# Patient Record
Sex: Female | Born: 1975 | Race: White | Hispanic: No | Smoking: Former smoker
Health system: Southern US, Community
[De-identification: ages and names within clinical notes are randomized; demographics above are authoritative.]

## PROBLEM LIST (undated history)

## (undated) DIAGNOSIS — F32A Depression, unspecified: Secondary | ICD-10-CM

## (undated) DIAGNOSIS — G4730 Sleep apnea, unspecified: Secondary | ICD-10-CM

## (undated) DIAGNOSIS — C439 Malignant melanoma of skin, unspecified: Secondary | ICD-10-CM

## (undated) DIAGNOSIS — E119 Type 2 diabetes mellitus without complications: Secondary | ICD-10-CM

## (undated) DIAGNOSIS — I10 Essential (primary) hypertension: Secondary | ICD-10-CM

## (undated) DIAGNOSIS — F329 Major depressive disorder, single episode, unspecified: Secondary | ICD-10-CM

## (undated) DIAGNOSIS — C801 Malignant (primary) neoplasm, unspecified: Secondary | ICD-10-CM

## (undated) HISTORY — DX: Malignant melanoma of skin, unspecified: C43.9

## (undated) HISTORY — PX: MELANOMA EXCISION WITH SENTINEL LYMPH NODE BIOPSY: SHX5267

## (undated) HISTORY — DX: Type 2 diabetes mellitus without complications: E11.9

## (undated) HISTORY — DX: Sleep apnea, unspecified: G47.30

## (undated) HISTORY — DX: Malignant (primary) neoplasm, unspecified: C80.1

## (undated) HISTORY — PX: CHOLECYSTECTOMY: SHX55

## (undated) MED FILL — Pembrolizumab IV Soln 100 MG/4ML (25 MG/ML): INTRAVENOUS | Qty: 8 | Status: AC

---

## 1999-08-16 ENCOUNTER — Emergency Department (HOSPITAL_COMMUNITY): Admission: EM | Admit: 1999-08-16 | Discharge: 1999-08-16 | Payer: Self-pay | Admitting: *Deleted

## 1999-08-29 ENCOUNTER — Emergency Department (HOSPITAL_COMMUNITY): Admission: EM | Admit: 1999-08-29 | Discharge: 1999-08-29 | Payer: Self-pay | Admitting: *Deleted

## 1999-09-05 ENCOUNTER — Inpatient Hospital Stay (HOSPITAL_COMMUNITY): Admission: EM | Admit: 1999-09-05 | Discharge: 1999-09-05 | Payer: Self-pay | Admitting: Emergency Medicine

## 1999-09-05 ENCOUNTER — Encounter (INDEPENDENT_AMBULATORY_CARE_PROVIDER_SITE_OTHER): Payer: Self-pay

## 2001-08-21 ENCOUNTER — Other Ambulatory Visit: Admission: RE | Admit: 2001-08-21 | Discharge: 2001-08-21 | Payer: Self-pay | Admitting: Obstetrics and Gynecology

## 2002-05-05 ENCOUNTER — Inpatient Hospital Stay (HOSPITAL_COMMUNITY): Admission: AD | Admit: 2002-05-05 | Discharge: 2002-05-05 | Payer: Self-pay | Admitting: Obstetrics and Gynecology

## 2002-07-23 ENCOUNTER — Inpatient Hospital Stay (HOSPITAL_COMMUNITY): Admission: AD | Admit: 2002-07-23 | Discharge: 2002-07-23 | Payer: Self-pay | Admitting: Obstetrics and Gynecology

## 2002-08-01 ENCOUNTER — Inpatient Hospital Stay (HOSPITAL_COMMUNITY): Admission: AD | Admit: 2002-08-01 | Discharge: 2002-08-01 | Payer: Self-pay | Admitting: Obstetrics and Gynecology

## 2002-08-05 ENCOUNTER — Inpatient Hospital Stay (HOSPITAL_COMMUNITY): Admission: AD | Admit: 2002-08-05 | Discharge: 2002-08-08 | Payer: Self-pay | Admitting: Obstetrics & Gynecology

## 2002-09-04 ENCOUNTER — Other Ambulatory Visit: Admission: RE | Admit: 2002-09-04 | Discharge: 2002-09-04 | Payer: Self-pay | Admitting: Obstetrics & Gynecology

## 2002-11-08 ENCOUNTER — Emergency Department (HOSPITAL_COMMUNITY): Admission: EM | Admit: 2002-11-08 | Discharge: 2002-11-08 | Payer: Self-pay | Admitting: Emergency Medicine

## 2002-11-08 ENCOUNTER — Encounter: Payer: Self-pay | Admitting: Emergency Medicine

## 2003-05-08 ENCOUNTER — Encounter: Payer: Self-pay | Admitting: Obstetrics and Gynecology

## 2003-05-08 ENCOUNTER — Ambulatory Visit (HOSPITAL_COMMUNITY): Admission: RE | Admit: 2003-05-08 | Discharge: 2003-05-08 | Payer: Self-pay | Admitting: Obstetrics and Gynecology

## 2003-08-26 ENCOUNTER — Inpatient Hospital Stay (HOSPITAL_COMMUNITY): Admission: AD | Admit: 2003-08-26 | Discharge: 2003-08-27 | Payer: Self-pay | Admitting: Obstetrics and Gynecology

## 2003-09-01 ENCOUNTER — Inpatient Hospital Stay (HOSPITAL_COMMUNITY): Admission: AD | Admit: 2003-09-01 | Discharge: 2003-09-01 | Payer: Self-pay | Admitting: Obstetrics and Gynecology

## 2003-09-02 ENCOUNTER — Ambulatory Visit (HOSPITAL_COMMUNITY): Admission: RE | Admit: 2003-09-02 | Discharge: 2003-09-02 | Payer: Self-pay | Admitting: Obstetrics and Gynecology

## 2003-09-11 ENCOUNTER — Inpatient Hospital Stay (HOSPITAL_COMMUNITY): Admission: AD | Admit: 2003-09-11 | Discharge: 2003-09-13 | Payer: Self-pay | Admitting: Obstetrics & Gynecology

## 2003-10-21 ENCOUNTER — Other Ambulatory Visit: Admission: RE | Admit: 2003-10-21 | Discharge: 2003-10-21 | Payer: Self-pay | Admitting: Obstetrics & Gynecology

## 2007-07-27 ENCOUNTER — Emergency Department (HOSPITAL_COMMUNITY): Admission: EM | Admit: 2007-07-27 | Discharge: 2007-07-27 | Payer: Self-pay | Admitting: Emergency Medicine

## 2008-09-27 ENCOUNTER — Ambulatory Visit: Payer: Self-pay | Admitting: *Deleted

## 2008-09-27 ENCOUNTER — Inpatient Hospital Stay (HOSPITAL_COMMUNITY): Admission: EM | Admit: 2008-09-27 | Discharge: 2008-09-28 | Payer: Self-pay | Admitting: Emergency Medicine

## 2008-10-01 ENCOUNTER — Ambulatory Visit: Payer: Self-pay

## 2008-10-29 ENCOUNTER — Encounter: Payer: Self-pay | Admitting: Cardiovascular Disease

## 2009-05-29 IMAGING — CR DG CHEST 2V
2 series · 2 of 2 positions shown · non-contrast
Comparison: None

CLINICAL DATA: Tightness and entire chest.  Pain in left arm,
fingers of both hands.  The patient is numb and tingling.
Shortness of breath when moving.  History of smoking, hypertension.

CHEST - 2 VIEW

[view not recorded (1 of 2)]
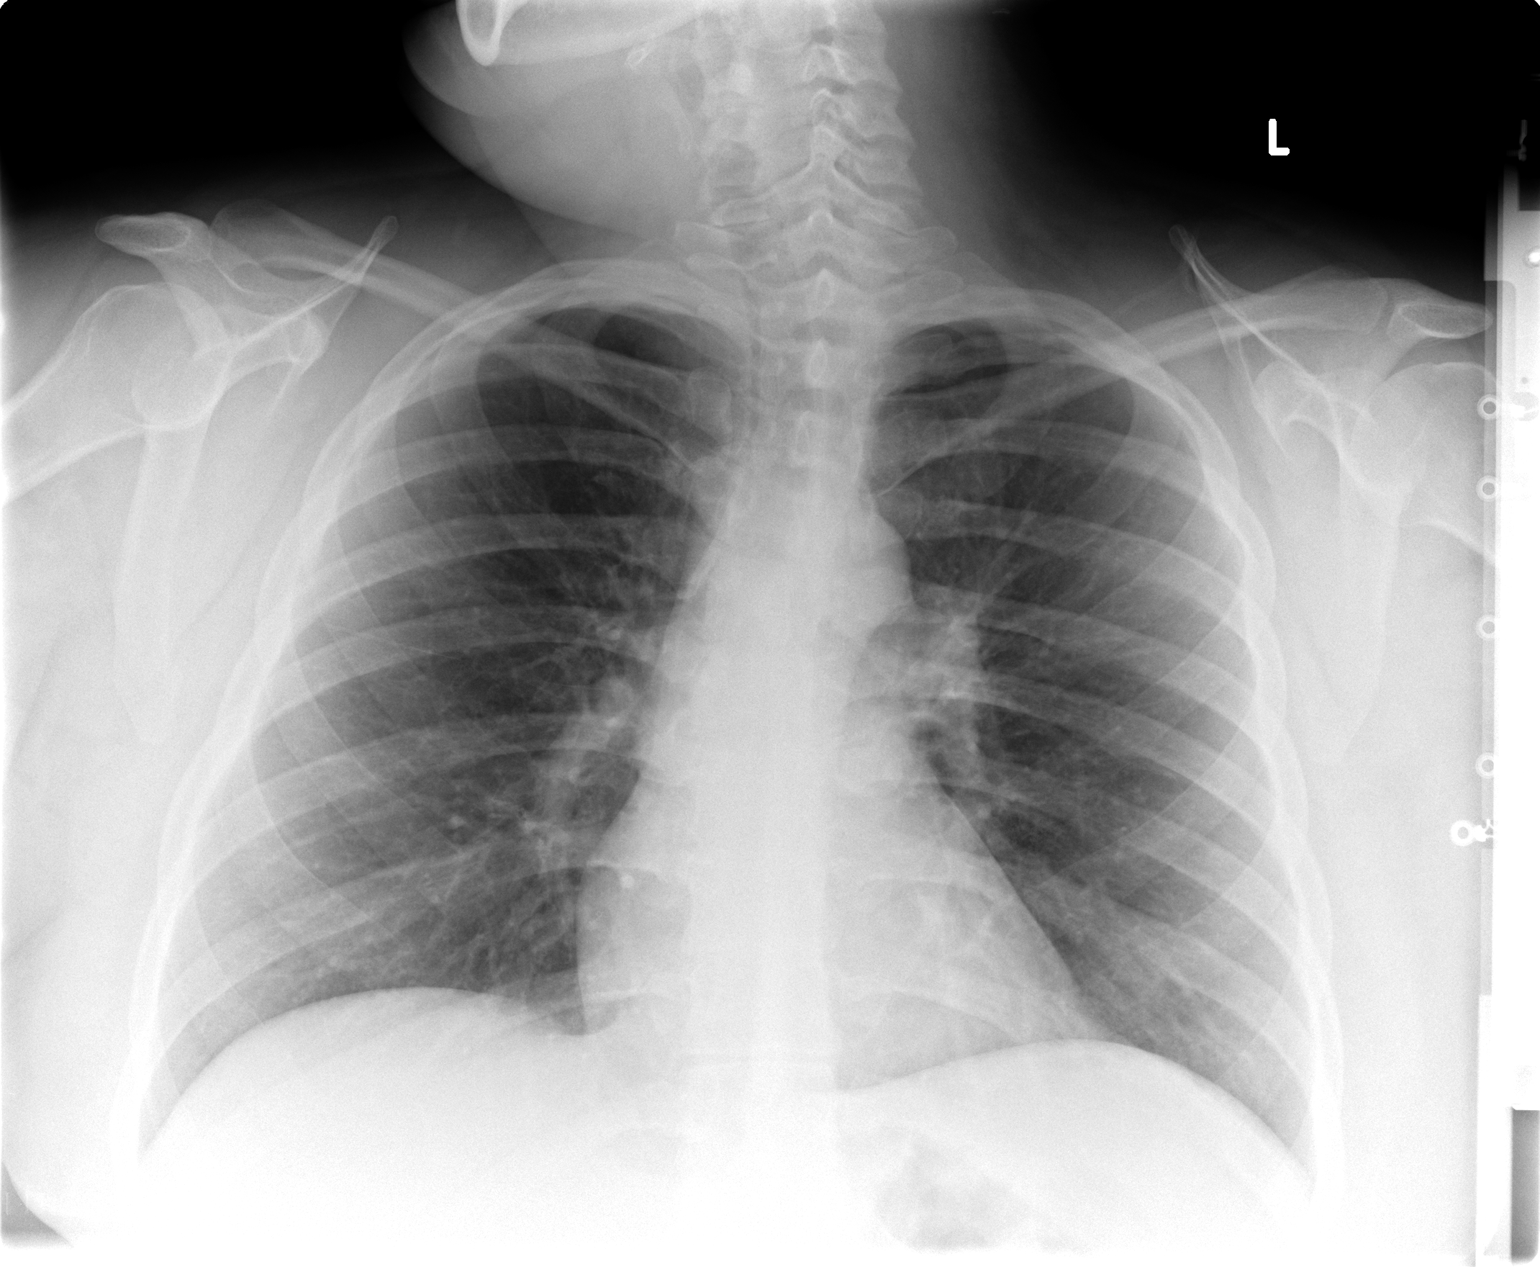

[view not recorded (2 of 2)]
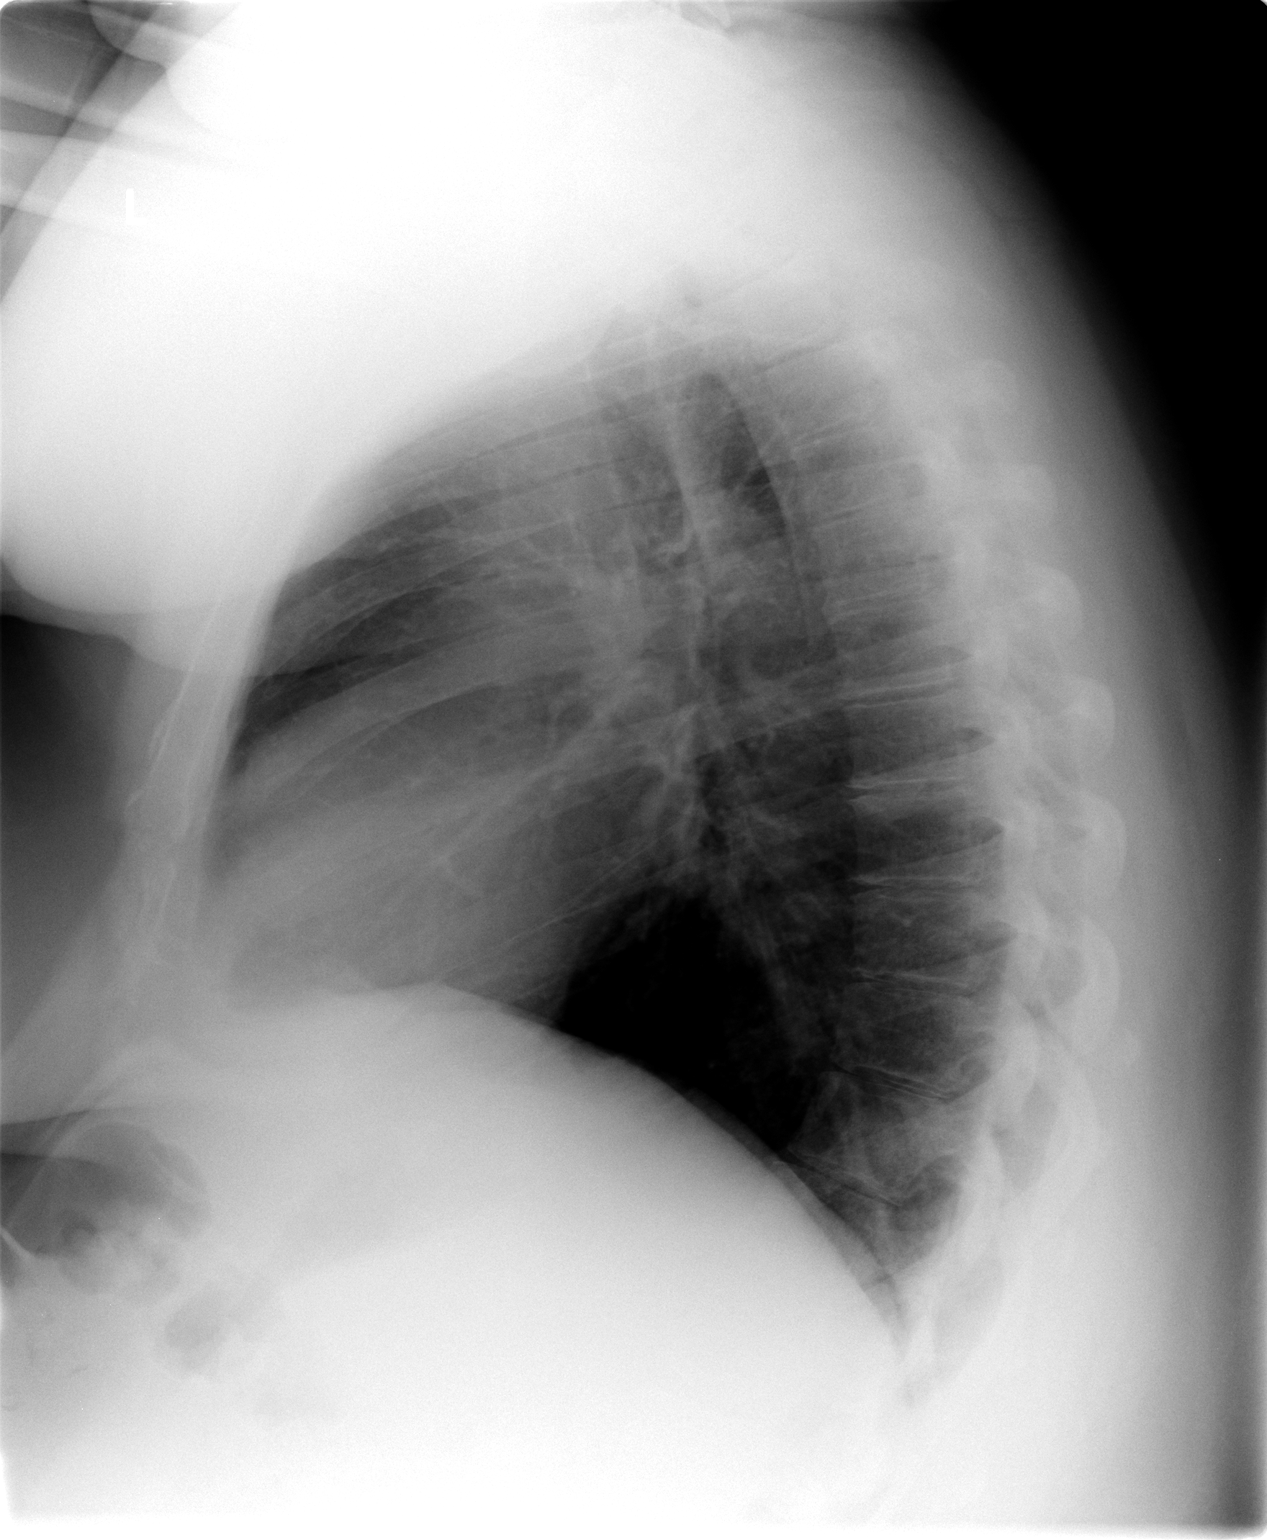

[2 of 2 positions shown; findings below may reference images not displayed]

FINDINGS: Cardiomediastinal silhouette is within normal limits.
The lungs are free of focal consolidations and pleural effusions.
Bony structures have a normal appearance.
IMPRESSION: No evidence for acute abnormality.

## 2010-12-04 ENCOUNTER — Emergency Department (HOSPITAL_COMMUNITY): Payer: Self-pay

## 2010-12-04 ENCOUNTER — Emergency Department (HOSPITAL_COMMUNITY)
Admission: EM | Admit: 2010-12-04 | Discharge: 2010-12-04 | Disposition: A | Discharge status: Home / Self Care | Payer: Self-pay | Attending: Emergency Medicine | Admitting: Emergency Medicine

## 2010-12-04 DIAGNOSIS — R05 Cough: Secondary | ICD-10-CM | POA: Insufficient documentation

## 2010-12-04 DIAGNOSIS — Z79899 Other long term (current) drug therapy: Secondary | ICD-10-CM | POA: Insufficient documentation

## 2010-12-04 DIAGNOSIS — F17200 Nicotine dependence, unspecified, uncomplicated: Secondary | ICD-10-CM | POA: Insufficient documentation

## 2010-12-04 DIAGNOSIS — R209 Unspecified disturbances of skin sensation: Secondary | ICD-10-CM | POA: Insufficient documentation

## 2010-12-04 DIAGNOSIS — I10 Essential (primary) hypertension: Secondary | ICD-10-CM | POA: Insufficient documentation

## 2010-12-04 DIAGNOSIS — R059 Cough, unspecified: Secondary | ICD-10-CM | POA: Insufficient documentation

## 2010-12-04 DIAGNOSIS — R079 Chest pain, unspecified: Secondary | ICD-10-CM | POA: Insufficient documentation

## 2010-12-04 LAB — POCT CARDIAC MARKERS
CKMB, poc: 1.1 ng/mL (ref 1.0–8.0)
Myoglobin, poc: 48.6 ng/mL (ref 12–200)
Troponin i, poc: <0.05 ng/mL (ref 0.00–0.09)

## 2011-01-03 LAB — BASIC METABOLIC PANEL
BUN: 14 mg/dL (ref 6–23)
CO2: 26 mEq/L (ref 19–32)
Chloride: 106 mEq/L (ref 96–112)
Creatinine, Ser: 0.79 mg/dL (ref 0.4–1.2)
Potassium: 4 mEq/L (ref 3.5–5.1)

## 2011-01-03 LAB — CBC
HCT: 36.5 % (ref 36.0–46.0)
HCT: 39.2 % (ref 36.0–46.0)
Hemoglobin: 13.4 g/dL (ref 12.0–15.0)
Platelets: 218 10*3/uL (ref 150–400)
RBC: 3.98 MIL/uL (ref 3.87–5.11)
WBC: 10.2 10*3/uL (ref 4.0–10.5)
WBC: 11.6 10*3/uL — ABNORMAL HIGH (ref 4.0–10.5)

## 2011-01-03 LAB — URINALYSIS, ROUTINE W REFLEX MICROSCOPIC
Glucose, UA: NEGATIVE mg/dL
Hgb urine dipstick: NEGATIVE
Protein, ur: NEGATIVE mg/dL
pH: 5.5 (ref 5.0–8.0)

## 2011-01-03 LAB — COMPREHENSIVE METABOLIC PANEL
AST: 18 U/L (ref 0–37)
Albumin: 3.4 g/dL — ABNORMAL LOW (ref 3.5–5.2)
Alkaline Phosphatase: 90 U/L (ref 39–117)
BUN: 10 mg/dL (ref 6–23)
Chloride: 109 mEq/L (ref 96–112)
Creatinine, Ser: 0.8 mg/dL (ref 0.4–1.2)
GFR calc Af Amer: >60 mL/min (ref >60)
Potassium: 3.7 mEq/L (ref 3.5–5.1)
Total Bilirubin: 0.9 mg/dL (ref 0.3–1.2)
Total Protein: 5.9 g/dL — ABNORMAL LOW (ref 6.0–8.3)

## 2011-01-03 LAB — URINE CULTURE: Colony Count: >=100000

## 2011-01-03 LAB — POCT CARDIAC MARKERS
CKMB, poc: 1 ng/mL (ref 1.0–8.0)
Troponin i, poc: <0.05 ng/mL (ref 0.00–0.09)

## 2011-01-03 LAB — CARDIAC PANEL(CRET KIN+CKTOT+MB+TROPI)
CK, MB: 1.5 ng/mL (ref 0.3–4.0)
CK, MB: 1.8 ng/mL (ref 0.3–4.0)
Relative Index: INVALID (ref 0.0–2.5)
Total CK: 67 U/L (ref 7–177)
Troponin I: 0.01 ng/mL (ref 0.00–0.06)
Troponin I: <0.01 ng/mL (ref 0.00–0.06)

## 2011-01-03 LAB — POCT I-STAT, CHEM 8
Calcium, Ion: 1.07 mmol/L — ABNORMAL LOW (ref 1.12–1.32)
Glucose, Bld: 85 mg/dL (ref 70–99)
HCT: 42 % (ref 36.0–46.0)
Hemoglobin: 14.3 g/dL (ref 12.0–15.0)
TCO2: 26 mmol/L (ref 0–100)

## 2011-01-03 LAB — LIPID PANEL
Triglycerides: 159 mg/dL — ABNORMAL HIGH (ref <150)
VLDL: 32 mg/dL (ref 0–40)

## 2011-01-03 LAB — HEMOGLOBIN A1C: Hgb A1c MFr Bld: 5.2 % (ref 4.6–6.1)

## 2011-01-03 LAB — DIFFERENTIAL
Basophils Absolute: 0.1 10*3/uL (ref 0.0–0.1)
Eosinophils Relative: 2 % (ref 0–5)
Lymphocytes Relative: 21 % (ref 12–46)
Monocytes Absolute: 0.7 10*3/uL (ref 0.1–1.0)
Monocytes Relative: 6 % (ref 3–12)
Neutro Abs: 8.2 10*3/uL — ABNORMAL HIGH (ref 1.7–7.7)

## 2011-01-03 LAB — TSH: TSH: 2.719 u[IU]/mL (ref 0.350–4.500)

## 2011-01-03 LAB — CK TOTAL AND CKMB (NOT AT ARMC)
CK, MB: 1.9 ng/mL (ref 0.3–4.0)
Total CK: 72 U/L (ref 7–177)

## 2011-01-03 LAB — D-DIMER, QUANTITATIVE: D-Dimer, Quant: 0.22 ug/mL-FEU (ref 0.00–0.48)

## 2011-02-01 NOTE — Discharge Summary (Signed)
Diana Odom            ACCOUNT NO.:  [id number]   MEDICAL RECORD NO.:  [id number]          PATIENT TYPE:  INP   LOCATION:  1434                         FACILITY:  [facility]   PHYSICIAN:  Charlestine Massed, MDDATE OF BIRTH:  1976-06-08   DATE OF ADMISSION:  09/27/2008  DATE OF DISCHARGE:  09/28/2008                               DISCHARGE SUMMARY   PRIMARY CARE PHYSICIAN:  Cheri Rous.   CARDIOLOGY:  [facility].   REASON FOR ADMISSION:  Chest pain.   HOSPITAL COURSE:  [name] is a sweet 35 year old Caucasian  female who is morbidly obese and has a history of obstructive sleep  apnea for which she uses her CPAP machine.  She was admitted through the  emergency room for complaints of chest pain which started after she  smoked some marijuana.  The pain was present in the chest, radiating  down the left arm and with difficulty in breathing.  As per the patient,  it was a sharp shooting pain.  It came in episodes and the last episode  lasted for 1-1/2 hours.  The patient has a prior history of panic  episodes and she usually gets some pains in the chest whenever she gets  these panic attacks, but this one, she states, is much worse than it was  before.  She used to smoke cigarettes but she stopped 6 months ago.  Basically, she does not have any significant medical history other than  her morbid obesity but she has a strong family history of coronary  artery disease as her father had an MI in the early 85s and her brother  had a myocardial infarction at the age of 61 for which he had bypass  surgery.  She works as a Leisure centre manager.  She was admitted for chest pain  evaluation.   During her hospital stay her cardiac serial markers were monitored and  so far her cardiac markers have been shown to be negative.  Electrocardiogram did not show any changes suggestive of any acute  myocardial infarction.  Her blood pressure was stable.  A Cardiology  consult was  called who opined that this is an atypical chest pain and  she has a low risk of coronary heart disease at present and if cardiac  markers are negative she can be discharged.   Today, morning, patient was seen and she did not have any further  episodes of chest pain, she had a comfortable sleep, her cardiac markers  are negative and the case was discussed again today with Cardiology they  cleared for the patient to be discharged.  She can follow up with  Cardiology as an outpatient in 1-2 weeks for outpatient stress test.  So, patient is being discharged.   DISCHARGE DIAGNOSES:  1. Atypical chest pain, acute myocardial infarction ruled out.  2. Morbid obesity.  3. Obstructive sleep apnea.  4. Positive family history of coronary artery disease.  5. Dyslipidemia with high triglycerides and low HDL.  6. Metabolic syndrome with a picture of high triglycerides, low HDL,      increased base circumference with morbid obesity.  DISCHARGE MEDICATIONS:  1. Aspirin 81 mg p.o. daily.  2. Zocor 20 mg p.o. daily.   TESTS DONE DURING THIS ADMISSION:  Chest x-ray - Cardiac size is normal,  no pneumonia, no other acute abnormality seen.   LAB TESTS DONE DURING THIS ADMISSION:  The last set of lab tests, the  CBC:  WBC 10.2, hemoglobin 12.4, hematocrit 36.5 and platelet count 280.  BMP:  Showed a sodium of 137, potassium 4.0, chloride 106, bicarbonate  26, glucose 120, BUN 14, creatinine 0.79 and calcium 8.0.  Albumin is  3.4, total protein 5.9.   Lipid profile:  Total cholesterol 122, triglycerides 159, HDL  cholesterol 28, LDL cholesterol 62, VLDL 32.   TSH:  2.719.  Urine pregnancy test:  Negative.   Urinalysis:  Negative for any urinary tract infection.   DISPOSITION:  Patient is discharged back to home.   FOLLOWUP:  1. Follow-up with primary care physician, Dr. Egbert Garibaldi, in 1 week.  2. Follow-up with [facility] as outpatient in 1-2 weeks for      possible outpatient stress  testing.   DISCHARGE SPECIAL INSTRUCTIONS:  Patient has been advised to notice any  muscle aches or body aches as they are happening, as she has been  started on Zocor.  She has been explained about the side effects and if  those symptoms happen she has been advised to see her primary care  doctor immediately and if primary care doctor is not available to go to  the emergency room immediately.   TOTAL TIME SPENT FOR DISCHARGE:  Forty minutes.      Charlestine Massed, MD  Electronically Signed     UT/MEDQ  D:  09/28/2008  T:  09/28/2008  Job:  [id number]   cc:   Cheri Rous  Fax: [phone]   [facility]

## 2011-02-01 NOTE — H&P (Signed)
NAMEGLADA, WICKSTROM NO.:  [id number]   MEDICAL RECORD NO.:  [id number]          PATIENT TYPE:  EMS   LOCATION:  ED                           FACILITY:  [facility]   PHYSICIAN:  Vania Rea, M.D. DATE OF BIRTH:  [birth year]   DATE OF ADMISSION:  09/27/2008  DATE OF DISCHARGE:                              HISTORY & PHYSICAL   PRIMARY CARE PHYSICIAN:  Cheri Rous, M.D.   CHIEF COMPLAINT:  Chest pain.   HISTORY OF PRESENT ILLNESS:  This is a morbidly obese Caucasian lady  with a history of obstructive sleep apnea who does use her CPAP machine  regularly and was in her baseline state of health until last night.  After smoking some marijuana she started experiencing chest pain and  chest pressure radiating down into her left arm associated with nausea  and difficulty breathing.  The patient felt it may have been a panic  attack and ignored it but woke up with her arms feeling numb, went to  work and noticed that the chest pain returned, it was aggravated by  activity and relieved by rest.  Eventually the patient decided to come  to the emergency room 11 hours after intermittent chest pain and  received sublingual nitroglycerin which relieved the pain completely.   The patient does have a history of smoking half a pack of cigarettes per  day but discontinued about 6 months ago.  She does not have a personal  history of hyperlipidemia but she does have a strong family history of  coronary artery disease, her father developed coronary artery disease in  his early 38s.  Family history also significant for hypertension and  diabetes.  The patient had cardiac enzymes which were negative and an  EKG which showed no abnormalities, but because of other abnormal labs,  hospitalist service was called to assist management.   The patient now reports she is essentially pain free but does not feel  quite well.   PAST MEDICAL HISTORY:  1. Obstructive sleep apnea.  2. Morbid  obesity as evidenced by a BMI of almost 50.  3. She is status post cholecystectomy.   MEDICATIONS:  None.   ALLERGIES:  No known drug allergies.   SOCIAL HISTORY:  Discontinued tobacco use 6 months ago, very occasional  alcohol use, marijuana use more frequently, works as a Leisure centre manager.   FAMILY HISTORY:  Significant for diabetes, heart disease and  hypertension.   REVIEW OF SYSTEMS:  Other than noted above, a 10-point review of systems  is unremarkable.   PHYSICAL EXAM:  A young Caucasian lady sitting up in the stretcher.  VITALS:  Her temperature is 98.1, pulse 74, respirations 18, blood  pressure 135/82.  She is saturating at 98% on room air.  Her height she  describes as 5 feet 5 inches and her weight is 300 pounds, giving her a  BMI of 49.9.  Pupils are round and equal.  Mucous membranes pink.  Anicteric.  She has no cervical lymphadenopathy or thyromegaly.  She has  a thick neck.  No obvious jugular venous distention.  No carotid bruit.  CHEST:  Clear to auscultation bilaterally.  Distant heart sounds with  regular rhythm.  ABDOMEN:  Obese, soft and nontender.  EXTREMITIES:  Without edema.  She has 2+ dorsalis pedis pulses  bilaterally.  CENTRAL NERVOUS SYSTEM:  Cranial nerves II-XII are grossly intact and  she has no focal neurologic deficit.   LABS:  Her white count is slightly elevated at 11.6.  Her hemoglobin is  13.4, platelets 233.  She has 71% neutrophils but her absolute  granulocyte count is 8.2.  Serum chemistry is unremarkable.  Cardiac  enzymes are completely normal on 2 successive series.  Troponins are  undetectable and CK-MB did not arise above 1.4, her myoglobin did not  arise above the 50s.  A 2-view chest x-ray shows no acute abnormality.  EKG shows normal sinus rhythm without ST-wave changes.   ASSESSMENT:  Morbidly obese lady with obstructive sleep apnea presenting  with chest pain radiating to the left arm relieved by nitroglycerin,  aggravated by  exertion and relieved by rest.  Technically this is  unstable angina, although the overall picture is more one of anxiety  reaction related to marijuana use.  However, will go ahead and admit her  and give her the benefit of a cardiology evaluation.  Will check other  cardiac risk factors and will consult cardiology for possible stress  testing.  Other plans as per orders.      Vania Rea, M.D.  Electronically Signed     LC/MEDQ  D:  09/27/2008  T:  09/27/2008  Job:  [id number]   cc:   Cheri Rous  Fax: [phone]

## 2011-02-01 NOTE — Consult Note (Signed)
Diana Odom, Diana Odom NO.:  [id number]   MEDICAL RECORD NO.:  [id number]          PATIENT TYPE:  INP   LOCATION:  1434                         FACILITY:  [facility]   PHYSICIAN:  Vernice Jefferson, MD          DATE OF BIRTH:  11/12/1975   DATE OF CONSULTATION:  09/27/2008  DATE OF DISCHARGE:                                 CONSULTATION   REQUESTING PHYSICIAN:  [facility] hospitalist, Vania Rea, MD   CARDIOLOGIST:  The patient does not have one.   REASON FOR CONSULTATION:  This patient is being seen in consultation at  the request of Vania Rea for evaluation of chest pain.   HISTORY OF PRESENTING ILLNESS:  The patient is a 35 year old morbidly  obese white female with a history of obstructive sleep apnea only, is  status post cholecystectomy who comes in with complaints of chest  discomfort.  She reports it had started last night approximately 2 in  the morning, just after she got home from work with radiation down her  left arm.  She reports the pain went away.  She went to sleep  approximately 4 a.m., but felt the mild discomfort throughout the day.  Today, she also reports she had a bilateral arm tingling this morning,  when she woke up from sleep.  Reports that she has had similar  presentation of this chest pain, but not as intense with prior panic  attacks.  She reports that now she has no chest pain and that her chest  pain did not recede with nitroglycerin, but did go away with Morphine.   PAST MEDICAL HISTORY:  1. Obstructive sleep apnea.  2. Morbid obesity with BMI of 50.  3. Status post cholecystectomy.   MEDICATIONS:  None.   ALLERGIES:  No known drug allergies.   SOCIAL HISTORY:  She is a former smoker, but stopped 6 months ago.  Occasional alcohol use.  Positive for marijuana.  Works as a Leisure centre manager.   FAMILY HISTORY:  She has an early heart disease in her brother who had a  open heart surgery at age 15.   REVIEW OF SYSTEMS:  Negative  11-point review of systems except for those  dictated in the above HPI.   PHYSICAL EXAMINATION:  VITAL SIGNS:  Blood pressure is 135/82, heart  rate of 74, and temperature of 98.1.  GENERAL:  A well-developed, well-nourished white female in no acute  distress.  HEENT:  Moist mucous membranes.  Scleral icterus.  Conjunctival pallor.  NECK:  Supple.  Full range of motion.  No jugular venous distention.  CARDIOVASCULAR:  Regular rate and rhythm.  No murmurs, rubs, or gallops.  CHEST:  Clear to auscultation bilaterally.  No wheezes, rales, or  rhonchi.  ABDOMEN:  Soft, nontender, and nondistended with normoactive bowel  sounds.  EXTREMITIES:  No peripheral edema.  Pulses are 2+ bilaterally.  SKIN:  No rashes.  No lesions.  MUSCULOSKELETAL:  No joint tenderness.  No effusions.  NEUROLOGIC:  Cranial nerves II through XII grossly intact.  No focal  neurologic deficits.   EKG demonstrates normal sinus rhythm  with, normal ECG.  Her laboratory  data is significant for hemoglobin of 13.4.  BUN and creatinine within  normal limits and she has 2 normal cardiac biomarkers.  Her chest x-ray  demonstrates no acute infiltrate or process.   IMPRESSION:  1. Atypical chest pain in a lady with very few cardiovascular risk      factors.  2. Morbid obesity.  3. Obstructive sleep apnea.  4. Positive family history for coronary disease.   PLAN:  The patient is currently chest pain free, no high-risk features.  We will reevaluate in the morning, but I think that if she rules out by  biomarkers and is chest pain free in the morning likely she can have an  outpatient stress test, given I think she is at low risk.  We will  follow with you in the morning.      Vernice Jefferson, MD  Electronically Signed     JT/MEDQ  D:  09/27/2008  T:  09/28/2008  Job:  [id number]

## 2011-08-05 IMAGING — CR DG CHEST 2V
2 series · 2 of 2 positions shown · non-contrast
Comparison: Chest x-ray of 09/27/2008

CLINICAL DATA: Elevated blood pressure, short of breath, chest pain
and dizziness

CHEST - 2 VIEW

[w chest pa]
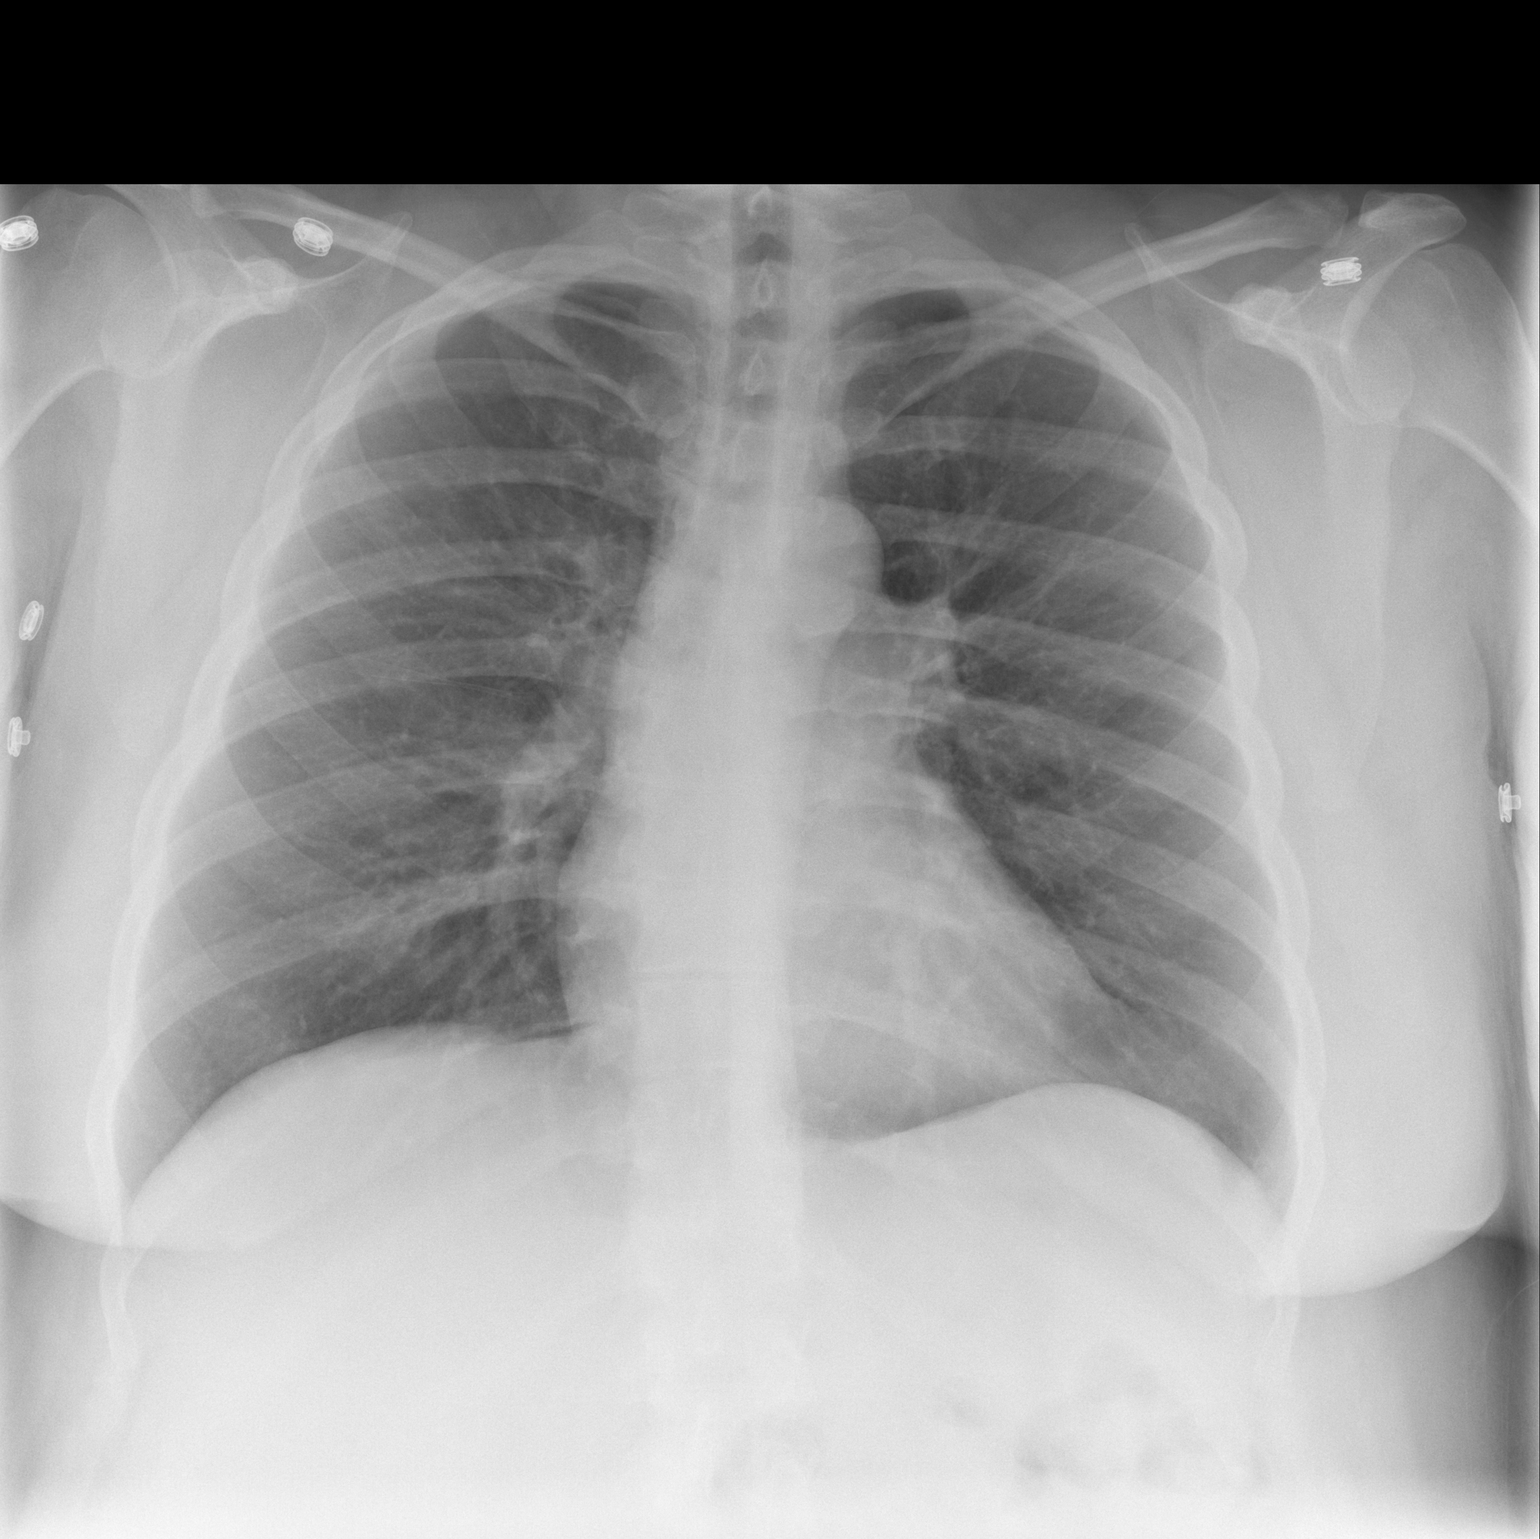

[w chest lat *]
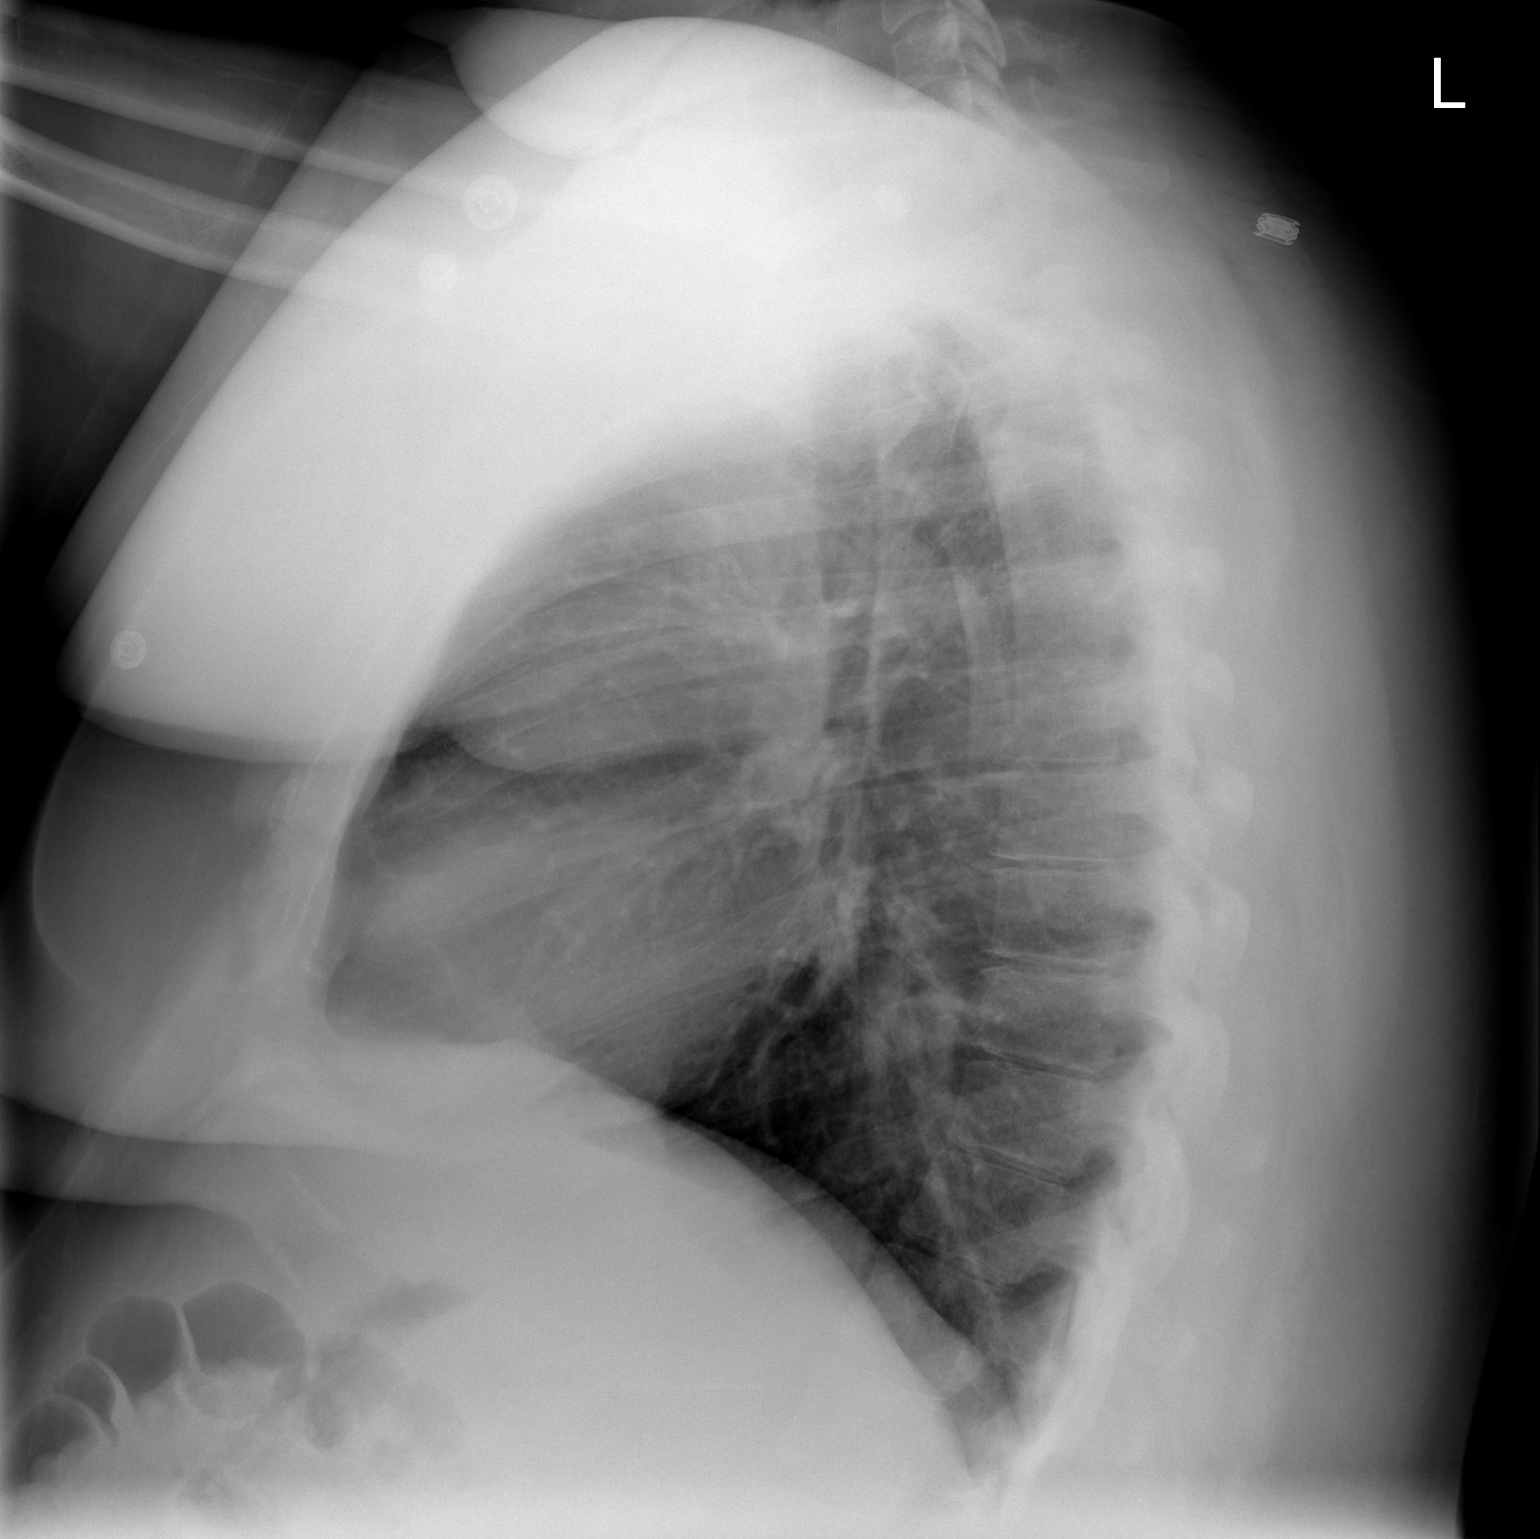

[2 of 2 positions shown; findings below may reference images not displayed]

FINDINGS: No active infiltrate or effusion is seen.  Mediastinal
contours appear stable.  The heart is within normal limits in size.
No bony abnormality is seen.
IMPRESSION: No active lung disease.

## 2014-09-08 ENCOUNTER — Emergency Department (HOSPITAL_BASED_OUTPATIENT_CLINIC_OR_DEPARTMENT_OTHER)
Admission: EM | Admit: 2014-09-08 | Discharge: 2014-09-08 | Disposition: A | Discharge status: Home / Self Care | Payer: BC Managed Care – PPO | Attending: Emergency Medicine | Admitting: Emergency Medicine

## 2014-09-08 ENCOUNTER — Encounter (HOSPITAL_BASED_OUTPATIENT_CLINIC_OR_DEPARTMENT_OTHER): Payer: Self-pay | Admitting: *Deleted

## 2014-09-08 DIAGNOSIS — F329 Major depressive disorder, single episode, unspecified: Secondary | ICD-10-CM | POA: Insufficient documentation

## 2014-09-08 DIAGNOSIS — Z720 Tobacco use: Secondary | ICD-10-CM | POA: Insufficient documentation

## 2014-09-08 DIAGNOSIS — K0889 Other specified disorders of teeth and supporting structures: Secondary | ICD-10-CM

## 2014-09-08 DIAGNOSIS — K047 Periapical abscess without sinus: Secondary | ICD-10-CM | POA: Diagnosis not present

## 2014-09-08 DIAGNOSIS — I10 Essential (primary) hypertension: Secondary | ICD-10-CM | POA: Diagnosis not present

## 2014-09-08 DIAGNOSIS — K088 Other specified disorders of teeth and supporting structures: Secondary | ICD-10-CM | POA: Insufficient documentation

## 2014-09-08 DIAGNOSIS — Z79899 Other long term (current) drug therapy: Secondary | ICD-10-CM | POA: Insufficient documentation

## 2014-09-08 HISTORY — DX: Depression, unspecified: F32.A

## 2014-09-08 HISTORY — DX: Essential (primary) hypertension: I10

## 2014-09-08 HISTORY — DX: Major depressive disorder, single episode, unspecified: F32.9

## 2014-09-08 MED ORDER — AMOXICILLIN 500 MG PO CAPS: 500.0000 mg | ORAL_CAPSULE | Freq: Three times a day (TID) | ORAL | Status: DC

## 2014-09-08 MED ORDER — HYDROCODONE-ACETAMINOPHEN 5-325 MG PO TABS: 1.0000 | ORAL_TABLET | ORAL | Status: DC | PRN

## 2014-09-08 NOTE — ED Notes (Signed)
Toothache since last night

## 2014-09-08 NOTE — Discharge Instructions (Signed)
Take antibiotic amoxicillin as directed for 1 week. Take Vicodin for severe pain only. No driving or operating heavy machinery while taking vicodin. This medication may cause drowsiness. Follow up with your dentist.  Dental Pain A tooth ache may be caused by cavities (tooth decay). Cavities expose the nerve of the tooth to air and hot or cold temperatures. It may come from an infection or abscess (also called a boil or furuncle) around your tooth. It is also often caused by dental caries (tooth decay). This causes the pain you are having. DIAGNOSIS  Your caregiver can diagnose this problem by exam. TREATMENT   If caused by an infection, it may be treated with medications which kill germs (antibiotics) and pain medications as prescribed by your caregiver. Take medications as directed.  Only take over-the-counter or prescription medicines for pain, discomfort, or fever as directed by your caregiver.  Whether the tooth ache today is caused by infection or dental disease, you should see your dentist as soon as possible for further care. SEEK MEDICAL CARE IF: The exam and treatment you received today has been provided on an emergency basis only. This is not a substitute for complete medical or dental care. If your problem worsens or new problems (symptoms) appear, and you are unable to meet with your dentist, call or return to this location. SEEK IMMEDIATE MEDICAL CARE IF:   You have a fever.  You develop redness and swelling of your face, jaw, or neck.  You are unable to open your mouth.  You have severe pain uncontrolled by pain medicine. MAKE SURE YOU:   Understand these instructions.  Will watch your condition.  Will get help right away if you are not doing well or get worse. Document Released: 09/05/2005 Document Revised: 11/28/2011 Document Reviewed: 04/23/2008 [facility] Patient Information 2015 [place], [place]. This information is not intended to replace advice given to you by your  health care provider. Make sure you discuss any questions you have with your health care provider.  Facial Infection You have an infection of your face. This requires special attention to help prevent serious problems. Infections in facial wounds can cause poor healing and scars. They can also spread to deeper tissues, especially around the eye. Wound and dental infections can lead to sinusitis, infection of the eye socket, and even meningitis. Permanent damage to the skin, eye, and nervous system may result if facial infections are not treated properly. With severe infections, hospital care for IV antibiotic injections may be needed if they don't respond to oral antibiotics. Antibiotics must be taken for the full course to insure the infection is eliminated. If the infection came from a bad tooth, it may have to be extracted when the infection is under control. Warm compresses may be applied to reduce skin irritation and remove drainage. You might need a tetanus shot now if:  You cannot remember when your last tetanus shot was.  You have never had a tetanus shot.  The object that caused your wound was dirty. If you need a tetanus shot, and you decide not to get one, there is a rare chance of getting tetanus. Sickness from tetanus can be serious. If you got a tetanus shot, your arm may swell, get red and warm to the touch at the shot site. This is common and not a problem. SEEK IMMEDIATE MEDICAL CARE IF:   You have increased swelling, redness, or trouble breathing.  You have a severe headache, dizziness, nausea, or vomiting.  You develop  problems with your eyesight.  You have a fever. Document Released: 10/13/2004 Document Revised: 11/28/2011 Document Reviewed: 09/05/2005 [facility] Patient Information 2015 [place], [place]. This information is not intended to replace advice given to you by your health care provider. Make sure you discuss any questions you have with your health care provider.

## 2014-09-08 NOTE — ED Provider Notes (Signed)
CSN: [id number]     Arrival date & time 09/08/14  1454 History   First MD Initiated Contact with Patient 09/08/14 1502     Chief Complaint  Patient presents with  . Dental Pain     (Consider location/radiation/quality/duration/timing/severity/associated sxs/prior Treatment) HPI Comments: 38 year old female presenting with left lower toothache beginning yesterday evening. Patient states she is experiencing constant, throbbing pain and feels like her face is swelling. No fevers. Pain worse with chewing. She tried calling the dentist, however they did not have any appointments available for her to be seen. No alleviating factors.  Patient is a 38 y.o. female presenting with tooth pain. The history is provided by the patient.  Dental Pain Associated symptoms: facial swelling   Associated symptoms: no fever     Past Medical History  Diagnosis Date  . Hypertension   . Depression    Past Surgical History  Procedure Laterality Date  . Cholecystectomy     No family history on file. History  Substance Use Topics  . Smoking status: Current Every Day Smoker -- 0.50 packs/day    Types: Cigarettes  . Smokeless tobacco: Not on file  . Alcohol Use: No   OB History    No data available     Review of Systems  Constitutional: Negative for fever.  HENT: Positive for dental problem and facial swelling.   Respiratory: Negative.   Cardiovascular: Negative.   Gastrointestinal: Negative for vomiting.  Musculoskeletal: Negative.   Skin: Negative.   Neurological: Negative.       Allergies  Review of patient's allergies indicates no known allergies.  Home Medications   Prior to Admission medications   Medication Sig Start Date End Date Taking? Authorizing Provider  ALPRAZolam (XANAX PO) Take by mouth.   Yes Historical Provider, MD  AmLODIPine Besylate (NORVASC PO) Take by mouth.   Yes Historical Provider, MD  hydrochlorothiazide (HYDRODIURIL) 25 MG tablet Take 25 mg by mouth daily.    Yes Historical Provider, MD  LISINOPRIL PO Take by mouth.   Yes Historical Provider, MD  Meloxicam (MOBIC PO) Take by mouth.   Yes Historical Provider, MD  Potassium (POTASSIMIN PO) Take by mouth.   Yes Historical Provider, MD  Sertraline HCl (ZOLOFT PO) Take by mouth.   Yes Historical Provider, MD  amoxicillin (AMOXIL) 500 MG capsule Take 1 capsule (500 mg total) by mouth 3 (three) times daily. 09/08/14   Kathrynn Speedobyn M Montana Fassnacht, PA-C  HYDROcodone-acetaminophen (NORCO/VICODIN) 5-325 MG per tablet Take 1 tablet by mouth every 4 (four) hours as needed. 09/08/14   Brayln Duque M Gustin Zobrist, PA-C   BP 155/86 mmHg  Pulse 68  Temp(Src) 97.9 F (36.6 C) (Oral)  Resp 20  Ht 5\' 5"  (1.651 m)  Wt 300 lb (136.079 kg)  BMI 49.92 kg/m2  SpO2 98%  LMP 09/04/2014 Physical Exam  Constitutional: She is oriented to person, place, and time. She appears well-developed and well-nourished. No distress.  HENT:  Head: Normocephalic and atraumatic.  Mouth/Throat: Oropharynx is clear and moist.    Eyes: Conjunctivae and EOM are normal.  Neck: Normal range of motion. Neck supple.  Cardiovascular: Normal rate, regular rhythm and normal heart sounds.   Pulmonary/Chest: Effort normal and breath sounds normal. No respiratory distress.  Musculoskeletal: Normal range of motion. She exhibits no edema.  Lymphadenopathy:       Head (right side): No submental and no submandibular adenopathy present.       Head (left side): No submental and no submandibular adenopathy present.  She has no cervical adenopathy.  Neurological: She is alert and oriented to person, place, and time. No sensory deficit.  Skin: Skin is warm and dry.  Psychiatric: She has a normal mood and affect. Her behavior is normal.  Nursing note and vitals reviewed.   ED Course  Procedures (including critical care time) Labs Review Labs Reviewed - No data to display  Imaging Review No results found.   EKG Interpretation None      MDM   Final diagnoses:    Pain, dental  Dental infection    Dental pain associated with dental infection. No evidence of dental abscess. Patient is afebrile, non toxic appearing and swallowing secretions well. Advised pt to f/u with her dentist for ultimate management of her dental pain. I will also give amoxicillin and pain control. Patient voices understanding and is agreeable to plan.  Kathrynn SpeedRobyn M Larsen Zettel, PA-C 09/08/14 1525  Tilden FossaElizabeth Rees, MD 09/09/14 [phone]

## 2016-04-21 DIAGNOSIS — F17210 Nicotine dependence, cigarettes, uncomplicated: Secondary | ICD-10-CM

## 2016-04-21 DIAGNOSIS — F329 Major depressive disorder, single episode, unspecified: Secondary | ICD-10-CM

## 2016-04-21 DIAGNOSIS — R079 Chest pain, unspecified: Secondary | ICD-10-CM

## 2016-04-21 DIAGNOSIS — I10 Essential (primary) hypertension: Secondary | ICD-10-CM

## 2016-04-22 DIAGNOSIS — R079 Chest pain, unspecified: Secondary | ICD-10-CM

## 2016-04-22 DIAGNOSIS — I10 Essential (primary) hypertension: Secondary | ICD-10-CM

## 2016-04-22 DIAGNOSIS — F329 Major depressive disorder, single episode, unspecified: Secondary | ICD-10-CM

## 2016-04-22 DIAGNOSIS — F17210 Nicotine dependence, cigarettes, uncomplicated: Secondary | ICD-10-CM

## 2016-04-22 DIAGNOSIS — G4730 Sleep apnea, unspecified: Secondary | ICD-10-CM

## 2021-03-24 DIAGNOSIS — I517 Cardiomegaly: Secondary | ICD-10-CM

## 2021-03-25 DIAGNOSIS — R079 Chest pain, unspecified: Secondary | ICD-10-CM

## 2021-10-10 ENCOUNTER — Emergency Department (HOSPITAL_COMMUNITY)
Admission: EM | Admit: 2021-10-10 | Discharge: 2021-10-10 | Disposition: A | Discharge status: Home / Self Care | Payer: 59 | Attending: Emergency Medicine | Admitting: Emergency Medicine

## 2021-10-10 DIAGNOSIS — Z7984 Long term (current) use of oral hypoglycemic drugs: Secondary | ICD-10-CM | POA: Insufficient documentation

## 2021-10-10 DIAGNOSIS — N9489 Other specified conditions associated with female genital organs and menstrual cycle: Secondary | ICD-10-CM | POA: Insufficient documentation

## 2021-10-10 DIAGNOSIS — R197 Diarrhea, unspecified: Secondary | ICD-10-CM | POA: Insufficient documentation

## 2021-10-10 DIAGNOSIS — R1084 Generalized abdominal pain: Secondary | ICD-10-CM | POA: Insufficient documentation

## 2021-10-10 DIAGNOSIS — R739 Hyperglycemia, unspecified: Secondary | ICD-10-CM | POA: Insufficient documentation

## 2021-10-10 DIAGNOSIS — R110 Nausea: Secondary | ICD-10-CM | POA: Diagnosis not present

## 2021-10-10 DIAGNOSIS — E1165 Type 2 diabetes mellitus with hyperglycemia: Secondary | ICD-10-CM | POA: Diagnosis not present

## 2021-10-10 DIAGNOSIS — E876 Hypokalemia: Secondary | ICD-10-CM | POA: Insufficient documentation

## 2021-10-10 LAB — CBC
HCT: 36.7 % (ref 36.0–46.0)
Hemoglobin: 11.5 g/dL — ABNORMAL LOW (ref 12.0–15.0)
MCH: 21.6 pg — ABNORMAL LOW (ref 26.0–34.0)
MCHC: 31.3 g/dL (ref 30.0–36.0)
MCV: 69 fL — ABNORMAL LOW (ref 80.0–100.0)
Platelets: 283 10*3/uL (ref 150–400)
RBC: 5.32 MIL/uL — ABNORMAL HIGH (ref 3.87–5.11)
RDW: 15.9 % — ABNORMAL HIGH (ref 11.5–15.5)
WBC: 7.2 10*3/uL (ref 4.0–10.5)
nRBC: 0 % (ref 0.0–0.2)

## 2021-10-10 LAB — URINALYSIS, ROUTINE W REFLEX MICROSCOPIC
Bilirubin Urine: NEGATIVE
Glucose, UA: >=500 mg/dL — AB
Ketones, ur: NEGATIVE mg/dL
Leukocytes,Ua: NEGATIVE
Nitrite: NEGATIVE
Protein, ur: NEGATIVE mg/dL
RBC / HPF: >50 RBC/hpf — ABNORMAL HIGH (ref 0–5)
Specific Gravity, Urine: 1.006 (ref 1.005–1.030)
pH: 7 (ref 5.0–8.0)

## 2021-10-10 LAB — BASIC METABOLIC PANEL
Anion gap: 8 (ref 5–15)
BUN: 14 mg/dL (ref 6–20)
CO2: 22 mmol/L (ref 22–32)
Calcium: 8.7 mg/dL — ABNORMAL LOW (ref 8.9–10.3)
Chloride: 101 mmol/L (ref 98–111)
Creatinine, Ser: 0.75 mg/dL (ref 0.44–1.00)
GFR, Estimated: >60 mL/min (ref >60)
Glucose, Bld: 268 mg/dL — ABNORMAL HIGH (ref 70–99)
Potassium: 3.2 mmol/L — ABNORMAL LOW (ref 3.5–5.1)
Sodium: 131 mmol/L — ABNORMAL LOW (ref 135–145)

## 2021-10-10 LAB — BETA-HYDROXYBUTYRIC ACID: Beta-Hydroxybutyric Acid: 0.46 mmol/L — ABNORMAL HIGH (ref 0.05–0.27)

## 2021-10-10 LAB — I-STAT BETA HCG BLOOD, ED (MC, WL, AP ONLY): I-stat hCG, quantitative: <5 m[IU]/mL (ref <5)

## 2021-10-10 LAB — I-STAT VENOUS BLOOD GAS, ED
Acid-base deficit: 1 mmol/L (ref 0.0–2.0)
Bicarbonate: 21.8 mmol/L (ref 20.0–28.0)
Calcium, Ion: 1.07 mmol/L — ABNORMAL LOW (ref 1.15–1.40)
HCT: 36 % (ref 36.0–46.0)
Hemoglobin: 12.2 g/dL (ref 12.0–15.0)
O2 Saturation: 78 %
Potassium: 3.4 mmol/L — ABNORMAL LOW (ref 3.5–5.1)
Sodium: 132 mmol/L — ABNORMAL LOW (ref 135–145)
TCO2: 23 mmol/L (ref 22–32)
pCO2, Ven: 28.8 mmHg — ABNORMAL LOW (ref 44.0–60.0)
pH, Ven: 7.487 — ABNORMAL HIGH (ref 7.250–7.430)
pO2, Ven: 39 mmHg (ref 32.0–45.0)

## 2021-10-10 LAB — MAGNESIUM: Magnesium: 1.7 mg/dL (ref 1.7–2.4)

## 2021-10-10 LAB — CBG MONITORING, ED: Glucose-Capillary: 253 mg/dL — ABNORMAL HIGH (ref 70–99)

## 2021-10-10 MED ORDER — ONDANSETRON 4 MG PO TBDP
4.0000 mg | ORAL_TABLET | Freq: Once | ORAL | Status: AC
  Administered 2021-10-10: 4 mg via ORAL

## 2021-10-10 MED ORDER — METFORMIN HCL 1000 MG PO TABS: 1000.0000 mg | ORAL_TABLET | Freq: Two times a day (BID) | ORAL | 1 refills | Status: AC

## 2021-10-10 MED ORDER — POTASSIUM CHLORIDE CRYS ER 20 MEQ PO TBCR: 20.0000 meq | EXTENDED_RELEASE_TABLET | Freq: Every day | ORAL | 1 refills | Status: DC

## 2021-10-10 NOTE — ED Triage Notes (Signed)
Pt arrives POV for eval of abd pain, N/V/D and malaise x 4 days. Pt reports she has been unable to keep her CBG Below 300. Compliant w/ metformin, no insulin at home

## 2021-10-10 NOTE — ED Provider Notes (Signed)
[facility] Provider Note   CSN: [id number] Arrival date & time: 10/10/21  1546     History  Chief Complaint  Patient presents with   Hyperglycemia    Diana Odom is a 46 y.o. female.  HPI She presents for evaluation of  hyperglycemia.  She also reports abdominal pain for 4 days which is generalized.  She has had nausea, and diarrhea.  She started metformin within the last 6 months to treat hyperglycemia.  She takes 500 mg, twice a day.  She is on hydrochlorothiazide as previously had to take potassium but is not currently taking it.  She wants to find a new primary care doctor plans on doing that soon.  She denies fever, chills, blood in stool, focal weakness or paresthesia.    Home Medications Prior to Admission medications   Medication Sig Start Date End Date Taking? Authorizing Provider  hydrochlorothiazide (HYDRODIURIL) 25 MG tablet Take 50 mg by mouth daily.   Yes [provider]  LISINOPRIL PO Take 20 mg by mouth daily.   Yes [provider]  potassium chloride SA (KLOR-CON M) 20 MEQ tablet Take 1 tablet (20 mEq total) by mouth daily. 10/10/21  Yes Mancel BaleWentz, Gamal Todisco, MD  ALPRAZolam (XANAX PO) Take by mouth. Patient not taking: Reported on 10/10/2021    [provider]  AmLODIPine Besylate (NORVASC PO) Take by mouth. Patient not taking: Reported on 10/10/2021    [provider]  amoxicillin (AMOXIL) 500 MG capsule Take 1 capsule (500 mg total) by mouth 3 (three) times daily. Patient not taking: Reported on 10/10/2021 09/08/14   Kathrynn SpeedHess, Robyn M, PA-C  HYDROcodone-acetaminophen (NORCO/VICODIN) 5-325 MG per tablet Take 1 tablet by mouth every 4 (four) hours as needed. Patient not taking: Reported on 10/10/2021 09/08/14   Kathrynn SpeedHess, Robyn M, PA-C  Meloxicam (MOBIC PO) Take by mouth. Patient not taking: Reported on 10/10/2021    [provider]  metFORMIN (GLUCOPHAGE) 1000 MG tablet Take 1 tablet (1,000  mg total) by mouth 2 (two) times daily with a meal. 10/10/21   Mancel BaleWentz, Finlee Concepcion, MD  Potassium (POTASSIMIN PO) Take by mouth. Patient not taking: Reported on 10/10/2021    [provider]  Sertraline HCl (ZOLOFT PO) Take by mouth. Patient not taking: Reported on 10/10/2021    [provider]      Allergies    Patient has no known allergies.    Review of Systems   Review of Systems  All other systems reviewed and are negative.  Physical Exam Updated Vital Signs BP 135/82 (BP Location: Right Arm)    Pulse 99    Temp 98.2 F (36.8 C) (Oral)    Resp 13    Ht 5\' 5"  (1.651 m)    Wt 136 kg    SpO2 95%    BMI 49.89 kg/m  Physical Exam Vitals and nursing note reviewed.  Constitutional:      General: She is not in acute distress.    Appearance: She is well-developed. She is obese. She is not ill-appearing.  HENT:     Head: Normocephalic and atraumatic.     Right Ear: External ear normal.     Left Ear: External ear normal.  Eyes:     Conjunctiva/sclera: Conjunctivae normal.     Pupils: Pupils are equal, round, and reactive to light.  Neck:     Trachea: Phonation normal.  Cardiovascular:     Rate and Rhythm: Normal rate and regular rhythm.  Heart sounds: Normal heart sounds.  Pulmonary:     Effort: Pulmonary effort is normal.     Breath sounds: Normal breath sounds.  Abdominal:     General: There is no distension.     Palpations: Abdomen is soft.     Tenderness: There is no abdominal tenderness.  Musculoskeletal:        General: Normal range of motion.     Cervical back: Normal range of motion and neck supple.  Skin:    General: Skin is warm and dry.  Neurological:     Mental Status: She is alert and oriented to person, place, and time.     Cranial Nerves: No cranial nerve deficit.     Sensory: No sensory deficit.     Motor: No abnormal muscle tone.     Coordination: Coordination normal.  Psychiatric:        Mood and Affect: Mood normal.        Behavior:  Behavior normal.        Thought Content: Thought content normal.        Judgment: Judgment normal.    ED Results / Procedures / Treatments   Labs (all labs ordered are listed, but only abnormal results are displayed) Labs Reviewed  BASIC METABOLIC PANEL - Abnormal; Notable for the following components:      Result Value   Sodium 131 (*)    Potassium 3.2 (*)    Glucose, Bld 268 (*)    Calcium 8.7 (*)    All other components within normal limits  CBC - Abnormal; Notable for the following components:   RBC 5.32 (*)    Hemoglobin 11.5 (*)    MCV 69.0 (*)    MCH 21.6 (*)    RDW 15.9 (*)    All other components within normal limits  URINALYSIS, ROUTINE W REFLEX MICROSCOPIC - Abnormal; Notable for the following components:   Glucose, UA >=500 (*)    Hgb urine dipstick LARGE (*)    RBC / HPF >50 (*)    Bacteria, UA RARE (*)    All other components within normal limits  BETA-HYDROXYBUTYRIC ACID - Abnormal; Notable for the following components:   Beta-Hydroxybutyric Acid 0.46 (*)    All other components within normal limits  CBG MONITORING, ED - Abnormal; Notable for the following components:   Glucose-Capillary 253 (*)    All other components within normal limits  I-STAT VENOUS BLOOD GAS, ED - Abnormal; Notable for the following components:   pH, Ven 7.487 (*)    pCO2, Ven 28.8 (*)    Sodium 132 (*)    Potassium 3.4 (*)    Calcium, Ion 1.07 (*)    All other components within normal limits  MAGNESIUM  I-STAT BETA HCG BLOOD, ED ([facility], [facility], [facility] ONLY)  CBG MONITORING, ED    EKG None  Radiology No results found.  Procedures Procedures    Medications Ordered in ED Medications  ondansetron (ZOFRAN-ODT) disintegrating tablet 4 mg (4 mg Oral Given 10/10/21 1619)    ED Course/ Medical Decision Making/ A&P                           Medical Decision Making She is  Presenting for evaluation of hyperglycemia and nonspecific upper abdominal pain  Problems  Addressed: Hyperglycemia: chronic illness or injury    Details: On low-dose metformin for 6 months since initial diagnosis.  She is following a low carbohydrate diet.  Hypokalemia: acute illness or injury    Details: Incidental finding related to use of diuretic medication  Amount and/or Complexity of Data Reviewed Labs: ordered.    Details: Metabolic panel, CBC, venous blood gas, beta hydroxybutyrate-relatively normal findings with mild elevation of beta hydroxybutyrate and glucose.  Also spilling glucose in the urine.  Venous blood gas is reassuring with normal pH.  His laboratory abnormalities do not require urgent intervention.  Risk Prescription drug management. Risk Details: Patient on low-dose metformin.  She is overweight.  She will be given increased dose of metformin, 1000 mg, twice daily.  She will be given a short-term prescription for potassium with instructions to recheck it in a week or so.  Doubt DKA, significant hypovolemia or metabolic crisis.           Final Clinical Impression(s) / ED Diagnoses Final diagnoses:  Hyperglycemia  Hypokalemia    Rx / DC Orders ED Discharge Orders          Ordered    metFORMIN (GLUCOPHAGE) 1000 MG tablet  2 times daily with meals        10/10/21 1932    potassium chloride SA (KLOR-CON M) 20 MEQ tablet  Daily        10/10/21 1932              Mancel Bale, MD 10/10/21 2039

## 2021-10-10 NOTE — Discharge Instructions (Signed)
We are increasing your metformin to 1000 mg, twice a day.  We also sent a prescription for potassium to your pharmacy.  Please have your blood sugar and potassium checked in a week or so.  Make sure you are drinking a lot of water.  Return here, if needed.

## 2021-10-10 NOTE — ED Provider Triage Note (Signed)
Emergency Medicine Provider Triage Evaluation Note  Diana Odom , a 46 y.o. female  was evaluated in triage.  Pt complains of generalized abdominal pain x4 days.  She has associated nausea, vomiting, diarrhea, generalized fatigue.  She is a recently diagnosed diabetic within the past 6 months.  She takes metformin.  Her most recent CBG was 370.  Has not tried medication for her symptoms.  Denies fever, chills.  Review of Systems  Positive: As per HPI above. Negative: Fever, chills  Physical Exam  BP (!) 138/99 (BP Location: Right Arm)    Pulse (!) 115    Temp 98.2 F (36.8 C) (Oral)    Resp (!) 26    Ht 5\' 5"  (1.651 m)    Wt 136 kg    SpO2 99%    BMI 49.89 kg/m  Gen:   Awake, no distress   Resp:  Normal effort  MSK:   Moves extremities without difficulty  Other:  Diffuse abdominal tenderness to palpation.  Medical Decision Making  Medically screening exam initiated at 4:14 PM.  Appropriate orders placed.  Diana Odom was informed that the remainder of the evaluation will be completed by another provider, this initial triage assessment does not replace that evaluation, and the importance of remaining in the ED until their evaluation is complete.   Fredia Chittenden A, PA-C 10/10/21 1616

## 2021-12-29 DIAGNOSIS — D638 Anemia in other chronic diseases classified elsewhere: Secondary | ICD-10-CM | POA: Diagnosis not present

## 2021-12-29 DIAGNOSIS — N939 Abnormal uterine and vaginal bleeding, unspecified: Secondary | ICD-10-CM | POA: Diagnosis not present

## 2021-12-29 DIAGNOSIS — D649 Anemia, unspecified: Secondary | ICD-10-CM | POA: Diagnosis not present

## 2021-12-29 DIAGNOSIS — I10 Essential (primary) hypertension: Secondary | ICD-10-CM | POA: Diagnosis not present

## 2021-12-29 DIAGNOSIS — E119 Type 2 diabetes mellitus without complications: Secondary | ICD-10-CM | POA: Diagnosis not present

## 2022-10-07 DIAGNOSIS — E1165 Type 2 diabetes mellitus with hyperglycemia: Secondary | ICD-10-CM | POA: Diagnosis not present

## 2022-10-13 DIAGNOSIS — I10 Essential (primary) hypertension: Secondary | ICD-10-CM | POA: Diagnosis not present

## 2022-10-13 DIAGNOSIS — E119 Type 2 diabetes mellitus without complications: Secondary | ICD-10-CM | POA: Diagnosis not present

## 2022-10-13 DIAGNOSIS — R509 Fever, unspecified: Secondary | ICD-10-CM | POA: Diagnosis not present

## 2022-10-13 DIAGNOSIS — Z6841 Body Mass Index (BMI) 40.0 and over, adult: Secondary | ICD-10-CM | POA: Diagnosis not present

## 2022-10-25 DIAGNOSIS — C4371 Malignant melanoma of right lower limb, including hip: Secondary | ICD-10-CM | POA: Diagnosis not present

## 2022-11-15 DIAGNOSIS — Z6841 Body Mass Index (BMI) 40.0 and over, adult: Secondary | ICD-10-CM | POA: Diagnosis not present

## 2022-11-15 DIAGNOSIS — C4371 Malignant melanoma of right lower limb, including hip: Secondary | ICD-10-CM | POA: Insufficient documentation

## 2022-11-23 DIAGNOSIS — I10 Essential (primary) hypertension: Secondary | ICD-10-CM | POA: Diagnosis not present

## 2022-11-23 DIAGNOSIS — E119 Type 2 diabetes mellitus without complications: Secondary | ICD-10-CM | POA: Diagnosis not present

## 2022-11-23 DIAGNOSIS — C4371 Malignant melanoma of right lower limb, including hip: Secondary | ICD-10-CM | POA: Diagnosis not present

## 2022-11-23 DIAGNOSIS — C774 Secondary and unspecified malignant neoplasm of inguinal and lower limb lymph nodes: Secondary | ICD-10-CM | POA: Diagnosis not present

## 2022-11-23 DIAGNOSIS — J45909 Unspecified asthma, uncomplicated: Secondary | ICD-10-CM | POA: Diagnosis not present

## 2022-11-23 DIAGNOSIS — Z7985 Long-term (current) use of injectable non-insulin antidiabetic drugs: Secondary | ICD-10-CM | POA: Diagnosis not present

## 2022-11-23 DIAGNOSIS — G4733 Obstructive sleep apnea (adult) (pediatric): Secondary | ICD-10-CM | POA: Diagnosis not present

## 2022-12-06 DIAGNOSIS — Z08 Encounter for follow-up examination after completed treatment for malignant neoplasm: Secondary | ICD-10-CM | POA: Insufficient documentation

## 2022-12-12 ENCOUNTER — Inpatient Hospital Stay: Payer: 59 | Attending: Oncology | Admitting: Oncology

## 2022-12-12 ENCOUNTER — Telehealth: Payer: Self-pay | Admitting: Oncology

## 2022-12-12 ENCOUNTER — Encounter: Payer: Self-pay | Admitting: Oncology

## 2022-12-12 ENCOUNTER — Inpatient Hospital Stay: Payer: 59

## 2022-12-12 VITALS — BP 151/93 | HR 103 | Temp 98.9°F | Resp 18 | Ht 65.5 in | Wt 298.4 lb

## 2022-12-12 DIAGNOSIS — Z87891 Personal history of nicotine dependence: Secondary | ICD-10-CM

## 2022-12-12 DIAGNOSIS — C4371 Malignant melanoma of right lower limb, including hip: Secondary | ICD-10-CM

## 2022-12-12 DIAGNOSIS — Z08 Encounter for follow-up examination after completed treatment for malignant neoplasm: Secondary | ICD-10-CM

## 2022-12-12 LAB — COMPREHENSIVE METABOLIC PANEL
ALT: 20 U/L (ref 0–44)
AST: 20 U/L (ref 15–41)
Albumin: 3.8 g/dL (ref 3.5–5.0)
Alkaline Phosphatase: 79 U/L (ref 38–126)
Anion gap: 9 (ref 5–15)
BUN: 10 mg/dL (ref 6–20)
CO2: 24 mmol/L (ref 22–32)
Calcium: 8.9 mg/dL (ref 8.9–10.3)
Chloride: 100 mmol/L (ref 98–111)
Creatinine, Ser: 0.75 mg/dL (ref 0.44–1.00)
GFR, Estimated: >60 mL/min (ref >60)
Glucose, Bld: 157 mg/dL — ABNORMAL HIGH (ref 70–99)
Potassium: 3.4 mmol/L — ABNORMAL LOW (ref 3.5–5.1)
Sodium: 133 mmol/L — ABNORMAL LOW (ref 135–145)
Total Bilirubin: 0.6 mg/dL (ref 0.3–1.2)
Total Protein: 6.9 g/dL (ref 6.5–8.1)

## 2022-12-12 LAB — CBC WITH DIFFERENTIAL/PLATELET
Abs Immature Granulocytes: 0.04 10*3/uL (ref 0.00–0.07)
Basophils Absolute: 0.1 10*3/uL (ref 0.0–0.1)
Basophils Relative: 1 %
Eosinophils Absolute: 0.3 10*3/uL (ref 0.0–0.5)
Eosinophils Relative: 2 %
HCT: 39.4 % (ref 36.0–46.0)
Hemoglobin: 13.5 g/dL (ref 12.0–15.0)
Immature Granulocytes: 0 %
Lymphocytes Relative: 25 %
Lymphs Abs: 2.8 10*3/uL (ref 0.7–4.0)
MCH: 28.4 pg (ref 26.0–34.0)
MCHC: 34.3 g/dL (ref 30.0–36.0)
MCV: 82.9 fL (ref 80.0–100.0)
Monocytes Absolute: 0.9 10*3/uL (ref 0.1–1.0)
Monocytes Relative: 8 %
Neutro Abs: 6.9 10*3/uL (ref 1.7–7.7)
Neutrophils Relative %: 64 %
Platelets: 276 10*3/uL (ref 150–400)
RBC: 4.75 MIL/uL (ref 3.87–5.11)
RDW: 14.6 % (ref 11.5–15.5)
WBC: 11.1 10*3/uL — ABNORMAL HIGH (ref 4.0–10.5)
nRBC: 0 % (ref 0.0–0.2)

## 2022-12-12 LAB — LACTATE DEHYDROGENASE: LDH: 123 U/L (ref 98–192)

## 2022-12-12 NOTE — Telephone Encounter (Signed)
12/12/22 Next appt scheduled and confirmed with patient

## 2022-12-12 NOTE — Progress Notes (Signed)
Diana Odom:  Patient Care Team: Diana Shivers, MD as PCP - General (Family Medicine)  CHIEF COMPLAINTS/PURPOSE OF CONSULTATION:  Odom History  Malignant melanoma of right lower leg  11/15/2022 Initial Diagnosis   Malignant melanoma of right lower leg ([place])   12/12/2022 Cancer Staging   Staging form: Melanoma of the Skin, AJCC 8th Edition - Clinical stage from 12/12/2022: Stage III (cT4b, cN1a, cM0) - Signed by Diana Cough, MD on 12/12/2022 Histopathologic type: Nodular melanoma Stage prefix: Initial diagnosis     HISTORY OF PRESENTING ILLNESS: Diana Odom 47 y.o. female is here because of  malignant melanoma Medical history notable for tobacco use, diabetes mellitus type 2, hypertension, OSA on CPAP  October 25, 2022: Presented to dermatology with a red, raised, nodular lesion on right anterior shin which have been present for over a year and a half.  It began as a dark mole and had grown slowly over the period of time.  Patient underwent excisional biopsy followed by electrodesiccation and curettage Pathology demonstrated malignant melanoma at least 5 mm thick.  Deep margin was involved ulceration present mitotic index 5/mm.  Tumor infiltrating lymphocyte not brisk  Patient was subsequently referred to surgery for additional management  November 23, 2022: Wide local excision of malignant melanoma with resulting 6 cm diameter circular wound; right inguinal sentinel lymph node biopsy Pathology from the excisional biopsy site demonstrated residual melanoma in situ, no residual invasive disease, LVI invasion seen, margins free of malignancy.  No change in stage from T4b.  Sentinel lymph node positive for melanoma (3 separate aggregate blocks) no extracapsular extension Pathologic stage IIIC (T4b, N1 M0)  December 12 2022:  Diana Odom Consult  States that her FSGB's run from 185 to 215  Feels down and depressed.   Eating a lot due to her emotions.    Social:  Divorced.  Glass blower/designer at [employer].   Quit smoking since her surgery in March 2024.  Formerly smoked 1 ppd.  EtOH drinks once every few weeks.    Diana Odom Mother died 61 CHF Father died late 7's CHF and COPD Sister alive 80 well Brother alive 52 CAD, DM Type II, COPD    Review of Systems  Constitutional:  Positive for fatigue. Negative for appetite change, chills and fever.       Has been gaining weight due to increased eating  HENT:   Negative for nosebleeds and voice change.        Occasional sores on tongue and sore throat.  Occasional trouble swallowing.   Eyes:  Negative for icterus.       Vision worsened since diagnosed with DM.  Has not seen opthalmologist  Respiratory:  Negative for chest tightness, Odom, hemoptysis, shortness of breath and wheezing.        PND:  none Orthopnea:  none DOE:    Cardiovascular:  Positive for palpitations. Negative for chest pain and leg swelling.       PND:  none Orthopnea:  none  Gastrointestinal:  Negative for abdominal pain, blood in stool, constipation, nausea and vomiting.       Some diarrhea due to metformen   Endocrine: Negative for hot flashes.       Heat intolerance  Genitourinary:  Negative for bladder incontinence, difficulty urinating, dysuria, frequency, hematuria and nocturia.   Musculoskeletal:  Negative for arthralgias, back pain, gait problem, myalgias, neck pain and neck stiffness.       Has  pain at the sentinel node bx site  Skin:  Negative for itching, rash and wound.  Neurological:  Negative for dizziness, extremity weakness, gait problem, light-headedness, numbness and speech difficulty.       Has some stress headaches  Hematological:  Negative for adenopathy. Bruises/bleeds easily.  Psychiatric/Behavioral:  Positive for depression. Negative for suicidal ideas. The patient is not nervous/anxious.     MEDICAL HISTORY: Past Medical History:  Diagnosis Date    Cancer ([place])    Depression    Diabetes mellitus without complication ([place])    Hypertension    Melanoma ([place])    Sleep apnea     SURGICAL HISTORY: Past Surgical History:  Procedure Laterality Date   CHOLECYSTECTOMY     MELANOMA EXCISION WITH SENTINEL LYMPH NODE BIOPSY      SOCIAL HISTORY: Social History   Socioeconomic History   Marital status: Divorced    Spouse name: Not on file   Number of children: 2   Years of education: 12   Highest education level: 12th grade  Occupational History    Comment: Glass blower/designer  Tobacco Use   Smoking status: Former    Packs/day: 0.50    Years: 30.00    Additional pack years: 0.00    Total pack years: 15.00    Types: Cigarettes    Quit date: 11/23/2022    Years since quitting: 0.0   Smokeless tobacco: Not on file  Vaping Use   Vaping Use: Some days   Substances: CBD   Devices: CBD vape occasionally  Substance and Sexual Activity   Alcohol use: Never   Drug use: No    Types: Marijuana    Comment: former marijuana smoker   Sexual activity: Not Currently  Other Topics Concern   Not on file  Social History Narrative   Not on file   Social Determinants of Health   Financial Resource Strain: Not on file  Food Insecurity: No Food Insecurity (12/12/2022)   Hunger Vital Sign    Worried About Running Out of Food in the Last Year: Never true    Ran Out of Food in the Last Year: Never true  Transportation Needs: No Transportation Needs (12/12/2022)   PRAPARE - Hydrologist (Medical): No    Lack of Transportation (Non-Medical): No  Physical Activity: Not on file  Stress: Not on file  Social Connections: Not on file  Intimate Partner Violence: Not At Risk (12/12/2022)   Humiliation, Afraid, Rape, and Kick questionnaire    Fear of Current or Ex-Partner: No    Emotionally Abused: No    Physically Abused: No    Sexually Abused: No    FAMILY HISTORY No family history on file.  ALLERGIES:  has No Known  Allergies.  MEDICATIONS:  Current Outpatient Medications  Medication Sig Dispense Refill   hydrochlorothiazide (HYDRODIURIL) 25 MG tablet Take by mouth.     lisinopril (ZESTRIL) 20 MG tablet Take 1 tablet by mouth daily.     metFORMIN (GLUCOPHAGE) 1000 MG tablet Take 1 tablet (1,000 mg total) by mouth 2 (two) times daily with a meal. 60 tablet 1   metoprolol succinate (TOPROL-XL) 25 MG 24 hr tablet Take by mouth.     TRULICITY [id number] SOPN      No current facility-administered medications for this Odom.    PHYSICAL EXAMINATION:  ECOG PERFORMANCE STATUS: 0 - Asymptomatic   Vitals:   12/12/22 1416  BP: (!) 151/93  Pulse: (!) 103  Resp:  18  Temp: 98.9 F (37.2 C)  SpO2: 96%    Filed Weights   12/12/22 1416  Weight: 298 lb 6.4 oz (135.4 kg)     Physical Exam Vitals and nursing note reviewed.  Constitutional:      Appearance: Normal appearance. She is obese. She is not toxic-appearing or diaphoretic.     Comments: Here with sister  HENT:     Head: Normocephalic and atraumatic.     Right Ear: External ear normal.     Left Ear: External ear normal.     Nose: Nose normal. No congestion or rhinorrhea.  Eyes:     General: No scleral icterus.    Extraocular Movements: Extraocular movements intact.     Conjunctiva/sclera: Conjunctivae normal.     Pupils: Pupils are equal, round, and reactive to light.  Cardiovascular:     Rate and Rhythm: Normal rate.     Heart sounds: No murmur heard.    No friction rub. No gallop.  Abdominal:     General: Bowel sounds are normal.     Palpations: Abdomen is soft.  Musculoskeletal:        General: No swelling, tenderness or deformity.     Cervical back: Normal range of motion and neck supple. No rigidity or tenderness.  Lymphadenopathy:     Head:     Right side of head: No submental, submandibular, tonsillar, preauricular, posterior auricular or occipital adenopathy.     Left side of head: No submental, submandibular,  tonsillar, preauricular, posterior auricular or occipital adenopathy.     Cervical: No cervical adenopathy.     Right cervical: No superficial, deep or posterior cervical adenopathy.    Left cervical: No superficial, deep or posterior cervical adenopathy.     Upper Body:     Right upper body: No supraclavicular, axillary, pectoral or epitrochlear adenopathy.     Left upper body: No supraclavicular, axillary, pectoral or epitrochlear adenopathy.  Skin:    General: Skin is warm and dry.     Coloration: Skin is not jaundiced.     Comments: Flushing of cheeks  Neurological:     General: No focal deficit present.     Mental Status: She is alert and oriented to person, place, and time. Mental status is at baseline.     Cranial Nerves: No cranial nerve deficit.  Psychiatric:        Mood and Affect: Mood normal.        Behavior: Behavior normal.        Thought Content: Thought content normal.        Judgment: Judgment normal.     Comments: Anxious     LABORATORY DATA: I have personally reviewed the data as listed:  Appointment on 12/12/2022  Component Date Value Ref Range Status   LDH 12/12/2022 123  98 - 192 U/L Final   Performed at [facility], [place] [address] [place],  [address]  Office Odom on 12/12/2022  Component Date Value Ref Range Status   WBC 12/12/2022 11.1 (H)  4.0 - 10.5 K/uL Final   RBC 12/12/2022 4.75  3.87 - 5.11 MIL/uL Final   Hemoglobin 12/12/2022 13.5  12.0 - 15.0 g/dL Final   HCT 12/12/2022 39.4  36.0 - 46.0 % Final   MCV 12/12/2022 82.9  80.0 - 100.0 fL Final   MCH 12/12/2022 28.4  26.0 - 34.0 pg Final   MCHC 12/12/2022 34.3  30.0 - 36.0 g/dL Final   RDW 12/12/2022 14.6  11.5 - 15.5 % Final   Platelets 12/12/2022 276  150 - 400 K/uL Final   nRBC 12/12/2022 0.0  0.0 - 0.2 % Final   Neutrophils Relative % 12/12/2022 64  % Final   Neutro Abs 12/12/2022 6.9  1.7 - 7.7 K/uL Final   Lymphocytes Relative 12/12/2022 25  % Final   Lymphs  Abs 12/12/2022 2.8  0.7 - 4.0 K/uL Final   Monocytes Relative 12/12/2022 8  % Final   Monocytes Absolute 12/12/2022 0.9  0.1 - 1.0 K/uL Final   Eosinophils Relative 12/12/2022 2  % Final   Eosinophils Absolute 12/12/2022 0.3  0.0 - 0.5 K/uL Final   Basophils Relative 12/12/2022 1  % Final   Basophils Absolute 12/12/2022 0.1  0.0 - 0.1 K/uL Final   Immature Granulocytes 12/12/2022 0  % Final   Abs Immature Granulocytes 12/12/2022 0.04  0.00 - 0.07 K/uL Final   Performed at [facility], [place] [address], [place], [place] [address]   Sodium 12/12/2022 133 (L)  135 - 145 mmol/L Final   Potassium 12/12/2022 3.4 (L)  3.5 - 5.1 mmol/L Final   Chloride 12/12/2022 100  98 - 111 mmol/L Final   CO2 12/12/2022 24  22 - 32 mmol/L Final   Glucose, Bld 12/12/2022 157 (H)  70 - 99 mg/dL Final   Glucose reference range applies only to samples taken after fasting for at least 8 hours.   BUN 12/12/2022 10  6 - 20 mg/dL Final   Creatinine, Ser 12/12/2022 0.75  0.44 - 1.00 mg/dL Final   Calcium 12/12/2022 8.9  8.9 - 10.3 mg/dL Final   Total Protein 12/12/2022 6.9  6.5 - 8.1 g/dL Final   Albumin 12/12/2022 3.8  3.5 - 5.0 g/dL Final   AST 12/12/2022 20  15 - 41 U/L Final   ALT 12/12/2022 20  0 - 44 U/L Final   Alkaline Phosphatase 12/12/2022 79  38 - 126 U/L Final   Total Bilirubin 12/12/2022 0.6  0.3 - 1.2 mg/dL Final   GFR, Estimated 12/12/2022 >60  >60 mL/min Final   Comment: (NOTE) Calculated using the CKD-EPI Creatinine Equation (2021)    Anion gap 12/12/2022 9  5 - 15 Final   Performed at [facility], [place] [address], [place], [place] [address]    RADIOGRAPHIC STUDIES: I have personally reviewed the radiological images as listed and agree with the findings in the report  No results found.  ASSESSMENT/PLAN 47 y.o. female is here because of  malignant melanoma.  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  Biopsied November 23 2022- Wide local excision and sentinel LN bx.  (Patient did not have clinically apparent LN's by history, nor in transit disease) December 12 2022- Will refer patient for whole body PET/CT to establish a baseline, obtaining CBC with diff, CMP. Obtain BRAF mutation studies on surgical material  Therapeutics   December 12 2022- Discussed use of adjuvant therapy to prevent recurrence.  Awaiting results of PET/CT and BRAF studies to guide decision making    Cancer Staging  Malignant melanoma of right lower leg Staging form: Melanoma of the Skin, AJCC 8th Edition - Clinical stage from 12/12/2022: Stage III (cT4b, cN1a, cM0) - Signed by Diana Cough, MD on 12/12/2022 Histopathologic type:  Nodular melanoma Stage prefix: Initial diagnosis    No problem-specific Assessment & Plan notes found for this encounter.    Orders Placed This Encounter  Procedures   NM PET Image Initial (PI) Whole Body (F-18 FDG)    Standing Status:   Future    Standing Expiration Date:   12/12/2023    Order Specific Question:   If indicated for the ordered procedure, I authorize the administration of a radiopharmaceutical per Radiology protocol    Answer:   Yes    Order Specific Question:   Is the patient pregnant?    Answer:   No    Order Specific Question:   Preferred imaging location?    Answer:   External   MR Brain W Wo Contrast    Standing Status:   Future    Standing Expiration Date:   12/12/2023    Order Specific Question:   If indicated for the ordered procedure, I authorize the administration of contrast media per Radiology protocol    Answer:   Yes    Order Specific Question:   What is the patient's sedation requirement?    Answer:   No Sedation    Order Specific Question:   Does the patient have a pacemaker or  implanted devices?    Answer:   No    Order Specific Question:   Use SRS Protocol?    Answer:   No    Order Specific Question:   Preferred imaging location?    Answer:   External   CBC with Differential/Platelet   Comprehensive metabolic panel   Lactate dehydrogenase    Standing Status:   Future    Number of Occurrences:   1    Standing Expiration Date:   12/12/2023   Nursing communication    Please have BRAF testing done on excisional biopsy Thank you    64  minutes was spent in patient care.  This included time spent preparing to see the patient (e.g., review of tests), obtaining and/or reviewing separately obtained history, counseling and educating the patient/family/caregiver, ordering tests, or procedures; documenting clinical information in the electronic or other health record, independently interpreting results and communicating results to the patient/family/caregiver as well as coordination of care.       All questions were answered. The patient knows to call the clinic with any problems, questions or concerns.  This note was electronically signed.    Diana Cough, MD  12/19/2022 3:13 PM

## 2022-12-15 ENCOUNTER — Telehealth: Payer: Self-pay | Admitting: Oncology

## 2022-12-15 NOTE — Telephone Encounter (Signed)
12/15/22 LVM-MRI scheduled at [facility] on 12/16/22 arrive at 830am.Appt @9am .PET SCAN to follow at 10am

## 2022-12-16 DIAGNOSIS — R918 Other nonspecific abnormal finding of lung field: Secondary | ICD-10-CM | POA: Diagnosis not present

## 2022-12-16 DIAGNOSIS — R9389 Abnormal findings on diagnostic imaging of other specified body structures: Secondary | ICD-10-CM | POA: Diagnosis not present

## 2022-12-16 DIAGNOSIS — L7634 Postprocedural seroma of skin and subcutaneous tissue following other procedure: Secondary | ICD-10-CM | POA: Diagnosis not present

## 2022-12-16 DIAGNOSIS — Z0389 Encounter for observation for other suspected diseases and conditions ruled out: Secondary | ICD-10-CM | POA: Diagnosis not present

## 2022-12-16 DIAGNOSIS — C4371 Malignant melanoma of right lower limb, including hip: Secondary | ICD-10-CM | POA: Diagnosis not present

## 2022-12-16 DIAGNOSIS — Z79899 Other long term (current) drug therapy: Secondary | ICD-10-CM | POA: Diagnosis not present

## 2022-12-20 ENCOUNTER — Inpatient Hospital Stay: Payer: 59 | Attending: Oncology | Admitting: Oncology

## 2022-12-20 ENCOUNTER — Other Ambulatory Visit: Payer: Self-pay | Admitting: Oncology

## 2022-12-20 ENCOUNTER — Encounter: Payer: Self-pay | Admitting: Oncology

## 2022-12-20 VITALS — BP 142/84 | HR 93 | Temp 98.3°F | Resp 16 | Ht 65.5 in | Wt 301.9 lb

## 2022-12-20 DIAGNOSIS — T792XXA Traumatic secondary and recurrent hemorrhage and seroma, initial encounter: Secondary | ICD-10-CM | POA: Diagnosis not present

## 2022-12-20 DIAGNOSIS — Z79899 Other long term (current) drug therapy: Secondary | ICD-10-CM | POA: Insufficient documentation

## 2022-12-20 DIAGNOSIS — Z5112 Encounter for antineoplastic immunotherapy: Secondary | ICD-10-CM | POA: Insufficient documentation

## 2022-12-20 DIAGNOSIS — I878 Other specified disorders of veins: Secondary | ICD-10-CM

## 2022-12-20 DIAGNOSIS — C4371 Malignant melanoma of right lower limb, including hip: Secondary | ICD-10-CM | POA: Insufficient documentation

## 2022-12-20 NOTE — Progress Notes (Signed)
[facility] Cancer Initial Visit:  Patient Care Team: Charlott Rakes, MD as PCP - General (Family Medicine)  CHIEF COMPLAINTS/PURPOSE OF CONSULTATION:  Oncology History  Malignant melanoma of right lower leg  11/15/2022 Initial Diagnosis   Malignant melanoma of right lower leg (HCC)   12/12/2022 Cancer Staging   Staging form: Melanoma of the Skin, AJCC 8th Edition - Clinical stage from 12/12/2022: Stage III (cT4b, cN1a, cM0) - Signed by [name], MD on 12/12/2022 Histopathologic type: Nodular melanoma Stage prefix: Initial diagnosis   01/03/2023 -  Chemotherapy   Patient is on Treatment Plan : MELANOMA Pembrolizumab (200) q21d       HISTORY OF PRESENTING ILLNESS: Diana Odom 47 y.o. female is here because of  malignant melanoma Medical history notable for tobacco use, diabetes mellitus type 2, hypertension, OSA on CPAP  October 25, 2022: Presented to dermatology with a red, raised, nodular lesion on right anterior shin which have been present for over a year and a half.  It began as a dark mole and had grown slowly over the period of time.  Patient underwent excisional biopsy followed by electrodesiccation and curettage Pathology demonstrated malignant melanoma at least 5 mm thick.  Deep margin was involved ulceration present mitotic index 5/mm.  Tumor infiltrating lymphocyte not brisk  Patient was subsequently referred to surgery for additional management  November 23, 2022: Wide local excision of malignant melanoma with resulting 6 cm diameter circular wound; right inguinal sentinel lymph node biopsy Pathology from the excisional biopsy site demonstrated residual melanoma in situ, no residual invasive disease, LVI invasion seen, margins free of malignancy.  No change in stage from T4b.  Sentinel lymph node positive for melanoma (3 separate aggregate blocks) no extracapsular extension Pathologic stage IIIC (T4b, N1 M0)  December 12 2022:  [facility]  States that her FSGB's run from 185 to 215  Feels down and depressed.  Eating a lot due to her emotions.    Social:  Divorced.  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   Quit smoking since her surgery in March 2024.  Formerly smoked 1 ppd.  EtOH drinks once every few weeks.    [facility] Mother died 47 CHF Father died late 61's CHF and COPD Sister alive 77 well Brother alive 71 CAD, DM Type II, COPD   December 16 2022:  PET/CT (Melanoma protocol) No evidence of residual melanoma or metastatic melanoma on whole-body scan.  Postsurgical seroma in the RIGHT inguinal region without  hypermetabolic lymph nodes in the RIGHT groin. Mild metabolic activity associated wide local excisions surgical site RIGHT lower extremity. Two small pulmonary nodules are favored un-related to melanoma.   MRI brain No evidence of intracranial metastatic disease.   December 20 2022:  Scheduled follow up for melanoma.  Reviewed results of imaging and labs with patient and sister.  Discussed role of adjuvant immunotherapy.  Discussed risks and benefits of this.    Review of Systems  Constitutional:  Positive for fatigue. Negative for appetite change, chills and fever.       Has been gaining weight due to increased eating  HENT:   Negative for nosebleeds and voice change.        Occasional sores on tongue and sore throat.  Occasional trouble swallowing.   Eyes:  Negative for icterus.       Vision worsened since diagnosed with DM.  Has not seen opthalmologist  Respiratory:  Negative for chest tightness, cough, hemoptysis, shortness  of breath and wheezing.        PND:  none Orthopnea:  none DOE:    Cardiovascular:  Positive for palpitations. Negative for chest pain and leg swelling.       PND:  none Orthopnea:  none  Gastrointestinal:  Negative for abdominal pain, blood in stool, constipation, nausea and vomiting.       Some diarrhea due to metformen   Endocrine: Negative for hot flashes.       Heat  intolerance  Genitourinary:  Negative for bladder incontinence, difficulty urinating, dysuria, frequency, hematuria and nocturia.   Musculoskeletal:  Negative for arthralgias, back pain, gait problem, myalgias, neck pain and neck stiffness.       Has pain at the sentinel node bx site  Skin:  Negative for itching, rash and wound.  Neurological:  Negative for dizziness, extremity weakness, gait problem, light-headedness, numbness and speech difficulty.       Has some stress headaches  Hematological:  Negative for adenopathy. Bruises/bleeds easily.  Psychiatric/Behavioral:  Positive for depression. Negative for suicidal ideas. The patient is not nervous/anxious.     MEDICAL HISTORY: Past Medical History:  Diagnosis Date   Cancer (HCC)    Depression    Diabetes mellitus without complication (HCC)    Hypertension    Melanoma (HCC)    Sleep apnea     SURGICAL HISTORY: Past Surgical History:  Procedure Laterality Date   CHOLECYSTECTOMY     MELANOMA EXCISION WITH SENTINEL LYMPH NODE BIOPSY      SOCIAL HISTORY: Social History   Socioeconomic History   Marital status: Divorced    Spouse name: Not on file   Number of children: 2   Years of education: 12   Highest education level: 12th grade  Occupational History    Comment: Print production planner  Tobacco Use   Smoking status: Former    Packs/day: 0.50    Years: 30.00    Additional pack years: 0.00    Total pack years: 15.00    Types: Cigarettes    Quit date: 11/23/2022    Years since quitting: 0.0   Smokeless tobacco: Not on file  Vaping Use   Vaping Use: Some days   Substances: CBD   Devices: CBD vape occasionally  Substance and Sexual Activity   Alcohol use: Never   Drug use: No    Types: Marijuana    Comment: former marijuana smoker   Sexual activity: Not Currently  Other Topics Concern   Not on file  Social History Narrative   Not on file   Social Determinants of Health   Financial Resource Strain: Not on file   Food Insecurity: No Food Insecurity (12/12/2022)   Hunger Vital Sign    Worried About Running Out of Food in the Last Year: Never true    Ran Out of Food in the Last Year: Never true  Transportation Needs: No Transportation Needs (12/12/2022)   PRAPARE - Administrator, Civil Service (Medical): No    Lack of Transportation (Non-Medical): No  Physical Activity: Not on file  Stress: Not on file  Social Connections: Not on file  Intimate Partner Violence: Not At Risk (12/12/2022)   Humiliation, Afraid, Rape, and Kick questionnaire    Fear of Current or Ex-Partner: No    Emotionally Abused: No    Physically Abused: No    Sexually Abused: No    FAMILY HISTORY No family history on file.  ALLERGIES:  has No Known Allergies.  MEDICATIONS:  Current Outpatient Medications  Medication Sig Dispense Refill   hydrochlorothiazide (HYDRODIURIL) 25 MG tablet Take by mouth.     lisinopril (ZESTRIL) 20 MG tablet Take 1 tablet by mouth daily.     metFORMIN (GLUCOPHAGE) 1000 MG tablet Take 1 tablet (1,000 mg total) by mouth 2 (two) times daily with a meal. 60 tablet 1   metoprolol succinate (TOPROL-XL) 25 MG 24 hr tablet Take by mouth.     TRULICITY 0.75 MG/0.5ML SOPN      No current facility-administered medications for this visit.    PHYSICAL EXAMINATION:  ECOG PERFORMANCE STATUS: 0 - Asymptomatic   Vitals:   12/20/22 1633  BP: (!) 142/84  Pulse: 93  Resp: 16  Temp: 98.3 F (36.8 C)  SpO2: 96%    Filed Weights   12/20/22 1633  Weight: (!) 301 lb 14.4 oz (136.9 kg)     Physical Exam Vitals and nursing note reviewed.  Constitutional:      Appearance: Normal appearance. She is obese. She is not toxic-appearing or diaphoretic.     Comments: Here with sister  HENT:     Head: Normocephalic and atraumatic.     Right Ear: External ear normal.     Left Ear: External ear normal.     Nose: Nose normal. No congestion or rhinorrhea.  Eyes:     General: No scleral  icterus.    Extraocular Movements: Extraocular movements intact.     Conjunctiva/sclera: Conjunctivae normal.     Pupils: Pupils are equal, round, and reactive to light.  Cardiovascular:     Rate and Rhythm: Normal rate.     Heart sounds: No murmur heard.    No friction rub. No gallop.  Abdominal:     General: Bowel sounds are normal.     Palpations: Abdomen is soft.  Musculoskeletal:        General: No swelling, tenderness or deformity.     Cervical back: Normal range of motion and neck supple. No rigidity or tenderness.  Lymphadenopathy:     Head:     Right side of head: No submental, submandibular, tonsillar, preauricular, posterior auricular or occipital adenopathy.     Left side of head: No submental, submandibular, tonsillar, preauricular, posterior auricular or occipital adenopathy.     Cervical: No cervical adenopathy.     Right cervical: No superficial, deep or posterior cervical adenopathy.    Left cervical: No superficial, deep or posterior cervical adenopathy.     Upper Body:     Right upper body: No supraclavicular, axillary, pectoral or epitrochlear adenopathy.     Left upper body: No supraclavicular, axillary, pectoral or epitrochlear adenopathy.  Skin:    General: Skin is warm and dry.     Coloration: Skin is not jaundiced.     Comments: Flushing of cheeks  Neurological:     General: No focal deficit present.     Mental Status: She is alert and oriented to person, place, and time. Mental status is at baseline.     Cranial Nerves: No cranial nerve deficit.  Psychiatric:        Mood and Affect: Mood normal.        Behavior: Behavior normal.        Thought Content: Thought content normal.        Judgment: Judgment normal.     Comments: Anxious     LABORATORY DATA: I have personally reviewed the data as listed:  Appointment on 12/12/2022  Component Date Value Ref Range  Status   LDH 12/12/2022 123  98 - 192 U/L Final   Performed at [facility], [address] [place], [place] [address]  Office Visit on 12/12/2022  Component Date Value Ref Range Status   WBC 12/12/2022 11.1 (H)  4.0 - 10.5 K/uL Final   RBC 12/12/2022 4.75  3.87 - 5.11 MIL/uL Final   Hemoglobin 12/12/2022 13.5  12.0 - 15.0 g/dL Final   HCT 47/82/9562 39.4  36.0 - 46.0 % Final   MCV 12/12/2022 82.9  80.0 - 100.0 fL Final   MCH 12/12/2022 28.4  26.0 - 34.0 pg Final   MCHC 12/12/2022 34.3  30.0 - 36.0 g/dL Final   RDW 13/04/6577 14.6  11.5 - 15.5 % Final   Platelets 12/12/2022 276  150 - 400 K/uL Final   nRBC 12/12/2022 0.0  0.0 - 0.2 % Final   Neutrophils Relative % 12/12/2022 64  % Final   Neutro Abs 12/12/2022 6.9  1.7 - 7.7 K/uL Final   Lymphocytes Relative 12/12/2022 25  % Final   Lymphs Abs 12/12/2022 2.8  0.7 - 4.0 K/uL Final   Monocytes Relative 12/12/2022 8  % Final   Monocytes Absolute 12/12/2022 0.9  0.1 - 1.0 K/uL Final   Eosinophils Relative 12/12/2022 2  % Final   Eosinophils Absolute 12/12/2022 0.3  0.0 - 0.5 K/uL Final   Basophils Relative 12/12/2022 1  % Final   Basophils Absolute 12/12/2022 0.1  0.0 - 0.1 K/uL Final   Immature Granulocytes 12/12/2022 0  % Final   Abs Immature Granulocytes 12/12/2022 0.04  0.00 - 0.07 K/uL Final   Performed at [facility], [address], [place], [place] [address]   Sodium 12/12/2022 133 (L)  135 - 145 mmol/L Final   Potassium 12/12/2022 3.4 (L)  3.5 - 5.1 mmol/L Final   Chloride 12/12/2022 100  98 - 111 mmol/L Final   CO2 12/12/2022 24  22 - 32 mmol/L Final   Glucose, Bld 12/12/2022 157 (H)  70 - 99 mg/dL Final   Glucose reference range applies only to samples taken after fasting for at least 8 hours.   BUN 12/12/2022 10  6 - 20 mg/dL Final   Creatinine, Ser 12/12/2022 0.75  0.44 - 1.00 mg/dL Final   Calcium 95/28/4132 8.9  8.9 - 10.3 mg/dL Final   Total Protein 44/09/270 6.9  6.5 - 8.1 g/dL Final   Albumin 53/66/4403 3.8  3.5 - 5.0 g/dL Final   AST 47/42/5956 20  15 - 41  U/L Final   ALT 12/12/2022 20  0 - 44 U/L Final   Alkaline Phosphatase 12/12/2022 79  38 - 126 U/L Final   Total Bilirubin 12/12/2022 0.6  0.3 - 1.2 mg/dL Final   GFR, Estimated 12/12/2022 >60  >60 mL/min Final   Comment: (NOTE) Calculated using the CKD-EPI Creatinine Equation (2021)    Anion gap 12/12/2022 9  5 - 15 Final   Performed at [facility], [address] [place], [place] [address]    RADIOGRAPHIC STUDIES: I have personally reviewed the radiological images as listed and agree with the findings in the report  No results found.  ASSESSMENT/PLAN 47 y.o. female is here because of  malignant melanoma.  Medical history notable for tobacco use, diabetes mellitus type 2, hypertension, OSA on CPAP  Malignant melanoma distal right leg, Stage IIIC (T4b, N1 M0):  October 25 2022- Presented with ulcerated lesion of 18 months duration involving right  anterior shin,.  Biopsied November 23 2022- Wide local excision and sentinel LN bx.  (Patient did not have clinically apparent LN's by history, nor in transit disease) December 12 2022- Obtain BRAF mutation studies on surgical material December 16 2022 -PET/CT whole body.  No residual melanoma or metastatic disease.  Two small pulmonary nodules thought unrelated. MRI brain negative  Therapeutics   December 12 2022- Discussed use of adjuvant therapy to prevent recurrence.   December 20 2022- Per NCCN guidelines recommended adjuvant Pembrolizumab  Immunotherapy Risks:  Discussed the potential side effects of chemotherapy with the patient.  These include but are not limited to:  Fatigue, Hair loss, low blood counts (anemia, thrombocytopenia) that may necessitate transfusion, bleeding, infection, nausea/ vomiting/Appetite changes/ Constipation/ Diarrhea, mucositis, Neuropathy/ neurologic problems, skin and nail changes such as dry skin and color change, Urine and bladder changes and kidney problems, weight changes, mood changes, decreased  libido/fertility problems, damage to heart and lungs.  Some of these side effects can be life threatening, may be permanent and can result in hospitalization and/or death.  In shared decision making patient has agreed to proceed with chemotherapy.  Will refer patient for formal chemotherapy teaching.    Poor venous access:  In preparation for chemotherapy we will arrange for port-a-cath placement.  We discussed benefits (more efficient access to administer chemotherapy and supportive care medications/IVF, imaging contrast, decreased risk of tissue damage from medications) and disadvantages (risks of infections, thrombosis, surgical procedure, periodic flushing).  Patient has elected to proceed with port placement.      Post operative seroma-  December 20 2022- Mild and should resolve with conservative management.  A common problem with inguinal lymphadenectomy.  Other risk factors are obesity, DM Type II and tobacco use    Cancer Staging  Malignant melanoma of right lower leg Staging form: Melanoma of the Skin, AJCC 8th Edition - Clinical stage from 12/12/2022: Stage III (cT4b, cN1a, cM0) - Signed by [name], MD on 12/12/2022 Histopathologic type: Nodular melanoma Stage prefix: Initial diagnosis    No problem-specific Assessment & Plan notes found for this encounter.    Orders Placed This Encounter  Procedures   CBC with Differential (Cancer Center Only)    Standing Status:   Future    Standing Expiration Date:   01/03/2024   CMP (Cancer Center only)    Standing Status:   Future    Standing Expiration Date:   01/03/2024   T4    Standing Status:   Future    Standing Expiration Date:   01/03/2024   TSH    Standing Status:   Future    Standing Expiration Date:   01/03/2024   CBC with Differential (Cancer Center Only)    Standing Status:   Future    Standing Expiration Date:   01/24/2024   CMP (Cancer Center only)    Standing Status:   Future    Standing Expiration Date:    01/24/2024   Ambulatory referral to Social Work    Referral Priority:   Routine    Referral Type:   Consultation    Referral Reason:   Specialty Services Required    Number of Visits Requested:   1   Ambulatory referral to General Surgery    Referral Priority:   Routine    Referral Type:   Surgical    Referral Reason:   Specialty Services Required    Requested Specialty:   General Surgery    Number of Visits Requested:  1    45  minutes was spent in patient care.  This included time spent preparing to see the patient (e.g., review of tests), obtaining and/or reviewing separately obtained history, counseling and educating the patient/family/caregiver, ordering tests, or procedures; documenting clinical information in the electronic or other health record, independently interpreting results and communicating results to the patient/family/caregiver as well as coordination of care.       All questions were answered. The patient knows to call the clinic with any problems, questions or concerns.  This note was electronically signed.    [name], MD  12/26/2022 12:15 PM

## 2022-12-20 NOTE — Progress Notes (Signed)
START ON PATHWAY REGIMEN - Melanoma and Other Skin Cancers     A cycle is every 21 days:     Pembrolizumab   **Always confirm dose/schedule in your pharmacy ordering system**  Patient Characteristics: Melanoma, Cutaneous/Unknown Primary, Postoperative without Neoadjuvant Therapy, M0 (Pathologic Staging), Any pT, pN+, BRAF V600 Wild Type / BRAF V600 Results Pending or Unknown Disease Classification: Melanoma Disease Subtype: Cutaneous BRAF V600 Mutation Status: Awaiting BRAF V600 Results Therapeutic Status: Postoperative without Neoadjuvant Therapy, M0 (Pathologic Staging) AJCC T Category: pT4b AJCC N Category: pN1a AJCC M Category: cM0 AJCC 8 Stage Grouping: IIIC Intent of Therapy: Curative Intent, Discussed with Patient

## 2022-12-21 ENCOUNTER — Telehealth: Payer: Self-pay

## 2022-12-21 NOTE — Progress Notes (Signed)
Sent a message to [employer], patient would like to enroll into co-pay assistance for [facility].

## 2022-12-21 NOTE — Telephone Encounter (Signed)
Faxed referral for Port placement to Dr. Noberto Retort

## 2022-12-22 ENCOUNTER — Other Ambulatory Visit: Payer: Self-pay

## 2022-12-26 ENCOUNTER — Ambulatory Visit: Payer: 59 | Admitting: Oncology

## 2022-12-26 ENCOUNTER — Encounter: Payer: Self-pay | Admitting: Oncology

## 2022-12-26 DIAGNOSIS — T792XXA Traumatic secondary and recurrent hemorrhage and seroma, initial encounter: Secondary | ICD-10-CM | POA: Insufficient documentation

## 2022-12-26 DIAGNOSIS — I878 Other specified disorders of veins: Secondary | ICD-10-CM | POA: Insufficient documentation

## 2022-12-27 ENCOUNTER — Ambulatory Visit: Payer: 59 | Admitting: Oncology

## 2022-12-28 ENCOUNTER — Inpatient Hospital Stay: Payer: 59

## 2022-12-28 ENCOUNTER — Other Ambulatory Visit: Payer: Self-pay | Admitting: Hematology and Oncology

## 2022-12-28 ENCOUNTER — Encounter: Payer: Self-pay | Admitting: Licensed Clinical Social Worker

## 2022-12-28 ENCOUNTER — Inpatient Hospital Stay: Payer: 59 | Admitting: Licensed Clinical Social Worker

## 2022-12-28 ENCOUNTER — Inpatient Hospital Stay (HOSPITAL_BASED_OUTPATIENT_CLINIC_OR_DEPARTMENT_OTHER): Payer: 59 | Admitting: Hematology and Oncology

## 2022-12-28 ENCOUNTER — Encounter: Payer: Self-pay | Admitting: Hematology and Oncology

## 2022-12-28 VITALS — BP 130/80 | HR 97 | Temp 98.3°F | Resp 18 | Ht 65.5 in | Wt 303.7 lb

## 2022-12-28 DIAGNOSIS — C4371 Malignant melanoma of right lower limb, including hip: Secondary | ICD-10-CM | POA: Diagnosis not present

## 2022-12-28 DIAGNOSIS — Z79899 Other long term (current) drug therapy: Secondary | ICD-10-CM | POA: Diagnosis not present

## 2022-12-28 DIAGNOSIS — Z5112 Encounter for antineoplastic immunotherapy: Secondary | ICD-10-CM | POA: Diagnosis not present

## 2022-12-28 LAB — CBC WITH DIFFERENTIAL (CANCER CENTER ONLY)
Abs Immature Granulocytes: 0.05 10*3/uL (ref 0.00–0.07)
Basophils Absolute: 0.1 10*3/uL (ref 0.0–0.1)
Basophils Relative: 1 %
Eosinophils Absolute: 0.4 10*3/uL (ref 0.0–0.5)
Eosinophils Relative: 4 %
HCT: 38.2 % (ref 36.0–46.0)
Hemoglobin: 12.8 g/dL (ref 12.0–15.0)
Immature Granulocytes: 1 %
Lymphocytes Relative: 21 %
Lymphs Abs: 2.3 10*3/uL (ref 0.7–4.0)
MCH: 28.7 pg (ref 26.0–34.0)
MCHC: 33.5 g/dL (ref 30.0–36.0)
MCV: 85.7 fL (ref 80.0–100.0)
Monocytes Absolute: 0.6 10*3/uL (ref 0.1–1.0)
Monocytes Relative: 6 %
Neutro Abs: 7.5 10*3/uL (ref 1.7–7.7)
Neutrophils Relative %: 67 %
Platelet Count: 254 10*3/uL (ref 150–400)
RBC: 4.46 MIL/uL (ref 3.87–5.11)
RDW: 14.6 % (ref 11.5–15.5)
WBC Count: 10.9 10*3/uL — ABNORMAL HIGH (ref 4.0–10.5)
nRBC: 0 % (ref 0.0–0.2)

## 2022-12-28 LAB — CMP (CANCER CENTER ONLY)
ALT: 19 U/L (ref 0–44)
AST: 23 U/L (ref 15–41)
Albumin: 3.7 g/dL (ref 3.5–5.0)
Alkaline Phosphatase: 76 U/L (ref 38–126)
Anion gap: 12 (ref 5–15)
BUN: 14 mg/dL (ref 6–20)
CO2: 22 mmol/L (ref 22–32)
Calcium: 8.6 mg/dL — ABNORMAL LOW (ref 8.9–10.3)
Chloride: 99 mmol/L (ref 98–111)
Creatinine: 0.87 mg/dL (ref 0.44–1.00)
GFR, Estimated: >60 mL/min (ref >60)
Glucose, Bld: 279 mg/dL — ABNORMAL HIGH (ref 70–99)
Potassium: 3.6 mmol/L (ref 3.5–5.1)
Sodium: 133 mmol/L — ABNORMAL LOW (ref 135–145)
Total Bilirubin: 0.6 mg/dL (ref 0.3–1.2)
Total Protein: 6.7 g/dL (ref 6.5–8.1)

## 2022-12-28 LAB — HCG, SERUM, QUALITATIVE: Preg, Serum: NEGATIVE

## 2022-12-28 LAB — TSH: TSH: 1.284 u[IU]/mL (ref 0.350–4.500)

## 2022-12-28 MED ORDER — PROCHLORPERAZINE MALEATE 10 MG PO TABS
10.0000 mg | ORAL_TABLET | Freq: Four times a day (QID) | ORAL | 1 refills | Status: DC | PRN
Start: 2022-12-28 — End: 2023-12-08

## 2022-12-28 MED ORDER — ONDANSETRON HCL 8 MG PO TABS
8.0000 mg | ORAL_TABLET | Freq: Three times a day (TID) | ORAL | 1 refills | Status: DC | PRN
Start: 2022-12-28 — End: 2023-12-08

## 2022-12-28 NOTE — Progress Notes (Signed)
[facility] CARE CLINIC CONSULT NOTE [facility] Telephone:([phone]   [phone]   Patient Care Team: Charlott Rakes, MD as PCP - General (Family Medicine)   Name of the patient: Diana Odom  [id number]  June 25, 1976   Date of visit: 12/28/22  I connected with Brianny L Belle on 12/28/22 at  2:30 PM EDT by MyChart virtual visit and verified that I am speaking with the correct person using two identifiers.    I discussed the limitations, risks, security and privacy concerns of performing an evaluation and management service by telemedicine and the availability of in-person appointments. I also discussed with the patient that there may be a patient responsible charge related to this service. The patient expressed understanding and agreed to proceed.    The patient did not have anyone accompanying her today.  Patient's location: Exam area at [address], [place], [place] Provider's location: Provider office at [address] [place], [place]  Diagnosis: Malignant melanoma of the skin  Chief complaint/Reason for visit- Initial Meeting for [facility], preparing for starting chemotherapy  Heme/Onc history:  Oncology History  Malignant melanoma of right lower leg  11/15/2022 Initial Diagnosis   Malignant melanoma of right lower leg (HCC)   12/12/2022 Cancer Staging   Staging form: Melanoma of the Skin, AJCC 8th Edition - Clinical stage from 12/12/2022: Stage III (cT4b, cN1a, cM0) - Signed by [name], MD on 12/12/2022 Histopathologic type: Nodular melanoma Stage prefix: Initial diagnosis   01/03/2023 -  Chemotherapy   Patient is on Treatment Plan : MELANOMA Pembrolizumab (200) q21d       Interval history-  The patient presents to chemo care clinic today for initial meeting in preparation for starting chemotherapy. I introduced the chemo care clinic and we discussed that the role of the clinic is to assist those  who are at an increased risk of emergency room visits and/or complications during the course of chemotherapy treatment. We discussed that the increased risk takes into account factors such as age, performance status, and co-morbidities. We also discussed that for some, this might include barriers to care such as not having a primary care provider, lack of insurance/transportation, or not being able to afford medications. We discussed that the goal of the program is to help prevent unplanned ER visits and help reduce complications during chemotherapy. We do this by discussing specific risk factors to each individual and identifying ways that we can help improve these risk factors and reduce barriers to care.   No Known Allergies  Past Medical History:  Diagnosis Date   Cancer    Depression    Diabetes mellitus without complication    Hypertension    Melanoma    Sleep apnea     Past Surgical History:  Procedure Laterality Date   CHOLECYSTECTOMY     MELANOMA EXCISION WITH SENTINEL LYMPH NODE BIOPSY      Social History   Socioeconomic History   Marital status: Divorced    Spouse name: Not on file   Number of children: 2   Years of education: 12   Highest education level: 12th grade  Occupational History    Comment: Print production planner  Tobacco Use   Smoking status: Former    Packs/day: 0.50    Years: 30.00    Additional pack years: 0.00    Total pack years: 15.00    Types: Cigarettes    Quit date: 11/23/2022    Years since quitting:  0.0   Smokeless tobacco: Not on file  Vaping Use   Vaping Use: Some days   Substances: CBD   Devices: CBD vape occasionally  Substance and Sexual Activity   Alcohol use: Never   Drug use: No    Types: Marijuana    Comment: former marijuana smoker   Sexual activity: Not Currently  Other Topics Concern   Not on file  Social History Narrative   Not on file   Social Determinants of Health   Financial Resource Strain: Not on file  Food  Insecurity: No Food Insecurity (12/12/2022)   Hunger Vital Sign    Worried About Running Out of Food in the Last Year: Never true    Ran Out of Food in the Last Year: Never true  Transportation Needs: No Transportation Needs (12/12/2022)   PRAPARE - Administrator, Civil ServiceTransportation    Lack of Transportation (Medical): No    Lack of Transportation (Non-Medical): No  Physical Activity: Not on file  Stress: Not on file  Social Connections: Not on file  Intimate Partner Violence: Not At Risk (12/12/2022)   Humiliation, Afraid, Rape, and Kick questionnaire    Fear of Current or Ex-Partner: No    Emotionally Abused: No    Physically Abused: No    Sexually Abused: No    No family history on file.   Current Outpatient Medications:    hydrochlorothiazide (HYDRODIURIL) 25 MG tablet, Take by mouth., Disp: , Rfl:    lisinopril (ZESTRIL) 20 MG tablet, Take 1 tablet by mouth daily., Disp: , Rfl:    metFORMIN (GLUCOPHAGE) 1000 MG tablet, Take 1 tablet (1,000 mg total) by mouth 2 (two) times daily with a meal., Disp: 60 tablet, Rfl: 1   metoprolol succinate (TOPROL-XL) 25 MG 24 hr tablet, Take by mouth., Disp: , Rfl:    ondansetron (ZOFRAN) 8 MG tablet, Take 1 tablet (8 mg total) by mouth every 8 (eight) hours as needed for nausea or vomiting., Disp: 30 tablet, Rfl: 1   prochlorperazine (COMPAZINE) 10 MG tablet, Take 1 tablet (10 mg total) by mouth every 6 (six) hours as needed for nausea or vomiting., Disp: 30 tablet, Rfl: 1   TRULICITY 0.75 MG/0.5ML SOPN, , Disp: , Rfl:      Latest Ref Rng & Units 12/12/2022    3:15 PM  CMP  Glucose 70 - 99 mg/dL 102157   BUN 6 - 20 mg/dL 10   Creatinine 7.250.44 - 1.00 mg/dL 3.660.75   Sodium 440135 - 347145 mmol/L 133   Potassium 3.5 - 5.1 mmol/L 3.4   Chloride 98 - 111 mmol/L 100   CO2 22 - 32 mmol/L 24   Calcium 8.9 - 10.3 mg/dL 8.9   Total Protein 6.5 - 8.1 g/dL 6.9   Total Bilirubin 0.3 - 1.2 mg/dL 0.6   Alkaline Phos 38 - 126 U/L 79   AST 15 - 41 U/L 20   ALT 0 - 44 U/L 20        Latest Ref Rng & Units 12/12/2022    3:15 PM  CBC  WBC 4.0 - 10.5 K/uL 11.1   Hemoglobin 12.0 - 15.0 g/dL 42.513.5   Hematocrit 95.636.0 - 46.0 % 39.4   Platelets 150 - 400 K/uL 276     No images are attached to the encounter.  No results found.   Assessment and plan-  The patient is a 47 y.o. female [facility] in preparation for starting chemotherapy for the treatment  of  1. Malignant melanoma of right lower leg   .   Chemo Care Clinic/High Risk for ER/Hospitalization during chemotherapy- We discussed the role of the chemo care clinic and identified patient specific risk factors. I discussed that patient was identified as high risk primarily based on:  Patient has past medical history positive for: Past Medical History:  Diagnosis Date   Cancer    Depression    Diabetes mellitus without complication    Hypertension    Melanoma    Sleep apnea     Patient has past surgical history positive for: Past Surgical History:  Procedure Laterality Date   CHOLECYSTECTOMY     MELANOMA EXCISION WITH SENTINEL LYMPH NODE BIOPSY      Provided general information including the following:  1.  Date of education: 12/28/2022 2.  Physician name: Leatha Gilding, MD 3.  Diagnosis: Malignant melanoma 4.  Stage: III 5.  Cure  6.  Chemotherapy plan including drugs and how often: Pembrolizumab every 3 weeks for 18 doses 7.  Start date: 01/03/2023 8.  Other referrals: None at this time 9.  The patient is to call our office with any questions or concerns.  Our office number [phone], if after hours or on the weekend, call the same number and wait for the answering service.  There is always an oncologist on call. 10.  Medications prescribed: ondansetron, prochlorperazine 11.  The patient has verbalized understanding of the treatment plan and has no barriers to adherence or understanding.   Obtained signed consent from patient.   Discussed symptoms  including:  1.  Low blood counts including white blood cells red blood cells, and platelets.  If experience increased fatigue or abnormal bruising or bleeding, call our office. 2.  Infection including to avoid large crowds, wash hands frequently, and stay away from people who were sick.  If fever develops of 100.4 or higher, call our office. 3.  Mucositis:  Instructions on mouth rinse given (baking soda and salt mixture).  Keep mouth clean.  Use soft bristle toothbrush.  Avoid alcohol containing mouthwash.  If mouth sores develop, call our office. 4.  Nausea/vomiting:  Prescriptions given: ondansetron 8 mg every 8 hours as needed for nausea or vomiting and prochlorperazine 10 mg every 6 hours as needed for nausea or vomiting, may alternate these medications and take around the clock if persistent.  If nausea and vomiting is not controlled, call our office 5.  Diarrhea: Use over-the-counter Imodium.  Call our office if diarrhea is not controlled. 6.  Constipation: Use senna-S, 1 to 2 tablets twice a day.  Call our office if no BM in 2 to 3 days. 7.  Loss of appetite:  Try to eat small meals every 2-3 hours.  Call our office if not able to eat or drink. 8.  Taste changes:  Try zinc 50 mg daily.  If becomes severe call clinic. 9.  Drink 2 to 3 quarts of water per day. Call our office if not able to drink enough for urine to be pale yellow. 10. Avoid alcoholic beverages. 11. Peripheral neuropathy: Call office if numbness or tingling in hands or feet worsens or is suddenly severe. 12. Ringing in the ears or hearing loss.  Call our office if this develops.    The patient was given written information printed from [employer] patient education on individual chemotherapy agents which includes: Name of medications Approved uses Dose and schedule Storage and handling Handling body fluids and waste Drug and food  interactions Possible side effects and management Pregnancy, sexual activity, and  contraception Obtaining medication   Gave information on the supportive care team and how to contact them regarding services.  Discussed advanced directives.  The patient does not have their advanced directives.   We discussed that social determinants of health may have significant impacts on health and outcomes for cancer patients.  Today we discussed specific social determinants of performance status, alcohol use, depression, financial needs, food insecurity, housing, interpersonal violence, social connections, stress, tobacco use, and transportation.    After lengthy discussion the following were identified as areas of need:   Outpatient services: We discussed options including home based and outpatient services, DME, nutrition counseling, and supportive care program. We discussed that patients who participate in regular physical activity report fewer negative impacts of cancer and treatments and report less fatigue.   Financial Concerns: We discussed that living with cancer can create tremendous financial burden.  We discussed options for assistance. I asked that if assistance is needed in affording medications or paying bills to please let us know so that we can provide assistance. We discussed options for food including social services.  Referral to Social work: Introduced Child psychotherapist Mady Haagensen and the services she can provide, such as support with utility bill, cell phone and gas vouchers.  Also introduced [place], LCSW, our licensed clinical social worker who provides counseling as needed.  Support groups: We discussed options for support groups at [facility]. We discussed options for managing stress including healthy eating and exercise, as well as participating in no charge counseling services at the cancer center and support groups.  If these are of interest, patient can notify either myself or primary nursing team.We discussed options for management including  medications.  Transportation: We discussed options for transportation.  The patient will contact our office if she requires assistance with transportation.  Palliative care services: We have palliative care services available in the cancer center to discuss goals of care and advanced care planning.  Please let us know if you have any questions or would like to speak to our palliative care practitioner.  Symptom Management Clinic: We discussed our symptom management clinic which is available for acute concerns while receiving treatment such as nausea, vomiting or diarrhea.  We can be reached via telephone at [phone].  We are available for virtual or in person visits on the same day from 9 to 4 PM Monday through Friday.   She has been referred to [facility], LCSW and will see her today.  She is interested in support groups, and I believe [place] can provide her with this information.  She otherwise denies need for additional services at this time.  She will be followed by Dr. Gerald Dexter clinical team.   Disposition: RTC on 01/05/2023  Visit Diagnosis 1. Malignant melanoma of right lower leg     I discussed the assessment and treatment plan with the patient.  The patient was provided an opportunity to ask questions and all were answered. The patient expressed understanding and was in agreement with this plan. She also understands that she can call clinic at any time with any questions, concerns, or complaints.   I provided 14 minutes of non face-to-face telephone visit time during this encounter, and > 50% was spent counseling as documented under my assessment & plan.     Khristi Schiller A. Sol Passer, PA-C [facility] [phone]

## 2022-12-28 NOTE — Progress Notes (Signed)
[facility] Clinical Social Work  Initial Assessment   Diana Odom is a 47 y.o. year old female seen in [facility] . Clinical Social Work was referred by medical provider for assessment of psychosocial needs.   SDOH (Social Determinants of Health) assessments performed: Yes SDOH Interventions    Flowsheet Row Clinical Support from 12/28/2022 in [facility] Visit from 12/12/2022 in [facility] at [facility]  SDOH Interventions    Food Insecurity Interventions -- Intervention Not Indicated  Housing Interventions -- Intervention Not Indicated  Transportation Interventions -- Intervention Not Indicated  Utilities Interventions -- Intervention Not Indicated  Alcohol Usage Interventions -- Intervention Not Indicated (Score <7)  Financial Strain Interventions Artist, Other (Comment)  [referral to grant fund manager] --  Physical Activity Interventions Intervention Not Indicated --  Stress Interventions Provide Counseling --  Social Connections Interventions Intervention Not Indicated --       SDOH Screenings   Food Insecurity: No Food Insecurity (12/12/2022)  Housing: Low Risk  (12/12/2022)  Transportation Needs: No Transportation Needs (12/12/2022)  Utilities: Not At Risk (12/12/2022)  Alcohol Screen: Low Risk  (12/12/2022)  Depression (PHQ2-9): Low Risk  (12/12/2022)  Financial Resource Strain: Medium Risk (12/28/2022)  Physical Activity: Inactive (12/28/2022)  Social Connections: Moderately Isolated (12/28/2022)  Stress: Stress Concern Present (12/28/2022)  Tobacco Use: Medium Risk (12/28/2022)     Distress Screen completed: No    12/12/2022    2:22 PM  ONCBCN DISTRESS SCREENING  Screening Type Initial Screening  Distress experienced in past week (1-10) 0      Family/Social Information:  Housing Arrangement: patient lives with sister,  Dewitt Hoes [phone]  Family members/support persons in your life?  Family, Friends, Acupuncturist, and Ambulance person concerns: no  Employment: Working full time Retail banker.  Income source: Employment Financial concerns: Yes, due to illness and/or loss of work during treatment Type of concern: Medical bills Food access concerns: no Religious or spiritual practice: Not known Services Currently in place:  [employer] [facility]   Coping/ Adjustment to diagnosis: Patient understands treatment plan and what happens next? yes Concerns about diagnosis and/or treatment: Pain or discomfort during procedures, Losing my job and/or losing income, Overwhelmed by information, How I will pay for the services I need, and How will I care for myself Patient reported stressors: Finances, Children, Anxiety/ nervousness, and Adjusting to my illness Hopes and/or priorities: to continue working during treatment Patient enjoys time with family/ friends Current coping skills/ strengths: Ability for insight , Active sense of humor , Average or above average intelligence , Capable of independent living , Manufacturing systems engineer , Contractor , Motivation for treatment/growth , Physical Health , Special hobby/interest , Supportive family/friends , and Work skills     SUMMARY: Current SDOH Barriers:  Limited social support and financial concerns due to medical expenses  Clinical Social Work Clinical Goal(s):  Patient will work with SW to address concerns related to stress and adjustment to diagnosis  Interventions: Discussed common feeling and emotions when being diagnosed with cancer, and the importance of support during treatment Informed patient of the support team roles and support services at [facility] Provided CSW contact information and encouraged patient to call with any questions or concerns Referred patient to financial navigator, Audiological scientist and Provided patient with information about CSW role in patient care and other available resources.   Follow  Up Plan: CSW will see patient on 01/06/2023 @ 2:00PM MyChart virtual  Patient verbalizes understanding of plan: Yes    [employer], LCSW

## 2022-12-28 NOTE — Progress Notes (Signed)
[facility] Clinical Social Work  Clinical Social Work was referred by medical provider for assessment of psychosocial needs.  Clinical Social Worker contacted patient by phone  to offer support and assess for needs.  Patient's connection was not strong and we were unable to speak.  CSW called back and call went to voicemail.  CSW left voicemail with contact information and request for return call.    FA   Joseph Art, LCSW  Clinical Social Worker [facility]

## 2022-12-30 ENCOUNTER — Other Ambulatory Visit: Payer: Self-pay

## 2022-12-30 LAB — T4: T4, Total: 8.9 ug/dL (ref 4.5–12.0)

## 2023-01-02 MED FILL — Pembrolizumab IV Soln 100 MG/4ML (25 MG/ML): INTRAVENOUS | Qty: 8 | Status: AC

## 2023-01-02 NOTE — Progress Notes (Signed)
Spoke with Diana Odom by phone, we will meet on 04/18 at her next appointment to get her enrolled into the [employer].

## 2023-01-03 ENCOUNTER — Ambulatory Visit: Payer: 59

## 2023-01-03 ENCOUNTER — Inpatient Hospital Stay: Payer: 59

## 2023-01-03 VITALS — BP 138/76 | HR 84 | Temp 98.3°F | Resp 16 | Ht 65.5 in | Wt 306.2 lb

## 2023-01-03 DIAGNOSIS — F4322 Adjustment disorder with anxiety: Secondary | ICD-10-CM | POA: Diagnosis not present

## 2023-01-03 DIAGNOSIS — F17200 Nicotine dependence, unspecified, uncomplicated: Secondary | ICD-10-CM | POA: Diagnosis not present

## 2023-01-03 DIAGNOSIS — Z5112 Encounter for antineoplastic immunotherapy: Secondary | ICD-10-CM | POA: Diagnosis not present

## 2023-01-03 DIAGNOSIS — Z1331 Encounter for screening for depression: Secondary | ICD-10-CM | POA: Diagnosis not present

## 2023-01-03 DIAGNOSIS — Z79899 Other long term (current) drug therapy: Secondary | ICD-10-CM | POA: Diagnosis not present

## 2023-01-03 DIAGNOSIS — Z6841 Body Mass Index (BMI) 40.0 and over, adult: Secondary | ICD-10-CM | POA: Diagnosis not present

## 2023-01-03 DIAGNOSIS — C4371 Malignant melanoma of right lower limb, including hip: Secondary | ICD-10-CM

## 2023-01-03 DIAGNOSIS — T502X5A Adverse effect of carbonic-anhydrase inhibitors, benzothiadiazides and other diuretics, initial encounter: Secondary | ICD-10-CM | POA: Diagnosis not present

## 2023-01-03 DIAGNOSIS — E876 Hypokalemia: Secondary | ICD-10-CM | POA: Diagnosis not present

## 2023-01-03 MED ORDER — SODIUM CHLORIDE 0.9 % IV SOLN
200.0000 mg | Freq: Once | INTRAVENOUS | Status: AC
  Administered 2023-01-03: 200 mg via INTRAVENOUS
  Filled 2023-01-03: qty 8

## 2023-01-03 MED ORDER — SODIUM CHLORIDE 0.9 % IV SOLN: Freq: Once | INTRAVENOUS | Status: AC

## 2023-01-03 NOTE — Patient Instructions (Signed)
[facility] [place]  Discharge Instructions: Thank you for choosing [facility] to provide your oncology and hematology care.  If you have a lab appointment with the Cancer Center, please go directly to the Cancer Center and check in at the registration area.   Wear comfortable clothing and clothing appropriate for easy access to any Portacath or PICC line.   We strive to give you quality time with your provider. You may need to reschedule your appointment if you arrive late (15 or more minutes).  Arriving late affects you and other patients whose appointments are after yours.  Also, if you miss three or more appointments without notifying the office, you may be dismissed from the clinic at the provider's discretion.      For prescription refill requests, have your pharmacy contact our office and allow 72 hours for refills to be completed.    Today you received the following chemotherapy and/or immunotherapy agents keytruda   To help prevent nausea and vomiting after your treatment, we encourage you to take your nausea medication as directed.  BELOW ARE SYMPTOMS THAT SHOULD BE REPORTED IMMEDIATELY: *FEVER GREATER THAN 100.4 F (38 C) OR HIGHER *CHILLS OR SWEATING *NAUSEA AND VOMITING THAT IS NOT CONTROLLED WITH YOUR NAUSEA MEDICATION *UNUSUAL SHORTNESS OF BREATH *UNUSUAL BRUISING OR BLEEDING *URINARY PROBLEMS (pain or burning when urinating, or frequent urination) *BOWEL PROBLEMS (unusual diarrhea, constipation, pain near the anus) TENDERNESS IN MOUTH AND THROAT WITH OR WITHOUT PRESENCE OF ULCERS (sore throat, sores in mouth, or a toothache) UNUSUAL RASH, SWELLING OR PAIN  UNUSUAL VAGINAL DISCHARGE OR ITCHING   Items with * indicate a potential emergency and should be followed up as soon as possible or go to the Emergency Department if any problems should occur.  Please show the CHEMOTHERAPY ALERT CARD or IMMUNOTHERAPY ALERT CARD at check-in to the  Emergency Department and triage nurse.  Should you have questions after your visit or need to cancel or reschedule your appointment, please contact [facility] [place]  Dept: [phone]  and follow the prompts.  Office hours are 8:00 a.m. to 4:30 p.m. Monday - Friday. Please note that voicemails left after 4:00 p.m. may not be returned until the following business day.  We are closed weekends and major holidays. You have access to a nurse at all times for urgent questions. Please call the main number to the clinic Dept: [phone] and follow the prompts.  For any non-urgent questions, you may also contact your provider using MyChart. We now offer e-Visits for anyone 18 and older to request care online for non-urgent symptoms. For details visit [email or web address].   Also download the MyChart app! Go to the app store, search "MyChart", open the app, select [facility], and log in with your MyChart username and password.   

## 2023-01-04 ENCOUNTER — Telehealth: Payer: Self-pay

## 2023-01-04 NOTE — Telephone Encounter (Signed)
I spoke with pt. She states she is doing well. Pt denies N/V, diarrhea, skin rash/itching, cough, SOB, chest pain, chills and fever. I reminded her that is she were to develop temp of 100.4 or higher to call us, night or day. She verbalized understanding. Appt confirmed for tomorrow.

## 2023-01-05 ENCOUNTER — Inpatient Hospital Stay (HOSPITAL_BASED_OUTPATIENT_CLINIC_OR_DEPARTMENT_OTHER): Payer: 59 | Admitting: Oncology

## 2023-01-05 VITALS — BP 160/88 | HR 93 | Temp 98.4°F | Resp 20 | Ht 65.5 in | Wt 301.8 lb

## 2023-01-05 DIAGNOSIS — C4371 Malignant melanoma of right lower limb, including hip: Secondary | ICD-10-CM | POA: Diagnosis not present

## 2023-01-05 DIAGNOSIS — Z5112 Encounter for antineoplastic immunotherapy: Secondary | ICD-10-CM | POA: Diagnosis not present

## 2023-01-05 NOTE — Progress Notes (Signed)
[facility] Cancer Initial Visit:  Patient Care Team: Charlott Rakes, MD as PCP - General (Family Medicine)  CHIEF COMPLAINTS/PURPOSE OF CONSULTATION:  Oncology History  Malignant melanoma of right lower leg  11/15/2022 Initial Diagnosis   Malignant melanoma of right lower leg (HCC)   12/12/2022 Cancer Staging   Staging form: Melanoma of the Skin, AJCC 8th Edition - Clinical stage from 12/12/2022: Stage III (cT4b, cN1a, cM0) - Signed by [name], MD on 12/12/2022 Histopathologic type: Nodular melanoma Stage prefix: Initial diagnosis   01/03/2023 -  Chemotherapy   Patient is on Treatment Plan : MELANOMA Pembrolizumab (200) q21d       HISTORY OF PRESENTING ILLNESS: Diana Odom 47 y.o. female is here because of  malignant melanoma Medical history notable for tobacco use, diabetes mellitus type 2, hypertension, OSA on CPAP  October 25, 2022: Presented to dermatology with a red, raised, nodular lesion on right anterior shin which have been present for over a year and a half.  It began as a dark mole and had grown slowly over the period of time.  Patient underwent excisional biopsy followed by electrodesiccation and curettage Pathology demonstrated malignant melanoma at least 5 mm thick.  Deep margin was involved ulceration present mitotic index 5/mm.  Tumor infiltrating lymphocyte not brisk  Patient was subsequently referred to surgery for additional management  November 23, 2022: Wide local excision of malignant melanoma with resulting 6 cm diameter circular wound; right inguinal sentinel lymph node biopsy Pathology from the excisional biopsy site demonstrated residual melanoma in situ, no residual invasive disease, LVI invasion seen, margins free of malignancy.  No change in stage from T4b.  Sentinel lymph node positive for melanoma (3 separate aggregate blocks) no extracapsular extension Pathologic stage IIIC (T4b, N1 M0)  December 12 2022:  [place]  Medical Oncology Consult  States that her FSGB's run from 185 to 215  Feels down and depressed.  Eating a lot due to her emotions.    Social:  Divorced.  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   Quit smoking since her surgery in March 2024.  Formerly smoked 1 ppd.  EtOH drinks once every few weeks.    [facility] Mother died 84 CHF Father died late 30's CHF and COPD Sister alive 57 well Brother alive 87 CAD, DM Type II, COPD   December 16 2022:  PET/CT (Melanoma protocol) No evidence of residual melanoma or metastatic melanoma on whole-body scan.  Postsurgical seroma in the RIGHT inguinal region without  hypermetabolic lymph nodes in the RIGHT groin. Mild metabolic activity associated wide local excisions surgical site RIGHT lower extremity. Two small pulmonary nodules are favored un-related to melanoma.   MRI brain No evidence of intracranial metastatic disease.   December 20 2022:   Reviewed results of imaging and labs with patient and sister.  Discussed role of adjuvant immunotherapy.  Discussed risks and benefits of this.    January 03 2023:  Cycle 1 Keytruda  January 05 2023:  Scheduled follow up for melanoma. Has tolerated it well.  No pruritus, rash, nausea or emesis.  Provided reassuance  Review of Systems  Constitutional:  Positive for fatigue. Negative for appetite change, chills and fever.       Has been gaining weight due to increased eating  HENT:   Negative for nosebleeds and voice change.        Occasional sores on tongue and sore throat.  Occasional trouble swallowing.   Eyes:  Negative for icterus.  Vision worsened since diagnosed with DM.  Has not seen opthalmologist  Respiratory:  Negative for chest tightness, cough, hemoptysis, shortness of breath and wheezing.        PND:  none Orthopnea:  none DOE:    Cardiovascular:  Positive for palpitations. Negative for chest pain and leg swelling.       PND:  none Orthopnea:  none  Gastrointestinal:  Negative for abdominal pain, blood  in stool, constipation, nausea and vomiting.       Some diarrhea due to metformen   Endocrine: Negative for hot flashes.       Heat intolerance  Genitourinary:  Negative for bladder incontinence, difficulty urinating, dysuria, frequency, hematuria and nocturia.   Musculoskeletal:  Negative for arthralgias, back pain, gait problem, myalgias, neck pain and neck stiffness.       Has pain at the sentinel node bx site  Skin:  Negative for itching, rash and wound.  Neurological:  Negative for dizziness, extremity weakness, gait problem, light-headedness, numbness and speech difficulty.       Has some stress headaches  Hematological:  Negative for adenopathy. Bruises/bleeds easily.  Psychiatric/Behavioral:  Positive for depression. Negative for suicidal ideas. The patient is not nervous/anxious.     MEDICAL HISTORY: Past Medical History:  Diagnosis Date   Cancer    Depression    Diabetes mellitus without complication    Hypertension    Melanoma    Sleep apnea     SURGICAL HISTORY: Past Surgical History:  Procedure Laterality Date   CHOLECYSTECTOMY     MELANOMA EXCISION WITH SENTINEL LYMPH NODE BIOPSY      SOCIAL HISTORY: Social History   Socioeconomic History   Marital status: Divorced    Spouse name: Not on file   Number of children: 2   Years of education: 12   Highest education level: 12th grade  Occupational History    Comment: Print production planner  Tobacco Use   Smoking status: Former    Packs/day: 0.50    Years: 30.00    Additional pack years: 0.00    Total pack years: 15.00    Types: Cigarettes    Quit date: 11/23/2022    Years since quitting: 0.1   Smokeless tobacco: Not on file  Vaping Use   Vaping Use: Some days   Substances: CBD   Devices: CBD vape occasionally  Substance and Sexual Activity   Alcohol use: Never   Drug use: No    Types: Marijuana    Comment: former marijuana smoker   Sexual activity: Not Currently  Other Topics Concern   Not on file   Social History Narrative   Not on file   Social Determinants of Health   Financial Resource Strain: Medium Risk (12/28/2022)   Overall Financial Resource Strain (CARDIA)    Difficulty of Paying Living Expenses: Somewhat hard  Food Insecurity: No Food Insecurity (12/12/2022)   Hunger Vital Sign    Worried About Running Out of Food in the Last Year: Never true    Ran Out of Food in the Last Year: Never true  Transportation Needs: No Transportation Needs (12/12/2022)   PRAPARE - Administrator, Civil Service (Medical): No    Lack of Transportation (Non-Medical): No  Physical Activity: Inactive (12/28/2022)   Exercise Vital Sign    Days of Exercise per Week: 0 days    Minutes of Exercise per Session: 0 min  Stress: Stress Concern Present (12/28/2022)   [employer] of Occupational Health -  Occupational Stress Questionnaire    Feeling of Stress : Rather much  Social Connections: Moderately Isolated (12/28/2022)   Social Connection and Isolation Panel [NHANES]    Frequency of Communication with Friends and Family: More than three times a week    Frequency of Social Gatherings with Friends and Family: More than three times a week    Attends Religious Services: 1 to 4 times per year    Active Member of [employer] or Organizations: No    Attends Banker Meetings: Never    Marital Status: Divorced  Catering manager Violence: Not At Risk (12/12/2022)   Humiliation, Afraid, Rape, and Kick questionnaire    Fear of Current or Ex-Partner: No    Emotionally Abused: No    Physically Abused: No    Sexually Abused: No    FAMILY HISTORY No family history on file.  ALLERGIES:  has No Known Allergies.  MEDICATIONS:  Current Outpatient Medications  Medication Sig Dispense Refill   FLUoxetine (PROZAC) 20 MG tablet Take 20 mg by mouth daily.     hydrochlorothiazide (HYDRODIURIL) 25 MG tablet Take by mouth.     hydrOXYzine (ATARAX) 25 MG tablet Take 25 mg by mouth 2 (two)  times daily.     lisinopril (ZESTRIL) 20 MG tablet Take 1 tablet by mouth daily.     metFORMIN (GLUCOPHAGE) 1000 MG tablet Take 1 tablet (1,000 mg total) by mouth 2 (two) times daily with a meal. 60 tablet 1   metoprolol succinate (TOPROL-XL) 25 MG 24 hr tablet Take by mouth.     ondansetron (ZOFRAN) 8 MG tablet Take 1 tablet (8 mg total) by mouth every 8 (eight) hours as needed for nausea or vomiting. 30 tablet 1   potassium chloride (MICRO-K) 10 MEQ CR capsule Take 10 mEq by mouth 2 (two) times daily.     prochlorperazine (COMPAZINE) 10 MG tablet Take 1 tablet (10 mg total) by mouth every 6 (six) hours as needed for nausea or vomiting. 30 tablet 1   TRULICITY 0.75 MG/0.5ML SOPN      Varenicline Tartrate, Starter, 0.5 MG X 11 & 1 MG X 42 TBPK Take by mouth.     No current facility-administered medications for this visit.    PHYSICAL EXAMINATION:  ECOG PERFORMANCE STATUS: 0 - Asymptomatic   Vitals:   01/05/23 1330  BP: (!) 160/88  Pulse: 93  Resp: 20  Temp: 98.4 F (36.9 C)  SpO2: 94%    Filed Weights   01/05/23 1330  Weight: (!) 301 lb 12.8 oz (136.9 kg)     Physical Exam Vitals and nursing note reviewed.  Constitutional:      Appearance: Normal appearance. She is obese. She is not toxic-appearing or diaphoretic.     Comments: Here alone.  Comfortable  HENT:     Head: Normocephalic and atraumatic.     Right Ear: External ear normal.     Left Ear: External ear normal.     Nose: Nose normal. No congestion or rhinorrhea.  Eyes:     General: No scleral icterus.    Extraocular Movements: Extraocular movements intact.     Conjunctiva/sclera: Conjunctivae normal.     Pupils: Pupils are equal, round, and reactive to light.  Cardiovascular:     Rate and Rhythm: Normal rate.     Heart sounds: No murmur heard.    No friction rub. No gallop.  Abdominal:     General: Bowel sounds are normal.     Palpations: Abdomen is  soft.  Musculoskeletal:        General: No swelling,  tenderness or deformity.     Cervical back: Normal range of motion and neck supple. No rigidity or tenderness.  Lymphadenopathy:     Head:     Right side of head: No submental, submandibular, tonsillar, preauricular, posterior auricular or occipital adenopathy.     Left side of head: No submental, submandibular, tonsillar, preauricular, posterior auricular or occipital adenopathy.     Cervical: No cervical adenopathy.     Right cervical: No superficial, deep or posterior cervical adenopathy.    Left cervical: No superficial, deep or posterior cervical adenopathy.     Upper Body:     Right upper body: No supraclavicular, axillary, pectoral or epitrochlear adenopathy.     Left upper body: No supraclavicular, axillary, pectoral or epitrochlear adenopathy.  Skin:    General: Skin is warm and dry.     Coloration: Skin is not jaundiced.     Comments: Flushing of cheeks  Neurological:     General: No focal deficit present.     Mental Status: She is alert and oriented to person, place, and time. Mental status is at baseline.     Cranial Nerves: No cranial nerve deficit.  Psychiatric:        Mood and Affect: Mood normal.        Behavior: Behavior normal.        Thought Content: Thought content normal.        Judgment: Judgment normal.     Comments: Anxious    LABORATORY DATA: I have personally reviewed the data as listed:  Clinical Support on 12/28/2022  Component Date Value Ref Range Status   Preg, Serum 12/28/2022 NEGATIVE  NEGATIVE Final   Comment:        THE SENSITIVITY OF THIS METHODOLOGY IS >10 mIU/mL. Performed at [facility], [address] [place], [place] [address]   Appointment on 12/28/2022  Component Date Value Ref Range Status   WBC Count 12/28/2022 10.9 (H)  4.0 - 10.5 K/uL Final   RBC 12/28/2022 4.46  3.87 - 5.11 MIL/uL Final   Hemoglobin 12/28/2022 12.8  12.0 - 15.0 g/dL Final   HCT 40/98/1191 38.2  36.0 - 46.0 % Final   MCV 12/28/2022 85.7   80.0 - 100.0 fL Final   MCH 12/28/2022 28.7  26.0 - 34.0 pg Final   MCHC 12/28/2022 33.5  30.0 - 36.0 g/dL Final   RDW 47/82/9562 14.6  11.5 - 15.5 % Final   Platelet Count 12/28/2022 254  150 - 400 K/uL Final   nRBC 12/28/2022 0.0  0.0 - 0.2 % Final   Neutrophils Relative % 12/28/2022 67  % Final   Neutro Abs 12/28/2022 7.5  1.7 - 7.7 K/uL Final   Lymphocytes Relative 12/28/2022 21  % Final   Lymphs Abs 12/28/2022 2.3  0.7 - 4.0 K/uL Final   Monocytes Relative 12/28/2022 6  % Final   Monocytes Absolute 12/28/2022 0.6  0.1 - 1.0 K/uL Final   Eosinophils Relative 12/28/2022 4  % Final   Eosinophils Absolute 12/28/2022 0.4  0.0 - 0.5 K/uL Final   Basophils Relative 12/28/2022 1  % Final   Basophils Absolute 12/28/2022 0.1  0.0 - 0.1 K/uL Final   Immature Granulocytes 12/28/2022 1  % Final   Abs Immature Granulocytes 12/28/2022 0.05  0.00 - 0.07 K/uL Final   Performed at [facility], [address] [place], [place] [address]  Sodium 12/28/2022 133 (L)  135 - 145 mmol/L Final   Potassium 12/28/2022 3.6  3.5 - 5.1 mmol/L Final   Chloride 12/28/2022 99  98 - 111 mmol/L Final   CO2 12/28/2022 22  22 - 32 mmol/L Final   Glucose, Bld 12/28/2022 279 (H)  70 - 99 mg/dL Final   Glucose reference range applies only to samples taken after fasting for at least 8 hours.   BUN 12/28/2022 14  6 - 20 mg/dL Final   Creatinine 40/98/1191 0.87  0.44 - 1.00 mg/dL Final   Calcium 47/82/9562 8.6 (L)  8.9 - 10.3 mg/dL Final   Total Protein 13/04/6577 6.7  6.5 - 8.1 g/dL Final   Albumin 46/96/2952 3.7  3.5 - 5.0 g/dL Final   AST 84/13/2440 23  15 - 41 U/L Final   ALT 12/28/2022 19  0 - 44 U/L Final   Alkaline Phosphatase 12/28/2022 76  38 - 126 U/L Final   Total Bilirubin 12/28/2022 0.6  0.3 - 1.2 mg/dL Final   GFR, Estimated 12/28/2022 >60  >60 mL/min Final   Comment: (NOTE) Calculated using the CKD-EPI Creatinine Equation (2021)    Anion gap 12/28/2022 12  5 - 15 Final   Performed  at [facility], [address] [place], [place] [address]   T4, Total 12/28/2022 8.9  4.5 - 12.0 ug/dL Final   Comment: (NOTE) Performed At: [facility] [address] [place], [place] [id number] Jolene Schimke MD VQ:[id number]    TSH 12/28/2022 1.284  0.350 - 4.500 uIU/mL Final   Comment: Performed by a 3rd Generation assay with a functional sensitivity of <=0.01 uIU/mL. Performed at [facility], [address], [place], [place] [address]   Appointment on 12/12/2022  Component Date Value Ref Range Status   LDH 12/12/2022 123  98 - 192 U/L Final   Performed at [facility], [address], [place], [place] [address]  Office Visit on 12/12/2022  Component Date Value Ref Range Status   WBC 12/12/2022 11.1 (H)  4.0 - 10.5 K/uL Final   RBC 12/12/2022 4.75  3.87 - 5.11 MIL/uL Final   Hemoglobin 12/12/2022 13.5  12.0 - 15.0 g/dL Final   HCT 16/60/6301 39.4  36.0 - 46.0 % Final   MCV 12/12/2022 82.9  80.0 - 100.0 fL Final   MCH 12/12/2022 28.4  26.0 - 34.0 pg Final   MCHC 12/12/2022 34.3  30.0 - 36.0 g/dL Final   RDW 60/06/9322 14.6  11.5 - 15.5 % Final   Platelets 12/12/2022 276  150 - 400 K/uL Final   nRBC 12/12/2022 0.0  0.0 - 0.2 % Final   Neutrophils Relative % 12/12/2022 64  % Final   Neutro Abs 12/12/2022 6.9  1.7 - 7.7 K/uL Final   Lymphocytes Relative 12/12/2022 25  % Final   Lymphs Abs 12/12/2022 2.8  0.7 - 4.0 K/uL Final   Monocytes Relative 12/12/2022 8  % Final   Monocytes Absolute 12/12/2022 0.9  0.1 - 1.0 K/uL Final   Eosinophils Relative 12/12/2022 2  % Final   Eosinophils Absolute 12/12/2022 0.3  0.0 - 0.5 K/uL Final   Basophils Relative 12/12/2022 1  % Final   Basophils Absolute 12/12/2022 0.1  0.0 - 0.1 K/uL Final   Immature Granulocytes 12/12/2022 0  % Final   Abs Immature Granulocytes 12/12/2022 0.04  0.00 - 0.07 K/uL Final   Performed at [facility], [address]  [place],  [place] [address]   Sodium 12/12/2022 133 (L)  135 - 145 mmol/L Final   Potassium 12/12/2022 3.4 (L)  3.5 - 5.1 mmol/L Final   Chloride 12/12/2022 100  98 - 111 mmol/L Final   CO2 12/12/2022 24  22 - 32 mmol/L Final   Glucose, Bld 12/12/2022 157 (H)  70 - 99 mg/dL Final   Glucose reference range applies only to samples taken after fasting for at least 8 hours.   BUN 12/12/2022 10  6 - 20 mg/dL Final   Creatinine, Ser 12/12/2022 0.75  0.44 - 1.00 mg/dL Final   Calcium 60/45/4098 8.9  8.9 - 10.3 mg/dL Final   Total Protein 11/91/4782 6.9  6.5 - 8.1 g/dL Final   Albumin 95/62/1308 3.8  3.5 - 5.0 g/dL Final   AST 65/78/4696 20  15 - 41 U/L Final   ALT 12/12/2022 20  0 - 44 U/L Final   Alkaline Phosphatase 12/12/2022 79  38 - 126 U/L Final   Total Bilirubin 12/12/2022 0.6  0.3 - 1.2 mg/dL Final   GFR, Estimated 12/12/2022 >60  >60 mL/min Final   Comment: (NOTE) Calculated using the CKD-EPI Creatinine Equation (2021)    Anion gap 12/12/2022 9  5 - 15 Final   Performed at [facility], [address], [place], [place] [address]    RADIOGRAPHIC STUDIES: I have personally reviewed the radiological images as listed and agree with the findings in the report  No results found.  ASSESSMENT/PLAN 47 y.o. female is here because of  malignant melanoma.  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  Biopsied November 23 2022- Wide local excision and sentinel LN bx.  (Patient did not have clinically apparent LN's by history, nor in transit disease) December 12 2022- Obtain BRAF mutation studies on surgical material December 16 2022 -PET/CT whole body.  No residual melanoma or metastatic disease.  Two small pulmonary nodules thought unrelated. MRI brain  negative  Therapeutics   December 12 2022- Discussed use of adjuvant therapy to prevent recurrence.   December 20 2022- Per NCCN guidelines recommended adjuvant Pembrolizumab  January 03 2023- Cycle 1 Keytruda  Immunotherapy Risks:  Discussed the potential side effects of chemotherapy with the patient.  These include but are not limited to:  Fatigue, Hair loss, low blood counts (anemia, thrombocytopenia) that may necessitate transfusion, bleeding, infection, nausea/ vomiting/Appetite changes/ Constipation/ Diarrhea, mucositis, Neuropathy/ neurologic problems, skin and nail changes such as dry skin and color change, Urine and bladder changes and kidney problems, weight changes, mood changes, decreased libido/fertility problems, damage to heart and lungs.  Some of these side effects can be life threatening, may be permanent and can result in hospitalization and/or death.  In shared decision making patient has agreed to proceed with chemotherapy.  Will refer patient for formal chemotherapy teaching.    Poor venous access:  In preparation for chemotherapy we will arrange for port-a-cath placement.  We discussed benefits (more efficient access to administer chemotherapy and supportive care medications/IVF, imaging contrast, decreased risk of tissue damage from medications) and disadvantages (risks of infections, thrombosis, surgical procedure, periodic flushing).  Patient has elected to proceed with port placement.      Post operative seroma-  December 20 2022- Mild and should resolve with conservative management.  A common problem  with inguinal lymphadenectomy.  Other risk factors are obesity, DM Type II and tobacco use    Cancer Staging  Malignant melanoma of right lower leg Staging form: Melanoma of the Skin, AJCC 8th Edition - Clinical stage from 12/12/2022: Stage III (cT4b, cN1a, cM0) - Signed by [name], MD on 12/12/2022 Histopathologic type: Nodular melanoma Stage prefix: Initial  diagnosis    No problem-specific Assessment & Plan notes found for this encounter.    No orders of the defined types were placed in this encounter.   30  minutes was spent in patient care.  This included time spent preparing to see the patient (e.g., review of tests), obtaining and/or reviewing separately obtained history, counseling and educating the patient/family/caregiver, ordering tests, or procedures; documenting clinical information in the electronic or other health record, independently interpreting results and communicating results to the patient/family/caregiver as well as coordination of care.       All questions were answered. The patient knows to call the clinic with any problems, questions or concerns.  This note was electronically signed.    [name], MD  01/05/2023 1:32 PM

## 2023-01-06 ENCOUNTER — Ambulatory Visit: Payer: 59 | Admitting: Licensed Clinical Social Worker

## 2023-01-06 DIAGNOSIS — C4371 Malignant melanoma of right lower limb, including hip: Secondary | ICD-10-CM

## 2023-01-06 NOTE — Progress Notes (Signed)
[facility] CSW Counseling Note  Patient was referred by medical provider. Treatment type: Individual  Presenting Concerns: Patient and/or family reports the following symptoms/concerns: anxiety, depression, stress, and adjustment concerns due to diagnosis and treatment plan Duration of problem: 1.5 months; Severity of problem: moderate     Orientation:not oriented to person, place, time/date, situation, day of week, month of year, and year.   Affect: Depressed and Tearful Risk of harm to self or others: No plan to harm self or others  Patient and/or Family's Strengths/Protective Factors: Social and Emotional competence, Concrete supports in place (healthy food, safe environments, etc.), Physical Health (exercise, healthy diet, medication compliance, etc.), Caregiver has knowledge of parenting & child development, and Parental ResilienceAbility for insight  Active sense of humor  Average or above average intelligence  Capable of independent living  Communication skills  Motivation for treatment/growth  Special hobby/interest  Supportive family/friends  Work skills      Goals Addressed: Patient will:  Reduce symptoms of: anxiety, depression, stress, and adjustment concerns Increase knowledge and/or ability of: coping skills, healthy habits, self-management skills, stress reduction, and communication   Increase healthy adjustment to current life circumstances, Increase adequate support systems for patient/family, Increase motivation to adhere to plan of care, Improve medication compliance, and Begin healthy grieving over loss   Progress towards Goals: Initial   Interventions: Interventions utilized:  CBT, Solution Focused, Strength-based, Assertiveness Training, Supportive, and Reframing      Assessment: Patient currently experiencing anxiety and depression due to recent diagnosis and treatment plan.  Patient reports feeling upset and an increase in symptoms of depression.  Patient  reports she is having a difficult time asking for help and communicating with family, friends and co-workers how she is doing. Patient reports she has brought to the attention of her providers swelling which is occurring at the site of the lymph node removal and stated "I have been dismissed and my concerns brushed off by all of my providers.  I just want someone to tell me why this is happening."  CSW encouraged patient to ask specific question about the swelling and to request a referral for lymphatic drainage therapy if needed.  CSW spoke with patient about communicating concerns and needs with support system, and setting small attainable goals for each day. Discussed skills to reduce symptoms of anxiety and depression.    Plan: Follow up with CSW: 1 week 01/13/2023 @ 2:00PM virtual Behavioral recommendations: use skills for anxiety reduction, communicate needs clearly Referral(s): Support group(s):  N/A         Joseph Art, LCSW

## 2023-01-06 NOTE — Progress Notes (Signed)
Enrolled patient into the [employer]. °

## 2023-01-07 ENCOUNTER — Other Ambulatory Visit: Payer: Self-pay

## 2023-01-09 ENCOUNTER — Institutional Professional Consult (permissible substitution): Payer: 59 | Admitting: Nurse Practitioner

## 2023-01-11 ENCOUNTER — Encounter: Payer: Self-pay | Admitting: Oncology

## 2023-01-11 DIAGNOSIS — G4733 Obstructive sleep apnea (adult) (pediatric): Secondary | ICD-10-CM | POA: Diagnosis not present

## 2023-01-11 DIAGNOSIS — Z452 Encounter for adjustment and management of vascular access device: Secondary | ICD-10-CM | POA: Diagnosis not present

## 2023-01-11 DIAGNOSIS — E119 Type 2 diabetes mellitus without complications: Secondary | ICD-10-CM | POA: Diagnosis not present

## 2023-01-11 DIAGNOSIS — J45909 Unspecified asthma, uncomplicated: Secondary | ICD-10-CM | POA: Diagnosis not present

## 2023-01-11 DIAGNOSIS — C4371 Malignant melanoma of right lower limb, including hip: Secondary | ICD-10-CM | POA: Diagnosis not present

## 2023-01-11 DIAGNOSIS — Z7984 Long term (current) use of oral hypoglycemic drugs: Secondary | ICD-10-CM | POA: Diagnosis not present

## 2023-01-11 DIAGNOSIS — Z79899 Other long term (current) drug therapy: Secondary | ICD-10-CM | POA: Diagnosis not present

## 2023-01-11 DIAGNOSIS — L7634 Postprocedural seroma of skin and subcutaneous tissue following other procedure: Secondary | ICD-10-CM | POA: Diagnosis not present

## 2023-01-11 DIAGNOSIS — F17210 Nicotine dependence, cigarettes, uncomplicated: Secondary | ICD-10-CM | POA: Diagnosis not present

## 2023-01-11 DIAGNOSIS — Z5112 Encounter for antineoplastic immunotherapy: Secondary | ICD-10-CM | POA: Insufficient documentation

## 2023-01-11 DIAGNOSIS — Z7985 Long-term (current) use of injectable non-insulin antidiabetic drugs: Secondary | ICD-10-CM | POA: Diagnosis not present

## 2023-01-12 ENCOUNTER — Telehealth: Payer: Self-pay

## 2023-01-12 ENCOUNTER — Other Ambulatory Visit: Payer: Self-pay

## 2023-01-12 NOTE — Telephone Encounter (Signed)
Pt called to report new rash has developed on her left wrist since Keytruda infusion. The rash is red, raised and a little itchy. She hasn't tried anything OTC to the wrist. Skin is not broken. Afebrile.

## 2023-01-13 ENCOUNTER — Inpatient Hospital Stay: Payer: 59 | Admitting: Licensed Clinical Social Worker

## 2023-01-13 DIAGNOSIS — C4371 Malignant melanoma of right lower limb, including hip: Secondary | ICD-10-CM

## 2023-01-13 NOTE — Progress Notes (Signed)
[facility] CSW Progress Note  Clinical Social Worker contacted patient by phone to reschedule counseling appointment  Patient stated she forgot and rescheduled appointment for May 1st @ 2:00PM in person.    Joseph Art, LCSW

## 2023-01-18 ENCOUNTER — Inpatient Hospital Stay: Payer: 59 | Attending: Oncology | Admitting: Licensed Clinical Social Worker

## 2023-01-18 DIAGNOSIS — Z79899 Other long term (current) drug therapy: Secondary | ICD-10-CM | POA: Insufficient documentation

## 2023-01-18 DIAGNOSIS — C4371 Malignant melanoma of right lower limb, including hip: Secondary | ICD-10-CM | POA: Insufficient documentation

## 2023-01-18 DIAGNOSIS — Z5112 Encounter for antineoplastic immunotherapy: Secondary | ICD-10-CM | POA: Insufficient documentation

## 2023-01-18 NOTE — Progress Notes (Signed)
[facility] CSW Counseling Note  Patient was referred by medical provider. Treatment type: Individual  Presenting Concerns: Patient and/or family reports the following symptoms/concerns: anxiety, depression, stress, and adjustment to diagnosis and treatment Duration of problem: 1.5  months; Severity of problem: moderate   Orientation:oriented to person, place, time/date, situation, day of week, month of year, and year.   Affect: Depressed and Tearful Risk of harm to self or others: No plan to harm self or others  Patient and/or Family's Strengths/Protective Factors: Social connections, Social and Emotional competence, Concrete supports in place (healthy food, safe environments, etc.), Sense of purpose, Physical Health (exercise, healthy diet, medication compliance, etc.), Caregiver has knowledge of parenting & child development, and Parental ResilienceAbility for insight  Active sense of humor  Average or above average intelligence  Capable of independent living  Communication skills  General fund of knowledge  Motivation for treatment/growth  Physical Health  Special hobby/interest  Supportive family/friends  Work skills      Goals Addressed: Patient will:  Reduce symptoms of: anxiety, depression, stress, and adjustment concerns Increase knowledge and/or ability of: coping skills, healthy habits, self-management skills, and stress reduction  Increase healthy adjustment to current life circumstances, Increase adequate support systems for patient/family, Begin healthy grieving over loss, and increase effective communication   Progress towards Goals: Progressing   Interventions: Interventions utilized:  CBT, Solution Focused, Strength-based, Family Systems, and Reframing      Assessment: Patient currently experiencing anxiety and depression due to recent diagnosis and treatment plan.  Patient reports feeling upset and an increase in symptoms of depression.  Patient reports she  continues to have a difficult time asking for help and communicating with family, friends and co-workers on how she is doing. CSW spoke with patient about communicating concerns and needs with support system, and setting small attainable goals for each day. Discussed skills to reduce symptoms of anxiety and depression. .      Plan: Follow up with CSW: 1 week  Behavioral recommendations: use skills for anxiety reduction, communicate needs clearly  Referral(s): Support group(s):  N/A         Joseph Art, LCSW

## 2023-01-23 ENCOUNTER — Inpatient Hospital Stay: Payer: 59

## 2023-01-23 ENCOUNTER — Encounter: Payer: Self-pay | Admitting: Oncology

## 2023-01-23 ENCOUNTER — Inpatient Hospital Stay (HOSPITAL_BASED_OUTPATIENT_CLINIC_OR_DEPARTMENT_OTHER): Payer: 59 | Admitting: Oncology

## 2023-01-23 VITALS — BP 146/95 | HR 77 | Temp 98.5°F | Resp 18 | Ht 65.5 in | Wt 296.6 lb

## 2023-01-23 DIAGNOSIS — Z08 Encounter for follow-up examination after completed treatment for malignant neoplasm: Secondary | ICD-10-CM | POA: Diagnosis not present

## 2023-01-23 DIAGNOSIS — T792XXA Traumatic secondary and recurrent hemorrhage and seroma, initial encounter: Secondary | ICD-10-CM

## 2023-01-23 DIAGNOSIS — L270 Generalized skin eruption due to drugs and medicaments taken internally: Secondary | ICD-10-CM | POA: Diagnosis not present

## 2023-01-23 DIAGNOSIS — I878 Other specified disorders of veins: Secondary | ICD-10-CM

## 2023-01-23 DIAGNOSIS — Z5112 Encounter for antineoplastic immunotherapy: Secondary | ICD-10-CM | POA: Diagnosis not present

## 2023-01-23 DIAGNOSIS — C4371 Malignant melanoma of right lower limb, including hip: Secondary | ICD-10-CM | POA: Diagnosis not present

## 2023-01-23 DIAGNOSIS — Z79899 Other long term (current) drug therapy: Secondary | ICD-10-CM | POA: Diagnosis not present

## 2023-01-23 LAB — CMP (CANCER CENTER ONLY)
ALT: 23 U/L (ref 0–44)
AST: 21 U/L (ref 15–41)
Albumin: 3.6 g/dL (ref 3.5–5.0)
Alkaline Phosphatase: 89 U/L (ref 38–126)
Anion gap: 10 (ref 5–15)
BUN: 15 mg/dL (ref 6–20)
CO2: 21 mmol/L — ABNORMAL LOW (ref 22–32)
Calcium: 8.9 mg/dL (ref 8.9–10.3)
Chloride: 100 mmol/L (ref 98–111)
Creatinine: 0.81 mg/dL (ref 0.44–1.00)
GFR, Estimated: >60 mL/min (ref >60)
Glucose, Bld: 260 mg/dL — ABNORMAL HIGH (ref 70–99)
Potassium: 3.9 mmol/L (ref 3.5–5.1)
Sodium: 131 mmol/L — ABNORMAL LOW (ref 135–145)
Total Bilirubin: 0.4 mg/dL (ref 0.3–1.2)
Total Protein: 6.8 g/dL (ref 6.5–8.1)

## 2023-01-23 LAB — CBC WITH DIFFERENTIAL (CANCER CENTER ONLY)
Abs Immature Granulocytes: 0.04 10*3/uL (ref 0.00–0.07)
Basophils Absolute: 0.1 10*3/uL (ref 0.0–0.1)
Basophils Relative: 1 %
Eosinophils Absolute: 0.4 10*3/uL (ref 0.0–0.5)
Eosinophils Relative: 4 %
HCT: 39.5 % (ref 36.0–46.0)
Hemoglobin: 13.3 g/dL (ref 12.0–15.0)
Immature Granulocytes: 0 %
Lymphocytes Relative: 20 %
Lymphs Abs: 2 10*3/uL (ref 0.7–4.0)
MCH: 28.4 pg (ref 26.0–34.0)
MCHC: 33.7 g/dL (ref 30.0–36.0)
MCV: 84.4 fL (ref 80.0–100.0)
Monocytes Absolute: 0.8 10*3/uL (ref 0.1–1.0)
Monocytes Relative: 8 %
Neutro Abs: 6.7 10*3/uL (ref 1.7–7.7)
Neutrophils Relative %: 67 %
Platelet Count: 248 10*3/uL (ref 150–400)
RBC: 4.68 MIL/uL (ref 3.87–5.11)
RDW: 13.4 % (ref 11.5–15.5)
WBC Count: 10 10*3/uL (ref 4.0–10.5)
nRBC: 0 % (ref 0.0–0.2)

## 2023-01-23 LAB — HCG, SERUM, QUALITATIVE: Preg, Serum: NEGATIVE

## 2023-01-23 NOTE — Progress Notes (Signed)
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 female is here because of  malignant melanoma Medical history notable for tobacco use, diabetes mellitus type 2, hypertension, OSA on CPAP  October 25, 2022: Presented to dermatology with a red, raised, nodular lesion on right anterior shin which have been present for over a year and a half.  It began as a dark mole and had grown slowly over the period of time.  Patient underwent excisional biopsy followed by electrodesiccation and curettage Pathology demonstrated malignant melanoma at least 5 mm thick.  Deep margin was involved ulceration present mitotic index 5/mm.  Tumor infiltrating lymphocyte not brisk  Patient was subsequently referred to surgery for additional management  November 23, 2022: Wide local excision of malignant melanoma with resulting 6 cm diameter circular wound; right inguinal sentinel lymph node biopsy Pathology from the excisional biopsy site demonstrated residual melanoma in situ, no residual invasive disease, LVI invasion seen, margins free of malignancy.  No change in stage from T4b.  Sentinel lymph node positive for melanoma (3 separate aggregate blocks) no extracapsular extension Pathologic stage IIIC (T4b, N1 M0)  December 12 2022:  [place]  Medical Oncology Consult  States that her FSGB's run from 185 to 215  Feels down and depressed.  Eating a lot due to her emotions.    Social:  Divorced.  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   Quit smoking since her surgery in March 2024.  Formerly smoked 1 ppd.  EtOH drinks once every few weeks.    [facility] Mother died 31 CHF Father died late 27's CHF and COPD Sister alive 23 well Brother alive 32 CAD, DM Type II, COPD   December 16 2022:  PET/CT (Melanoma protocol) No evidence of residual melanoma or metastatic melanoma on whole-body scan.  Postsurgical seroma in the RIGHT inguinal region without  hypermetabolic lymph nodes in the RIGHT groin. Mild metabolic activity associated wide local excisions surgical site RIGHT lower extremity. Two small pulmonary nodules are favored un-related to melanoma.   MRI brain No evidence of intracranial metastatic disease.   December 20 2022:   Reviewed results of imaging and labs with patient and sister.  Discussed role of adjuvant immunotherapy.  Discussed risks and benefits of this.    January 03 2023:  Cycle 1 Keytruda  January 05 2023:  Has tolerated it well.  No pruritus, rash, nausea or emesis.  Provided reassuance  January 11 2023:  Port placed.    Jan 23, 2023: Scheduled follow-up for melanoma.  Has lost 5 lbs.  Recovering from a bout of diarrhea from the weekend of March 26th; helped by imodium.  Appetite better but overall diminished.  Slight erythematous rash on wrists, resolving.  Slight nausea, no emesis.  Sentinel LN bx site healing and no longer draining.    Jan 25, 2023;  Cycle 2 Keytruda  Review of Systems  Constitutional:  Positive for fatigue. Negative for appetite change, chills and fever.       Has been gaining weight due to increased eating  HENT:   Negative for nosebleeds and voice change.        Occasional sores on tongue and sore throat.  Occasional trouble swallowing.   Eyes:  Negative for icterus.       Vision worsened since diagnosed  with DM.  Has not seen opthalmologist  Respiratory:  Negative for chest tightness, cough, hemoptysis, shortness of breath and wheezing.        PND:  none Orthopnea:  none DOE:    Cardiovascular:  Positive for palpitations. Negative for chest pain and leg swelling.       PND:  none Orthopnea:  none  Gastrointestinal:  Negative for abdominal pain, blood in stool, constipation, nausea and vomiting.       Some diarrhea due to metformen   Endocrine: Negative for hot flashes.       Heat intolerance  Genitourinary:  Negative for bladder incontinence, difficulty urinating, dysuria, frequency, hematuria and nocturia.   Musculoskeletal:  Negative for arthralgias, back pain, gait problem, myalgias, neck pain and neck stiffness.  Skin:  Negative for itching, rash and wound.  Neurological:  Negative for dizziness, extremity weakness, gait problem, light-headedness, numbness and speech difficulty.       Has some stress headaches  Hematological:  Negative for adenopathy. Bruises/bleeds easily.  Psychiatric/Behavioral:  Positive for depression. Negative for suicidal ideas. The patient is not nervous/anxious.     MEDICAL HISTORY: Past Medical History:  Diagnosis Date   Cancer (HCC)    Depression    Diabetes mellitus without complication (HCC)    Hypertension    Melanoma (HCC)    Sleep apnea     SURGICAL HISTORY: Past Surgical History:  Procedure Laterality Date   CHOLECYSTECTOMY     MELANOMA EXCISION WITH SENTINEL LYMPH NODE BIOPSY      SOCIAL HISTORY: Social History   Socioeconomic History   Marital status: Divorced    Spouse name: Not on file   Number of children: 2   Years of education: 12   Highest education level: 12th grade  Occupational History    Comment: Print production planner  Tobacco Use   Smoking status: Former    Packs/day: 0.50    Years: 30.00    Additional pack years: 0.00    Total pack years: 15.00    Types: Cigarettes    Quit date: 11/23/2022    Years since quitting:  0.1   Smokeless tobacco: Not on file  Vaping Use   Vaping Use: Some days   Substances: CBD   Devices: CBD vape occasionally  Substance and Sexual Activity   Alcohol use: Never   Drug use: No    Types: Marijuana    Comment: former marijuana smoker   Sexual activity: Not Currently  Other Topics Concern   Not on file  Social History Narrative   Not on file   Social Determinants of Health   Financial Resource Strain: Medium Risk (12/28/2022)   Overall Financial Resource Strain (CARDIA)    Difficulty of Paying Living Expenses: Somewhat hard  Food Insecurity: No Food Insecurity (12/12/2022)   Hunger Vital Sign    Worried About Running Out of Food in the Last Year: Never true    Ran Out of Food in the Last Year: Never true  Transportation Needs: No Transportation  Needs (12/12/2022)   PRAPARE - Administrator, Civil Service (Medical): No    Lack of Transportation (Non-Medical): No  Physical Activity: Inactive (12/28/2022)   Exercise Vital Sign    Days of Exercise per Week: 0 days    Minutes of Exercise per Session: 0 min  Stress: Stress Concern Present (12/28/2022)   [employer] of Occupational Health - Occupational Stress Questionnaire    Feeling of Stress : Rather much  Social Connections: Moderately Isolated (12/28/2022)   Social Connection and Isolation Panel [NHANES]    Frequency of Communication with Friends and Family: More than three times a week    Frequency of Social Gatherings with Friends and Family: More than three times a week    Attends Religious Services: 1 to 4 times per year    Active Member of [employer] or Organizations: No    Attends Banker Meetings: Never    Marital Status: Divorced  Catering manager Violence: Not At Risk (12/12/2022)   Humiliation, Afraid, Rape, and Kick questionnaire    Fear of Current or Ex-Partner: No    Emotionally Abused: No    Physically Abused: No    Sexually Abused: No    FAMILY HISTORY No family history  on file.  ALLERGIES:  has No Known Allergies.  MEDICATIONS:  Current Outpatient Medications  Medication Sig Dispense Refill   FLUoxetine (PROZAC) 20 MG tablet Take 20 mg by mouth daily.     hydrochlorothiazide (HYDRODIURIL) 25 MG tablet Take by mouth.     hydrOXYzine (ATARAX) 25 MG tablet Take 25 mg by mouth 2 (two) times daily.     lisinopril (ZESTRIL) 20 MG tablet Take 1 tablet by mouth daily.     metFORMIN (GLUCOPHAGE) 1000 MG tablet Take 1 tablet (1,000 mg total) by mouth 2 (two) times daily with a meal. 60 tablet 1   metoprolol succinate (TOPROL-XL) 25 MG 24 hr tablet Take by mouth.     ondansetron (ZOFRAN) 8 MG tablet Take 1 tablet (8 mg total) by mouth every 8 (eight) hours as needed for nausea or vomiting. 30 tablet 1   potassium chloride (MICRO-K) 10 MEQ CR capsule Take 10 mEq by mouth 2 (two) times daily.     prochlorperazine (COMPAZINE) 10 MG tablet Take 1 tablet (10 mg total) by mouth every 6 (six) hours as needed for nausea or vomiting. 30 tablet 1   TRULICITY 0.75 MG/0.5ML SOPN      Varenicline Tartrate, Starter, 0.5 MG X 11 & 1 MG X 42 TBPK Take by mouth.     No current facility-administered medications for this visit.    PHYSICAL EXAMINATION:  ECOG PERFORMANCE STATUS: 0 - Asymptomatic   Vitals:   01/23/23 0916  BP: (!) 146/95  Pulse: 77  Resp: 18  Temp: 98.5 F (36.9 C)  SpO2: 97%    Filed Weights   01/23/23 0916  Weight: 296 lb 9.6 oz (134.5 kg)     Physical Exam Vitals and nursing note reviewed.  Constitutional:      Appearance: Normal appearance. She is obese. She is not toxic-appearing or diaphoretic.     Comments: Here alone.  Comfortable  HENT:     Head: Normocephalic and atraumatic.     Right Ear: External ear normal.     Left Ear: External ear normal.     Nose: Nose normal. No congestion or rhinorrhea.  Eyes:     General: No scleral icterus.    Extraocular Movements: Extraocular movements intact.  Conjunctiva/sclera: Conjunctivae  normal.     Pupils: Pupils are equal, round, and reactive to light.  Cardiovascular:     Rate and Rhythm: Normal rate.     Heart sounds: No murmur heard.    No friction rub. No gallop.  Abdominal:     General: Bowel sounds are normal.     Palpations: Abdomen is soft.  Musculoskeletal:        General: No swelling, tenderness or deformity.     Cervical back: Normal range of motion and neck supple. No rigidity or tenderness.  Lymphadenopathy:     Head:     Right side of head: No submental, submandibular, tonsillar, preauricular, posterior auricular or occipital adenopathy.     Left side of head: No submental, submandibular, tonsillar, preauricular, posterior auricular or occipital adenopathy.     Cervical: No cervical adenopathy.     Right cervical: No superficial, deep or posterior cervical adenopathy.    Left cervical: No superficial, deep or posterior cervical adenopathy.     Upper Body:     Right upper body: No supraclavicular, axillary, pectoral or epitrochlear adenopathy.     Left upper body: No supraclavicular, axillary, pectoral or epitrochlear adenopathy.  Skin:    General: Skin is warm and dry.     Coloration: Skin is not jaundiced.     Comments: Flushing of cheeks  Neurological:     General: No focal deficit present.     Mental Status: She is alert and oriented to person, place, and time. Mental status is at baseline.     Cranial Nerves: No cranial nerve deficit.  Psychiatric:        Mood and Affect: Mood normal.        Behavior: Behavior normal.        Thought Content: Thought content normal.        Judgment: Judgment normal.     Comments: Anxious     LABORATORY DATA: I have personally reviewed the data as listed:  Clinical Support on 12/28/2022  Component Date Value Ref Range Status   Preg, Serum 12/28/2022 NEGATIVE  NEGATIVE Final   Comment:        THE SENSITIVITY OF THIS METHODOLOGY IS >10 mIU/mL. Performed at [facility], [address] [place], [place] [address]   Appointment on 12/28/2022  Component Date Value Ref Range Status   WBC Count 12/28/2022 10.9 (H)  4.0 - 10.5 K/uL Final   RBC 12/28/2022 4.46  3.87 - 5.11 MIL/uL Final   Hemoglobin 12/28/2022 12.8  12.0 - 15.0 g/dL Final   HCT 60/45/4098 38.2  36.0 - 46.0 % Final   MCV 12/28/2022 85.7  80.0 - 100.0 fL Final   MCH 12/28/2022 28.7  26.0 - 34.0 pg Final   MCHC 12/28/2022 33.5  30.0 - 36.0 g/dL Final   RDW 11/91/4782 14.6  11.5 - 15.5 % Final   Platelet Count 12/28/2022 254  150 - 400 K/uL Final   nRBC 12/28/2022 0.0  0.0 - 0.2 % Final   Neutrophils Relative % 12/28/2022 67  % Final   Neutro Abs 12/28/2022 7.5  1.7 - 7.7 K/uL Final   Lymphocytes Relative 12/28/2022 21  % Final   Lymphs Abs 12/28/2022 2.3  0.7 - 4.0 K/uL Final   Monocytes Relative 12/28/2022 6  % Final   Monocytes Absolute 12/28/2022 0.6  0.1 - 1.0 K/uL Final   Eosinophils Relative 12/28/2022 4  % Final   Eosinophils Absolute 12/28/2022 0.4  0.0 - 0.5 K/uL Final   Basophils Relative 12/28/2022 1  % Final   Basophils Absolute 12/28/2022 0.1  0.0 - 0.1 K/uL Final   Immature Granulocytes 12/28/2022 1  % Final   Abs Immature Granulocytes 12/28/2022 0.05  0.00 - 0.07 K/uL Final   Performed at [facility], [address] [place], [place] [address]   Sodium 12/28/2022 133 (L)  135 - 145 mmol/L Final   Potassium 12/28/2022 3.6  3.5 - 5.1 mmol/L Final   Chloride 12/28/2022 99  98 - 111 mmol/L Final   CO2 12/28/2022 22  22 - 32 mmol/L Final   Glucose, Bld 12/28/2022 279 (H)  70 - 99 mg/dL Final   Glucose reference range applies only to samples taken after fasting for at least 8 hours.   BUN 12/28/2022 14  6 - 20 mg/dL Final   Creatinine 91/47/8295 0.87  0.44 - 1.00 mg/dL Final   Calcium 62/13/0865 8.6 (L)  8.9 - 10.3 mg/dL Final   Total Protein 78/46/9629 6.7  6.5 - 8.1 g/dL Final   Albumin 52/84/1324 3.7  3.5 - 5.0 g/dL Final   AST 40/06/2724 23  15 - 41 U/L Final    ALT 12/28/2022 19  0 - 44 U/L Final   Alkaline Phosphatase 12/28/2022 76  38 - 126 U/L Final   Total Bilirubin 12/28/2022 0.6  0.3 - 1.2 mg/dL Final   GFR, Estimated 12/28/2022 >60  >60 mL/min Final   Comment: (NOTE) Calculated using the CKD-EPI Creatinine Equation (2021)    Anion gap 12/28/2022 12  5 - 15 Final   Performed at [facility], [address], [place], [place] [address]   T4, Total 12/28/2022 8.9  4.5 - 12.0 ug/dL Final   Comment: (NOTE) Performed At: [facility] [address] [place], [place] [id number] Jolene Schimke MD [id number]    TSH 12/28/2022 1.284  0.350 - 4.500 uIU/mL Final   Comment: Performed by a 3rd Generation assay with a functional sensitivity of <=0.01 uIU/mL. Performed at [facility], [address] [place], [place] [address]     RADIOGRAPHIC STUDIES: I have personally reviewed the radiological images as listed and agree with the findings in the report  No results found.  ASSESSMENT/PLAN 47 y.o. female is here because of  malignant melanoma.  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  Biopsied November 23 2022- Wide local excision and sentinel LN bx.  (Patient did not have clinically apparent LN's by history, nor in transit disease) December 12 2022- Obtain BRAF mutation studies on surgical material December 16 2022 -PET/CT whole body.  No residual melanoma or metastatic disease.  Two small pulmonary nodules thought unrelated. MRI brain negative  Therapeutics   December 12 2022- Discussed use of adjuvant therapy to prevent recurrence.   December 20 2022- Per NCCN guidelines recommended adjuvant Pembrolizumab  January 03 2023- Cycle 1 Keytruda  Jan 23 2023- Slight skin rash which is being managed with  low potency topical steroids  Jan 25 2023- Cycle 2 Keytruda   Poor venous access:     December 22 2022- Portacath placed  Post operative seroma-  December 20 2022- Mild and should resolve with conservative management.  A common problem with inguinal lymphadenectomy.  Other risk factors are  obesity, DM Type II and tobacco use  Jan 23 2023- Resolved with conservative management    Cancer Staging  Malignant melanoma of right lower leg [facility]) Staging form: Melanoma of the Skin, AJCC 8th Edition - Clinical stage from 12/12/2022: Stage III (cT4b, cN1a, cM0) - Signed by [name], MD on 12/12/2022 Histopathologic type: Nodular melanoma Stage prefix: Initial diagnosis    No problem-specific Assessment & Plan notes found for this encounter.    No orders of the defined types were placed in this encounter.   30  minutes was spent in patient care.  This included time spent preparing to see the patient (e.g., review of tests), obtaining and/or reviewing separately obtained history, counseling and educating the patient/family/caregiver, ordering tests, or procedures; documenting clinical information in the electronic or other health record, independently interpreting results and communicating results to the patient/family/caregiver as well as coordination of care.       All questions were answered. The patient knows to call the clinic with any problems, questions or concerns.  This note was electronically signed.    [name], MD  01/23/2023 9:26 AM

## 2023-01-24 ENCOUNTER — Encounter: Payer: Self-pay | Admitting: Licensed Clinical Social Worker

## 2023-01-24 ENCOUNTER — Other Ambulatory Visit: Payer: Self-pay

## 2023-01-24 MED FILL — Pembrolizumab IV Soln 100 MG/4ML (25 MG/ML): INTRAVENOUS | Qty: 8 | Status: AC

## 2023-01-24 NOTE — Progress Notes (Signed)
[facility] CSW Progress Note  Clinical Child psychotherapist contacted patient by phone to schedule counseling appointment.    Joseph Art, LCSW Clinical Social Worker [facility]    Patient is participating in a Managed Medicaid Plan:  {MM YES/NO:27447::"Yes"}

## 2023-01-25 ENCOUNTER — Institutional Professional Consult (permissible substitution): Payer: 59 | Admitting: Nurse Practitioner

## 2023-01-25 ENCOUNTER — Inpatient Hospital Stay: Payer: 59

## 2023-01-25 ENCOUNTER — Ambulatory Visit: Payer: 59 | Admitting: Dietician

## 2023-01-25 VITALS — BP 139/75 | HR 87 | Temp 97.0°F | Resp 20 | Ht 65.5 in | Wt 294.0 lb

## 2023-01-25 DIAGNOSIS — Z5112 Encounter for antineoplastic immunotherapy: Secondary | ICD-10-CM | POA: Diagnosis not present

## 2023-01-25 DIAGNOSIS — C4371 Malignant melanoma of right lower limb, including hip: Secondary | ICD-10-CM

## 2023-01-25 DIAGNOSIS — Z79899 Other long term (current) drug therapy: Secondary | ICD-10-CM | POA: Diagnosis not present

## 2023-01-25 MED ORDER — SODIUM CHLORIDE 0.9 % IV SOLN: Freq: Once | INTRAVENOUS | Status: AC

## 2023-01-25 MED ORDER — SODIUM CHLORIDE 0.9 % IV SOLN
200.0000 mg | Freq: Once | INTRAVENOUS | Status: AC
  Administered 2023-01-25: 200 mg via INTRAVENOUS
  Filled 2023-01-25: qty 8

## 2023-01-25 MED ORDER — SODIUM CHLORIDE 0.9% FLUSH
10.0000 mL | INTRAVENOUS | Status: DC | PRN
  Administered 2023-01-25: 10 mL

## 2023-01-25 MED ORDER — HEPARIN SOD (PORK) LOCK FLUSH 100 UNIT/ML IV SOLN
500.0000 [IU] | Freq: Once | INTRAVENOUS | Status: AC | PRN
  Administered 2023-01-25: 500 [IU]

## 2023-01-25 NOTE — Progress Notes (Signed)
Nutrition Assessment   Reason for Assessment: MST screen for weight loss.    ASSESSMENT: Patient is a 47 year old female with recently diagnosed Malignant melanoma of right lower leg.  She has coexisting conditions that include DM2, HTN, and depression.  Called patient during her infusion on home/cell# she reports she gained a lot of weight with stress eating (loves sweets and was eating them up when stressed, has cut back).  She has struggled a little with appetite and intake lately. Feels hunger then loses interest  intake 50% of usual. Diarrhea is also a concern few days after 1st treatment she could barely eat, now normalizing with  3-4 movements daily.  Getting some relief with Imodium. No food allergies.  Been craving eggs and fruit.  Checks blood sugars at home but " not as often lately." Usual intake lately: Breakfast: chicken biscuit, bagel cream cheese or yogurt with granola Lunch: wants eggs lately, Sausage and eggs Dinner steak, potato or cheese and saltines HS. peanut butter  and jelly sandwiches Fluids: can barely drink water now used to drink lots, craving soda now only drinks diet,  likes Ginger tea  Medications: Metformin, Trulicity, KCl Mg  Labs: 01/23/23 Na 131, Glucose 260  Anthropometrics: lost 12# (3.9%) past month  Height: 65.5" Weight:  01/25/23  294# 01/23/23  296.6# 01/03/23  306.3 BMI: 48.18  NUTRITION DIAGNOSIS: Inadequate PO intake to meet increased nutrient needs, r/t cancer diagnosis and anxiety  INTERVENTION:  Relayed that nutrition services are wrap around service provided at no charge and encouraged continued communication if experiencing continued weight loss or any nutritional impact symptoms (NIS). Encouraged use of Ginger products and liquids to aid in nausea and improve fluid intake Educated on importance of adequate calorie and protein energy intake  with nutrient dense foods when possible to maintain weight/strength Encouraged small frequent feeds  and trying to eat 6 small meals Reviewed strategies for diarrhea and foods to boost soluble fibers along with strategies for fluid replacement (sports drinks and broths) Encouraged use of [employer] CSW and programs for coping skills, and use of deep breaths to center herself. Emailed Nutrition Tip sheet  for  nausea and diarrhea with contact information provided   MONITORING, EVALUATION, GOAL: weight, PO intake, Nutrition Impact Symptoms, labs Goal is weight maintenance  Next Visit: Remote next month  Gennaro Africa, RDN, LDN Registered Dietitian, [facility] Part Time Remote (Usual office hours: Tuesday-Thursday) Cell: [phone]

## 2023-01-25 NOTE — Patient Instructions (Signed)

## 2023-01-26 ENCOUNTER — Other Ambulatory Visit: Payer: Self-pay

## 2023-02-01 ENCOUNTER — Inpatient Hospital Stay: Payer: 59 | Admitting: Licensed Clinical Social Worker

## 2023-02-01 ENCOUNTER — Encounter: Payer: Self-pay | Admitting: Oncology

## 2023-02-01 DIAGNOSIS — Z6841 Body Mass Index (BMI) 40.0 and over, adult: Secondary | ICD-10-CM | POA: Diagnosis not present

## 2023-02-01 DIAGNOSIS — F17200 Nicotine dependence, unspecified, uncomplicated: Secondary | ICD-10-CM | POA: Diagnosis not present

## 2023-02-01 DIAGNOSIS — Z1231 Encounter for screening mammogram for malignant neoplasm of breast: Secondary | ICD-10-CM | POA: Diagnosis not present

## 2023-02-01 DIAGNOSIS — F4322 Adjustment disorder with anxiety: Secondary | ICD-10-CM | POA: Diagnosis not present

## 2023-02-01 DIAGNOSIS — E1165 Type 2 diabetes mellitus with hyperglycemia: Secondary | ICD-10-CM | POA: Diagnosis not present

## 2023-02-01 NOTE — Progress Notes (Unsigned)
[facility] CSW Counseling Note  Patient was referred by medical provider. Treatment type: Individual  Presenting Concerns: Patient and/or family reports the following symptoms/concerns: anxiety, depression, stress, and adjustment to diagnosis and treatment Duration of problem: 1.6 months; Severity of problem: moderate   Orientation:oriented to person, place, time/date, situation, day of week, month of year, and year.   Affect: Tearful Risk of harm to self or others: No plan to harm self or others  Patient and/or Family's Strengths/Protective Factors: Social and Emotional competence, Concrete supports in place (healthy food, safe environments, etc.), Sense of purpose, Physical Health (exercise, healthy diet, medication compliance, etc.), and Caregiver has knowledge of parenting & child developmentActive sense of humor  Average or above average intelligence  Capable of independent living  Communication skills  Financial means  General fund of knowledge  Motivation for treatment/growth  Physical Health  Special hobby/interest  Supportive family/friends  Work skills      Goals Addressed: Patient will:  Reduce symptoms of: anxiety, depression, stress, and adjustment concerns Increase knowledge and/or ability of: coping skills, healthy habits, self-management skills, stress reduction, and communication and boundary setting   Increase healthy adjustment to current life circumstances, Increase adequate support systems for patient/family, Decrease self-medicating behaviors, and Begin healthy grieving over loss   Progress towards Goals: Progressing   Interventions: Interventions utilized:  CBT, Solution Focused, Strength-based, Supportive, Family Systems, and Reframing      Assessment: Patient currently experiencing anxiety and depression due to recent diagnosis and treatment plan.  Patient reports feeling upset and an increase in symptoms of depression.  Patient reports she continues to have  a difficult time asking for help and communicating with family, friends and co-workers on how she is doing. Discussed with patient about communicating concerns and needs with support system, and setting small attainable goals for each day. Discussed skills to reduce symptoms of anxiety and depression. .      Plan: Follow up with CSW: 02/09/2023 @ 2:00 telephone Behavioral recommendations:  use skills for anxiety reduction, communicate needs clearly, implement boundaries Referral(s): Support group(s):  N/A         Joseph Art, LCSW

## 2023-02-02 ENCOUNTER — Encounter: Payer: Self-pay | Admitting: Oncology

## 2023-02-03 NOTE — Progress Notes (Signed)
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 female is here because of  malignant melanoma Medical history notable for tobacco use, diabetes mellitus type 2, hypertension, OSA on CPAP  October 25, 2022: Presented to dermatology with a red, raised, nodular lesion on right anterior shin which have been present for over a year and a half.  It began as a dark mole and had grown slowly over the period of time.  Patient underwent excisional biopsy followed by electrodesiccation and curettage Pathology demonstrated malignant melanoma at least 5 mm thick.  Deep margin was involved ulceration present mitotic index 5/mm.  Tumor infiltrating lymphocyte not brisk  Patient was subsequently referred to surgery for additional management  November 23, 2022: Wide local excision of malignant melanoma with resulting 6 cm diameter circular wound; right inguinal sentinel lymph node biopsy Pathology from the excisional biopsy site demonstrated residual melanoma in situ, no residual invasive disease, LVI invasion seen, margins free of malignancy.  No change in stage from T4b.  Sentinel lymph node positive for melanoma (3 separate aggregate blocks) no extracapsular extension Pathologic stage IIIC (T4b, N1 M0)  December 12 2022:  [place]  Medical Oncology Consult  States that her FSGB's run from 185 to 215  Feels down and depressed.  Eating a lot due to her emotions.    Social:  Divorced.  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   Quit smoking since her surgery in March 2024.  Formerly smoked 1 ppd.  EtOH drinks once every few weeks.    [facility] Mother died 53 CHF Father died late 23's CHF and COPD Sister alive 15 well Brother alive 75 CAD, DM Type II, COPD   December 16 2022:  PET/CT (Melanoma protocol) No evidence of residual melanoma or metastatic melanoma on whole-body scan.  Postsurgical seroma in the RIGHT inguinal region without  hypermetabolic lymph nodes in the RIGHT groin. Mild metabolic activity associated wide local excisions surgical site RIGHT lower extremity. Two small pulmonary nodules are favored un-related to melanoma.   MRI brain No evidence of intracranial metastatic disease.   December 20 2022:   Reviewed results of imaging and labs with patient and sister.  Discussed role of adjuvant immunotherapy.  Discussed risks and benefits of this.    January 03 2023:  Cycle 1 Keytruda  January 05 2023:  Has tolerated it well.  No pruritus, rash, nausea or emesis.  Provided reassuance  January 11 2023:  Port placed.    Jan 23, 2023:   Has lost 5 lbs.  Recovering from a bout of diarrhea from the weekend of March 26th; helped by imodium.  Appetite better but overall diminished.  Slight erythematous rash on wrists, resolving.  Slight nausea, no emesis.  Sentinel LN bx site healing and no longer draining.   CMP notable for sodium 131  Glucose 260  Jan 25, 2023;  Cycle 2 Keytruda  Feb 06 2023:  Scheduled follow-up for melanoma.  Has lost 5 lbs.   States that she did not feel right after receiving last dose of Keytruda; light headed, fatigued, stomach upset (nausea/diarrhea).  Was fine the next day and did well until 3 days ago when developed GI symptoms, SOB and generalized myalgias.  Not smoking.  Using CPAP.  Has a wheeze.  No pleuritic  chest pain. Started Chantix about a month ago.  Having vivid dreams.   Feb 15 2923:  Cycle 3 Keytruda    Review of Systems  Constitutional:  Positive for fatigue. Negative for appetite change, chills and fever.       Has been gaining weight due to increased eating  HENT:   Negative for nosebleeds and voice change.        Occasional sores on tongue and sore throat.  Occasional trouble swallowing.   Eyes:  Negative for icterus.       Vision worsened since diagnosed with DM.  Has not seen opthalmologist  Respiratory:  Negative for chest tightness, cough, hemoptysis, shortness of breath and wheezing.        PND:  none Orthopnea:  none DOE:    Cardiovascular:  Positive for palpitations. Negative for chest pain and leg swelling.       PND:  none Orthopnea:  none  Gastrointestinal:  Negative for abdominal pain, blood in stool, constipation, nausea and vomiting.       Some diarrhea due to metformen   Endocrine: Negative for hot flashes.       Heat intolerance  Genitourinary:  Negative for bladder incontinence, difficulty urinating, dysuria, frequency, hematuria and nocturia.   Musculoskeletal:  Negative for arthralgias, back pain, gait problem, myalgias, neck pain and neck stiffness.  Skin:  Negative for itching, rash and wound.  Neurological:  Negative for dizziness, extremity weakness, gait problem, light-headedness, numbness and speech difficulty.       Has some stress headaches  Hematological:  Negative for adenopathy. Bruises/bleeds easily.  Psychiatric/Behavioral:  Positive for depression. Negative for suicidal ideas. The patient is not nervous/anxious.     MEDICAL HISTORY: Past Medical History:  Diagnosis Date   Cancer (HCC)    Depression    Diabetes mellitus without complication (HCC)    Hypertension    Melanoma (HCC)    Sleep apnea     SURGICAL HISTORY: Past Surgical History:  Procedure Laterality Date   CHOLECYSTECTOMY     MELANOMA EXCISION WITH SENTINEL LYMPH NODE  BIOPSY      SOCIAL HISTORY: Social History   Socioeconomic History   Marital status: Divorced    Spouse name: Not on file   Number of children: 2   Years of education: 12   Highest education level: 12th grade  Occupational History    Comment: Print production planner  Tobacco Use   Smoking status: Former    Packs/day: 0.50    Years: 30.00    Additional pack years: 0.00    Total pack years: 15.00    Types: Cigarettes    Quit date: 11/23/2022    Years since quitting: 0.1   Smokeless tobacco: Not on file  Vaping Use   Vaping Use: Some days   Substances: CBD   Devices: CBD vape occasionally  Substance and Sexual Activity   Alcohol use: Never   Drug use: No    Types: Marijuana    Comment: former marijuana smoker   Sexual activity:  Not Currently  Other Topics Concern   Not on file  Social History Narrative   Not on file   Social Determinants of Health   Financial Resource Strain: Medium Risk (12/28/2022)   Overall Financial Resource Strain (CARDIA)    Difficulty of Paying Living Expenses: Somewhat hard  Food Insecurity: No Food Insecurity (12/12/2022)   Hunger Vital Sign    Worried About Running Out of Food in the Last Year: Never true    Ran Out of Food in the Last Year: Never true  Transportation Needs: No Transportation Needs (12/12/2022)   PRAPARE - Administrator, Civil Service (Medical): No    Lack of Transportation (Non-Medical): No  Physical Activity: Inactive (12/28/2022)   Exercise Vital Sign    Days of Exercise per Week: 0 days    Minutes of Exercise per Session: 0 min  Stress: Stress Concern Present (12/28/2022)   [employer] of Occupational Health - Occupational Stress Questionnaire    Feeling of Stress : Rather much  Social Connections: Moderately Isolated (12/28/2022)   Social Connection and Isolation Panel [NHANES]    Frequency of Communication with Friends and Family: More than three times a week    Frequency of Social Gatherings with Friends  and Family: More than three times a week    Attends Religious Services: 1 to 4 times per year    Active Member of [employer] or Organizations: No    Attends Banker Meetings: Never    Marital Status: Divorced  Catering manager Violence: Not At Risk (12/12/2022)   Humiliation, Afraid, Rape, and Kick questionnaire    Fear of Current or Ex-Partner: No    Emotionally Abused: No    Physically Abused: No    Sexually Abused: No    FAMILY HISTORY No family history on file.  ALLERGIES:  has No Known Allergies.  MEDICATIONS:  Current Outpatient Medications  Medication Sig Dispense Refill   FLUoxetine (PROZAC) 20 MG tablet Take 20 mg by mouth daily.     hydrochlorothiazide (HYDRODIURIL) 25 MG tablet Take by mouth.     hydrOXYzine (ATARAX) 25 MG tablet Take 25 mg by mouth 2 (two) times daily.     lisinopril (ZESTRIL) 20 MG tablet Take 1 tablet by mouth daily.     metFORMIN (GLUCOPHAGE) 1000 MG tablet Take 1 tablet (1,000 mg total) by mouth 2 (two) times daily with a meal. 60 tablet 1   metoprolol succinate (TOPROL-XL) 25 MG 24 hr tablet Take by mouth.     ondansetron (ZOFRAN) 8 MG tablet Take 1 tablet (8 mg total) by mouth every 8 (eight) hours as needed for nausea or vomiting. 30 tablet 1   potassium chloride (MICRO-K) 10 MEQ CR capsule Take 10 mEq by mouth 2 (two) times daily.     prochlorperazine (COMPAZINE) 10 MG tablet Take 1 tablet (10 mg total) by mouth every 6 (six) hours as needed for nausea or vomiting. 30 tablet 1   TRULICITY 0.75 MG/0.5ML SOPN      Varenicline Tartrate, Starter, 0.5 MG X 11 & 1 MG X 42 TBPK Take by mouth.     No current facility-administered medications for this visit.    PHYSICAL EXAMINATION:  ECOG PERFORMANCE STATUS: 0 - Asymptomatic   There were no vitals filed for this visit.   There were no vitals filed for this visit.    Physical Exam Vitals and nursing note reviewed.  Constitutional:      Appearance: Normal appearance. She is obese.  She is not toxic-appearing or diaphoretic.     Comments: Here alone.  Tired appearing  HENT:     Head: Normocephalic and atraumatic.     Right Ear: External ear normal.     Left Ear: External ear normal.     Nose: Nose normal. No congestion or rhinorrhea.  Eyes:     General: No scleral icterus.    Extraocular Movements: Extraocular movements intact.     Conjunctiva/sclera: Conjunctivae normal.     Pupils: Pupils are equal, round, and reactive to light.  Cardiovascular:     Rate and Rhythm: Normal rate.     Heart sounds: No murmur heard.    No friction rub. No gallop.  Abdominal:     General: Bowel sounds are normal.     Palpations: Abdomen is soft.  Musculoskeletal:        General: No swelling, tenderness or deformity.     Cervical back: Normal range of motion and neck supple. No rigidity or tenderness.  Lymphadenopathy:     Head:     Right side of head: No submental, submandibular, tonsillar, preauricular, posterior auricular or occipital adenopathy.     Left side of head: No submental, submandibular, tonsillar, preauricular, posterior auricular or occipital adenopathy.     Cervical: No cervical adenopathy.     Right cervical: No superficial, deep or posterior cervical adenopathy.    Left cervical: No superficial, deep or posterior cervical adenopathy.     Upper Body:     Right upper body: No supraclavicular, axillary, pectoral or epitrochlear adenopathy.     Left upper body: No supraclavicular, axillary, pectoral or epitrochlear adenopathy.  Skin:    General: Skin is warm and dry.     Coloration: Skin is not jaundiced.     Comments: Flushing of cheeks  Neurological:     General: No focal deficit present.     Mental Status: She is alert and oriented to person, place, and time. Mental status is at baseline.     Cranial Nerves: No cranial nerve deficit.  Psychiatric:        Mood and Affect: Mood normal.        Behavior: Behavior normal.        Thought Content: Thought  content normal.        Judgment: Judgment normal.     Comments: Anxious    LABORATORY DATA: I have personally reviewed the data as listed:  Appointment on 01/23/2023  Component Date Value Ref Range Status   Sodium 01/23/2023 131 (L)  135 - 145 mmol/L Final   Potassium 01/23/2023 3.9  3.5 - 5.1 mmol/L Final   Chloride 01/23/2023 100  98 - 111 mmol/L Final   CO2 01/23/2023 21 (L)  22 - 32 mmol/L Final   Glucose, Bld 01/23/2023 260 (H)  70 - 99 mg/dL Final   Glucose reference range applies only to samples taken after fasting for at least 8 hours.   BUN 01/23/2023 15  6 - 20 mg/dL Final   Creatinine 16/06/9603 0.81  0.44 - 1.00 mg/dL Final   Calcium 54/05/8118 8.9  8.9 - 10.3 mg/dL Final   Total Protein 14/78/2956 6.8  6.5 - 8.1 g/dL Final   Albumin 21/30/8657 3.6  3.5 - 5.0 g/dL Final   AST 84/69/6295 21  15 - 41 U/L Final   ALT 01/23/2023 23  0 - 44 U/L Final   Alkaline Phosphatase 01/23/2023 89  38 - 126 U/L Final   Total Bilirubin 01/23/2023  0.4  0.3 - 1.2 mg/dL Final   GFR, Estimated 01/23/2023 >60  >60 mL/min Final   Comment: (NOTE) Calculated using the CKD-EPI Creatinine Equation (2021)    Anion gap 01/23/2023 10  5 - 15 Final   Performed at [facility], [address], [place], [place] [address]   WBC Count 01/23/2023 10.0  4.0 - 10.5 K/uL Final   RBC 01/23/2023 4.68  3.87 - 5.11 MIL/uL Final   Hemoglobin 01/23/2023 13.3  12.0 - 15.0 g/dL Final   HCT 60/45/4098 39.5  36.0 - 46.0 % Final   MCV 01/23/2023 84.4  80.0 - 100.0 fL Final   MCH 01/23/2023 28.4  26.0 - 34.0 pg Final   MCHC 01/23/2023 33.7  30.0 - 36.0 g/dL Final   RDW 11/91/4782 13.4  11.5 - 15.5 % Final   Platelet Count 01/23/2023 248  150 - 400 K/uL Final   nRBC 01/23/2023 0.0  0.0 - 0.2 % Final   Neutrophils Relative % 01/23/2023 67  % Final   Neutro Abs 01/23/2023 6.7  1.7 - 7.7 K/uL Final   Lymphocytes Relative 01/23/2023 20  % Final   Lymphs Abs 01/23/2023 2.0  0.7 - 4.0 K/uL Final    Monocytes Relative 01/23/2023 8  % Final   Monocytes Absolute 01/23/2023 0.8  0.1 - 1.0 K/uL Final   Eosinophils Relative 01/23/2023 4  % Final   Eosinophils Absolute 01/23/2023 0.4  0.0 - 0.5 K/uL Final   Basophils Relative 01/23/2023 1  % Final   Basophils Absolute 01/23/2023 0.1  0.0 - 0.1 K/uL Final   Immature Granulocytes 01/23/2023 0  % Final   Abs Immature Granulocytes 01/23/2023 0.04  0.00 - 0.07 K/uL Final   Performed at [facility], [address] [place], [place] [address]   Preg, Serum 01/23/2023 NEGATIVE  NEGATIVE Final   Comment:        THE SENSITIVITY OF THIS METHODOLOGY IS >10 mIU/mL. Performed at [facility], [address], [place], [place] [address]     RADIOGRAPHIC STUDIES: I have personally reviewed the radiological images as listed and agree with the findings in the report  No results found.  ASSESSMENT/PLAN 47 y.o. female is here because of  malignant melanoma.  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  Biopsied November 23 2022- Wide local excision and sentinel LN bx.  (Patient did not have clinically apparent LN's by history, nor in transit disease) December 12 2022- Obtain BRAF mutation studies on surgical material December 16 2022 -PET/CT whole body.  No residual melanoma or metastatic disease.  Two small pulmonary nodules thought unrelated. MRI brain negative  Therapeutics   December 12 2022- Discussed use of adjuvant therapy to prevent recurrence.   December 20 2022- Per NCCN guidelines recommended adjuvant Pembrolizumab  January 03 2023- Cycle 1 Keytruda  Jan 23 2023- Slight skin rash which is being managed with low potency topical steroids  Jan 25 2023- Cycle 2 Keytruda  Feb 06 2023- Obtain labs to evaluate for  possible autoimmune symptoms from [facility] (which have been mild)  Feb 15 2023- Keytruda  Vivid dreams  Feb 06 2023- Began after starting Chantix for smoking cessation.  This is a known side effect.    Poor venous access:  December 22 2022- Portacath placed  Post operative seroma-  December 20 2022- Mild and should resolve with conservative management.  A common problem with inguinal lymphadenectomy.  Other risk factors are obesity, DM Type II and tobacco use  Jan 23 2023- Resolved with conservative management    Cancer Staging  Malignant melanoma of right lower leg [facility]) Staging form: Melanoma of the Skin, AJCC 8th Edition - Clinical stage from 12/12/2022: Stage III (cT4b, cN1a, cM0) - Signed by [name], MD on 12/12/2022 Histopathologic type: Nodular melanoma Stage prefix: Initial diagnosis    No problem-specific Assessment & Plan notes found for this encounter.    No orders of the defined types were placed in this encounter.   30  minutes was spent in patient care.  This included time spent preparing to see the patient (e.g., review of tests), obtaining and/or reviewing separately obtained history, counseling and educating the patient/family/caregiver, ordering tests, or procedures; documenting clinical information in the electronic or other health record, independently interpreting results and communicating results to the patient/family/caregiver as well as coordination of care.       All questions were answered. The patient knows to call the clinic with any problems, questions or concerns.  This note was electronically signed.    [name], MD  02/03/2023 1:22 PM

## 2023-02-06 ENCOUNTER — Inpatient Hospital Stay: Payer: 59

## 2023-02-06 ENCOUNTER — Inpatient Hospital Stay (HOSPITAL_BASED_OUTPATIENT_CLINIC_OR_DEPARTMENT_OTHER): Payer: 59 | Admitting: Oncology

## 2023-02-06 VITALS — BP 181/88 | HR 95 | Temp 99.1°F | Resp 16 | Ht 65.5 in | Wt 296.6 lb

## 2023-02-06 DIAGNOSIS — F515 Nightmare disorder: Secondary | ICD-10-CM

## 2023-02-06 DIAGNOSIS — Z5112 Encounter for antineoplastic immunotherapy: Secondary | ICD-10-CM

## 2023-02-06 DIAGNOSIS — C4371 Malignant melanoma of right lower limb, including hip: Secondary | ICD-10-CM

## 2023-02-06 DIAGNOSIS — Z79899 Other long term (current) drug therapy: Secondary | ICD-10-CM | POA: Diagnosis not present

## 2023-02-06 LAB — COMPREHENSIVE METABOLIC PANEL
ALT: 23 U/L (ref 0–44)
AST: 24 U/L (ref 15–41)
Albumin: 3.6 g/dL (ref 3.5–5.0)
Alkaline Phosphatase: 80 U/L (ref 38–126)
Anion gap: 9 (ref 5–15)
BUN: 13 mg/dL (ref 6–20)
CO2: 23 mmol/L (ref 22–32)
Calcium: 8.9 mg/dL (ref 8.9–10.3)
Chloride: 102 mmol/L (ref 98–111)
Creatinine, Ser: 0.75 mg/dL (ref 0.44–1.00)
GFR, Estimated: >60 mL/min (ref >60)
Glucose, Bld: 215 mg/dL — ABNORMAL HIGH (ref 70–99)
Potassium: 3.8 mmol/L (ref 3.5–5.1)
Sodium: 134 mmol/L — ABNORMAL LOW (ref 135–145)
Total Bilirubin: 0.6 mg/dL (ref 0.3–1.2)
Total Protein: 7.1 g/dL (ref 6.5–8.1)

## 2023-02-06 LAB — CBC WITH DIFFERENTIAL/PLATELET
Abs Immature Granulocytes: 0.04 10*3/uL (ref 0.00–0.07)
Basophils Absolute: 0.1 10*3/uL (ref 0.0–0.1)
Basophils Relative: 1 %
Eosinophils Absolute: 0.4 10*3/uL (ref 0.0–0.5)
Eosinophils Relative: 4 %
HCT: 40.7 % (ref 36.0–46.0)
Hemoglobin: 13.6 g/dL (ref 12.0–15.0)
Immature Granulocytes: 0 %
Lymphocytes Relative: 21 %
Lymphs Abs: 1.9 10*3/uL (ref 0.7–4.0)
MCH: 28.9 pg (ref 26.0–34.0)
MCHC: 33.4 g/dL (ref 30.0–36.0)
MCV: 86.4 fL (ref 80.0–100.0)
Monocytes Absolute: 0.7 10*3/uL (ref 0.1–1.0)
Monocytes Relative: 7 %
Neutro Abs: 5.9 10*3/uL (ref 1.7–7.7)
Neutrophils Relative %: 67 %
Platelets: 239 10*3/uL (ref 150–400)
RBC: 4.71 MIL/uL (ref 3.87–5.11)
RDW: 13.4 % (ref 11.5–15.5)
WBC: 9 10*3/uL (ref 4.0–10.5)
nRBC: 0 % (ref 0.0–0.2)

## 2023-02-06 LAB — SEDIMENTATION RATE: Sed Rate: 17 mm/hr (ref 0–22)

## 2023-02-06 LAB — CORTISOL: Cortisol, Plasma: 4.6 ug/dL

## 2023-02-06 LAB — C-REACTIVE PROTEIN: CRP: 2.3 mg/dL — ABNORMAL HIGH (ref <1.0)

## 2023-02-06 LAB — HCG, SERUM, QUALITATIVE: Preg, Serum: NEGATIVE

## 2023-02-06 LAB — TSH: TSH: 0.879 u[IU]/mL (ref 0.350–4.500)

## 2023-02-06 LAB — T4, FREE: Free T4: 1.19 ng/dL — ABNORMAL HIGH (ref 0.61–1.12)

## 2023-02-06 LAB — CK: Total CK: 40 U/L (ref 38–234)

## 2023-02-07 ENCOUNTER — Other Ambulatory Visit: Payer: Self-pay | Admitting: Pharmacist

## 2023-02-07 DIAGNOSIS — C4371 Malignant melanoma of right lower limb, including hip: Secondary | ICD-10-CM

## 2023-02-07 LAB — ANA COMPREHENSIVE PANEL

## 2023-02-07 LAB — RHEUMATOID FACTOR: Rheumatoid fact SerPl-aCnc: <10 IU/mL (ref <14.0)

## 2023-02-08 ENCOUNTER — Ambulatory Visit (INDEPENDENT_AMBULATORY_CARE_PROVIDER_SITE_OTHER)
Admission: RE | Admit: 2023-02-08 | Discharge: 2023-02-08 | Disposition: A | Discharge status: Home / Self Care | Payer: 59 | Source: Ambulatory Visit | Attending: Nurse Practitioner | Admitting: Nurse Practitioner

## 2023-02-08 ENCOUNTER — Encounter: Payer: Self-pay | Admitting: Nurse Practitioner

## 2023-02-08 ENCOUNTER — Ambulatory Visit (INDEPENDENT_AMBULATORY_CARE_PROVIDER_SITE_OTHER): Payer: 59 | Admitting: Nurse Practitioner

## 2023-02-08 VITALS — BP 122/80 | HR 89 | Temp 98.5°F | Ht 65.0 in | Wt 297.8 lb

## 2023-02-08 DIAGNOSIS — J209 Acute bronchitis, unspecified: Secondary | ICD-10-CM

## 2023-02-08 DIAGNOSIS — R062 Wheezing: Secondary | ICD-10-CM | POA: Diagnosis not present

## 2023-02-08 DIAGNOSIS — Z6841 Body Mass Index (BMI) 40.0 and over, adult: Secondary | ICD-10-CM

## 2023-02-08 DIAGNOSIS — R0609 Other forms of dyspnea: Secondary | ICD-10-CM | POA: Diagnosis not present

## 2023-02-08 DIAGNOSIS — G4733 Obstructive sleep apnea (adult) (pediatric): Secondary | ICD-10-CM

## 2023-02-08 DIAGNOSIS — R0602 Shortness of breath: Secondary | ICD-10-CM | POA: Diagnosis not present

## 2023-02-08 DIAGNOSIS — C4371 Malignant melanoma of right lower limb, including hip: Secondary | ICD-10-CM

## 2023-02-08 MED ORDER — AZITHROMYCIN 250 MG PO TABS
ORAL_TABLET | ORAL | 0 refills | Status: DC
Start: 2023-02-08 — End: 2023-02-14

## 2023-02-08 MED ORDER — ALBUTEROL SULFATE HFA 108 (90 BASE) MCG/ACT IN AERS
2.0000 | INHALATION_SPRAY | Freq: Four times a day (QID) | RESPIRATORY_TRACT | 2 refills | Status: AC | PRN
Start: 2023-02-08 — End: ?

## 2023-02-08 MED ORDER — PREDNISONE 20 MG PO TABS
40.0000 mg | ORAL_TABLET | Freq: Every day | ORAL | 0 refills | Status: AC
Start: 2023-02-08 — End: 2023-02-13

## 2023-02-08 NOTE — Patient Instructions (Addendum)
Continue to use CPAP every night, minimum of 4-6 hours a night.  Change equipment every 30 days or as directed by DME. Wash your tubing with warm soap and water daily, hang to dry. Wash humidifier portion weekly.  Be aware of reduced alertness and do not drive or operate heavy machinery if experiencing this or drowsiness.  Exercise encouraged, as tolerated. Notify if persistent daytime sleepiness occurs even with consistent use of CPAP.  We discussed how untreated sleep apnea puts an individual at risk for cardiac arrhthymias, pulm HTN, DM, stroke and increases their risk for daytime accidents. We also briefly reviewed treatment options including weight loss, side sleeping position, oral appliance, CPAP therapy or referral to ENT for possible surgical options  Home sleep study ordered so we can evaluate severity of your OSA and obtain a new CPAP machine - someone will contact you to schedule this.  Prednisone 40 mg daily for 5 days. Take in AM with food. Azithromycin 2 tabs on day one then 1 tab daily for four additional days. Take with food  Albuterol 2 puffs every 6 hours as needed for shortness of breath or wheezing   Chest x ray today   Follow up in 6-8 weeks after PFTs with Dr. Wynona Neat (1st). I will call you about your sleep study results and place orders for new CPAP accordingly. If symptoms do not improve or worsen, please contact office for sooner follow up or seek emergency care.

## 2023-02-08 NOTE — Progress Notes (Signed)
@Patient  ID: Diana Odom, female    DOB: 1975-12-19, 47 y.o.   MRN: [id number]  Chief Complaint  Patient presents with   Consult    Pt was to get reestablished with sleep doctor, pt is wanting to get a new cpap machine.      Referring provider: Charlott Rakes, MD  HPI: 47 year old female, former smoker referred for sleep consult. Past medical history significant for OSA on CPAP, malignant melanoma stage III on immunotherapy, obesity.   TEST/EVENTS:   02/08/2023: Today - sleep consult  Patient presents today for sleep consult but she is also having some respiratory concerns she would like to discuss.  She is already on CPAP therapy.  Her original sleep study was sometime around 2010.  She has her original machine.  Currently sleeps with nasal pillow mask.  She has been ordering her supplies online.  She does not have a current DME company.  She thinks her pressures are set somewhere around 12.  She does think she is due for a new machine.  She does not have an SD card in her current 1.  Does not have access to her previous sleep study results.  She sleeps well with her CPAP for the most part.  She does have some daytime fatigue but attributes this to her cancer. Denies any morning headaches or drowsy driving. She goes to bed around 10 to 11 PM.  Falls asleep within 20 to 40 minutes.  Wakes 2 times a night usually to use the restroom.  Officially gets up around 7 AM.  No significant leaks from her CPAP.  No sleep aids.  Weight is stable over the last 2 years.  She does not wear any oxygen. She has a history of high blood pressure, well-controlled on antihypertensives.  No history of stroke or diabetes.  She is a former smoker.  Quit 2 months ago.  No alcohol intake.  No excessive caffeine intake.  Lives by herself.   She has stage III malignant melanoma and is currently on Keytruda.  She has noticed that over the past few weeks, she has had more wheezing and shortness of breath.  The  shortness of breath is with exertion.  Does not notice it at rest.  Occasionally has some chest tightness.  She does have a cough which is occasionally productive with yellow phlegm.  She has been told in the past that she has emphysema but has never been on any sort of maintenance inhaler.  She does not have a history of asthma.  She does get bronchitis every couple of years.  Denies any fevers, chills, hemoptysis, lower extremity swelling, orthopnea, palpitations, chest pain.  She did discuss this with her oncologist who did not seem concerned that it was related to her Keytruda.  Thought that maybe it was due to her Chantix.  This has helped her quit smoking so she does not want to stop it.  She has not had any antibiotics or steroids for her cough.  Not taking anything over-the-counter.  Epworth 10  No Known Allergies   There is no immunization history on file for this patient.  Past Medical History:  Diagnosis Date   Cancer (HCC)    Depression    Diabetes mellitus without complication (HCC)    Hypertension    Melanoma (HCC)    Sleep apnea     Tobacco History: Social History   Tobacco Use  Smoking Status Former   Packs/day: 0.50  Years: 30.00   Additional pack years: 0.00   Total pack years: 15.00   Types: Cigarettes   Quit date: 11/23/2022   Years since quitting: 0.2  Smokeless Tobacco Not on file   Counseling given: Not Answered   Outpatient Medications Prior to Visit  Medication Sig Dispense Refill   FLUoxetine (PROZAC) 20 MG tablet Take 20 mg by mouth daily.     hydrochlorothiazide (HYDRODIURIL) 25 MG tablet Take by mouth.     hydrOXYzine (ATARAX) 25 MG tablet Take 25 mg by mouth 2 (two) times daily.     lisinopril (ZESTRIL) 20 MG tablet Take 1 tablet by mouth daily.     metFORMIN (GLUCOPHAGE) 1000 MG tablet Take 1 tablet (1,000 mg total) by mouth 2 (two) times daily with a meal. 60 tablet 1   metoprolol succinate (TOPROL-XL) 25 MG 24 hr tablet Take by mouth.      ondansetron (ZOFRAN) 8 MG tablet Take 1 tablet (8 mg total) by mouth every 8 (eight) hours as needed for nausea or vomiting. 30 tablet 1   potassium chloride (MICRO-K) 10 MEQ CR capsule Take 10 mEq by mouth 2 (two) times daily.     prochlorperazine (COMPAZINE) 10 MG tablet Take 1 tablet (10 mg total) by mouth every 6 (six) hours as needed for nausea or vomiting. 30 tablet 1   TRULICITY 0.75 MG/0.5ML SOPN      Varenicline Tartrate, Starter, 0.5 MG X 11 & 1 MG X 42 TBPK Take by mouth.     No facility-administered medications prior to visit.     Review of Systems:   Constitutional: No weight loss or gain, night sweats, fevers, chills, or lassitude. +fatigue  HEENT: No headaches, difficulty swallowing, tooth/dental problems, or sore throat. No sneezing, itching, ear ache, nasal congestion, or post nasal drip CV:  No chest pain, orthopnea, PND, swelling in lower extremities, anasarca, dizziness, palpitations, syncope Resp: +shortness of breath with exertion; wheezing; productive cough. No hemoptysis. No chest wall deformity GI:  No heartburn, indigestion, abdominal pain, nausea, vomiting, diarrhea, change in bowel habits, loss of appetite, bloody stools.  GU: No dysuria, change in color of urine, urgency or frequency.  No flank pain, no hematuria  Skin: No rash, lesions, ulcerations MSK:  No joint pain or swelling.   Neuro: No dizziness or lightheadedness.  Psych: No depression or anxiety. Mood stable.     Physical Exam:  BP 122/80   Pulse 89   Temp 98.5 F (36.9 C) (Oral)   Ht 5\' 5"  (1.651 m)   Wt 297 lb 12.8 oz (135.1 kg)   LMP 02/08/2023 (Exact Date)   SpO2 97%   BMI 49.56 kg/m   GEN: Pleasant, interactive, well-kempt; morbidly obese; in no acute distress. HEENT:  Normocephalic and atraumatic. PERRLA. Sclera white. Nasal turbinates pink, moist and patent bilaterally. No rhinorrhea present. Oropharynx pink and moist, without exudate or edema. No lesions, ulcerations, or postnasal  drip. Mallampati IV NECK:  Supple w/ fair ROM. No JVD present. Normal carotid impulses w/o bruits. Thyroid symmetrical with no goiter or nodules palpated. No lymphadenopathy.   CV: RRR, no m/r/g, no peripheral edema. Pulses intact, +2 bilaterally. No cyanosis, pallor or clubbing. PULMONARY:  Unlabored, regular breathing. Expiratory wheezes bilaterally A&P. No accessory muscle use.  GI: BS present and normoactive. Soft, non-tender to palpation. No organomegaly or masses detected.  MSK: No erythema, warmth or tenderness. Cap refil <2 sec all extrem. No deformities or joint swelling noted.  Neuro: A/Ox3. No focal deficits  noted.   Skin: Warm, no lesions or rashe Psych: Normal affect and behavior. Judgement and thought content appropriate.     Lab Results:  CBC    Component Value Date/Time   WBC 9.0 02/06/2023 1039   RBC 4.71 02/06/2023 1039   HGB 13.6 02/06/2023 1039   HGB 13.3 01/23/2023 0932   HCT 40.7 02/06/2023 1039   PLT 239 02/06/2023 1039   PLT 248 01/23/2023 0932   MCV 86.4 02/06/2023 1039   MCH 28.9 02/06/2023 1039   MCHC 33.4 02/06/2023 1039   RDW 13.4 02/06/2023 1039   LYMPHSABS 1.9 02/06/2023 1039   MONOABS 0.7 02/06/2023 1039   EOSABS 0.4 02/06/2023 1039   BASOSABS 0.1 02/06/2023 1039    BMET    Component Value Date/Time   NA 134 (L) 02/06/2023 1039   K 3.8 02/06/2023 1039   CL 102 02/06/2023 1039   CO2 23 02/06/2023 1039   GLUCOSE 215 (H) 02/06/2023 1039   BUN 13 02/06/2023 1039   CREATININE 0.75 02/06/2023 1039   CREATININE 0.81 01/23/2023 0932   CALCIUM 8.9 02/06/2023 1039   GFRNONAA >60 02/06/2023 1039   GFRNONAA >60 01/23/2023 0932   GFRAA  09/28/2008 0727    >60        The eGFR has been calculated using the MDRD equation. This calculation has not been validated in all clinical situations. eGFR's persistently <60 mL/min signify possible Chronic Kidney Disease.    BNP No results found for: "BNP"   Imaging:  DG Chest 2 View  Result  Date: 02/08/2023 CLINICAL DATA:  Wheezing, shortness of breath on Keytruda EXAM: CHEST - 2 VIEW COMPARISON:  01/11/2023 FINDINGS: Stable right IJ port catheter to the mid SVC.  Lungs are clear. Heart size and mediastinal contours are within normal limits. No effusion. Visualized bones unremarkable. IMPRESSION: No acute cardiopulmonary disease. Electronically Signed   By: Corlis Leak M.D.   On: 02/08/2023 14:56    heparin lock flush 100 unit/mL     Date Action Dose Route User   01/25/2023 1108 Given 500 Units Intracatheter ([address] ) Pervis Hocking T, RN      pembrolizumab [facility]) 200 mg in sodium chloride 0.9 % 50 mL chemo infusion     Date Action Dose Route User   01/03/2023 1242 Rate/Dose Change (none) Intravenous Posey Rea, RN   01/03/2023 1242 Rate/Dose Change (none) Intravenous Posey Rea, RN   01/03/2023 1240 Rate/Dose Change (none) Intravenous Posey Rea, RN   01/03/2023 1239 Rate/Dose Change (none) Intravenous Posey Rea, RN   01/03/2023 1203 Infusion Verify (none) Intravenous Posey Rea, RN      pembrolizumab [facility]) 200 mg in sodium chloride 0.9 % 50 mL chemo infusion     Date Action Dose Route User   01/25/2023 1022 Infusion Verify (none) Intravenous Lajean Saver, RN   01/25/2023 1022 New Bag/Given 200 mg Intravenous Lajean Saver, RN      0.9 %  sodium chloride infusion     Date Action Dose Route User   01/03/2023 1248 Rate/Dose Change (none) Intravenous Posey Rea, RN   01/03/2023 1243 Rate/Dose Change (none) Intravenous Posey Rea, RN   01/03/2023 1200 New Bag/Given (none) Intravenous Doreene Nest M, RN      0.9 %  sodium chloride infusion     Date Action Dose Route User   01/25/2023 1106 Rate/Dose Change (none) Intravenous Lajean Saver, RN   01/25/2023 1100 Rate/Dose Change (none) Intravenous Pervis Hocking  T, RN   01/25/2023 1008 Infusion Verify (none) Intravenous Lajean Saver, RN   01/25/2023 1008 New Bag/Given (none) Intravenous Lajean Saver,  RN      sodium chloride flush (NS) 0.9 % injection 10 mL     Date Action Dose Route User   01/25/2023 1107 Given 10 mL Intracatheter [facility] ) Pervis Hocking T, RN           No data to display          No results found for: "NITRICOXIDE"      Assessment & Plan:   OSA on CPAP OSA on CPAP. Unfortunately, we do not have access to a previous sleep study and no download available as her machine is very old. She will need to repeat a home sleep study to reassess severity of OSA and obtain a new machine. Orders placed today. Instructed to not sleep with CPAP the night of her study but otherwise, she will continue to use it until we receive results. Aware of safe driving practices.    - discussed how weight can impact sleep and risk for sleep disordered breathing - discussed options to assist with weight loss: combination of diet modification, cardiovascular and strength training exercises   - had an extensive discussion regarding the adverse health consequences related to untreated sleep disordered breathing - specifically discussed the risks for hypertension, coronary artery disease, cardiac dysrhythmias, cerebrovascular disease, and diabetes - lifestyle modification discussed   - discussed how sleep disruption can increase risk of accidents, particularly when driving - safe driving practices were discussed  Patient Instructions  Continue to use CPAP every night, minimum of 4-6 hours a night.  Change equipment every 30 days or as directed by DME. Wash your tubing with warm soap and water daily, hang to dry. Wash humidifier portion weekly.  Be aware of reduced alertness and do not drive or operate heavy machinery if experiencing this or drowsiness.  Exercise encouraged, as tolerated. Notify if persistent daytime sleepiness occurs even with consistent use of CPAP.  We discussed how untreated sleep apnea puts an individual at risk for cardiac arrhthymias, pulm HTN, DM, stroke and  increases their risk for daytime accidents. We also briefly reviewed treatment options including weight loss, side sleeping position, oral appliance, CPAP therapy or referral to ENT for possible surgical options  Home sleep study ordered so we can evaluate severity of your OSA and obtain a new CPAP machine - someone will contact you to schedule this.  Prednisone 40 mg daily for 5 days. Take in AM with food. Azithromycin 2 tabs on day one then 1 tab daily for four additional days. Take with food  Albuterol 2 puffs every 6 hours as needed for shortness of breath or wheezing   Chest x ray today   Follow up in 6-8 weeks after PFTs with Dr. Wynona Neat (1st). I will call you about your sleep study results and place orders for new CPAP accordingly. If symptoms do not improve or worsen, please contact office for sooner follow up or seek emergency care.    Morbid obesity with BMI of 45.0-49.9, adult (HCC) Healthy weight loss encouraged.   Acute bronchitis Possible acute bronchitis vs AECOPD/emphysema vs lung toxicity from [facility]. We will treat her for acute process with steroids and empiric azithromycin. CXR today for further evaluation. Given her smoking history, recommend PFTs to evaluate for smoking related obstructive lung disease. If no improvement, may consider CT imaging for further evaluation. Provided with SABA for  rescue. Action plan in place.   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 See above. Follow up with oncology as scheduled.   DOE (dyspnea on exertion) See above. If no improvement with above plan and no evidence of pneumonitis, could consider further cardiac workup and rule out of PH given history of OSA.    I spent 45 minutes of dedicated to the care of this patient on the date of this encounter to include pre-visit review of records, face-to-face time with the patient discussing conditions above, post visit ordering of testing, clinical documentation with the  electronic health record, making appropriate referrals as documented, and communicating necessary findings to members of the patients care team.  Noemi Chapel, NP 02/10/2023  Pt aware and understands NP's role.

## 2023-02-09 ENCOUNTER — Encounter: Payer: Self-pay | Admitting: Oncology

## 2023-02-09 ENCOUNTER — Ambulatory Visit: Payer: 59 | Admitting: Licensed Clinical Social Worker

## 2023-02-09 DIAGNOSIS — Z5112 Encounter for antineoplastic immunotherapy: Secondary | ICD-10-CM

## 2023-02-09 DIAGNOSIS — F515 Nightmare disorder: Secondary | ICD-10-CM | POA: Insufficient documentation

## 2023-02-09 NOTE — Progress Notes (Signed)
[facility] CSW Progress Note  Clinical Social Worker contacted patient by phone to appointment rescheduled for next Wednesday 02/15/2023.    Joseph Art, LCSW Clinical Social Worker [facility]

## 2023-02-10 ENCOUNTER — Encounter: Payer: Self-pay | Admitting: Nurse Practitioner

## 2023-02-10 ENCOUNTER — Other Ambulatory Visit: Payer: Self-pay

## 2023-02-10 DIAGNOSIS — J209 Acute bronchitis, unspecified: Secondary | ICD-10-CM | POA: Insufficient documentation

## 2023-02-10 DIAGNOSIS — R0609 Other forms of dyspnea: Secondary | ICD-10-CM | POA: Insufficient documentation

## 2023-02-10 DIAGNOSIS — G4733 Obstructive sleep apnea (adult) (pediatric): Secondary | ICD-10-CM | POA: Insufficient documentation

## 2023-02-10 NOTE — Assessment & Plan Note (Signed)
On Keytruda. See above. Follow up with oncology as scheduled.

## 2023-02-10 NOTE — Assessment & Plan Note (Addendum)
Possible acute bronchitis vs AECOPD/emphysema vs lung toxicity from [facility]. We will treat her for acute process with steroids and empiric azithromycin. CXR today for further evaluation. Given her smoking history, recommend PFTs to evaluate for smoking related obstructive lung disease. If no improvement, may consider CT imaging for further evaluation. Provided with SABA for rescue. Action plan in place.

## 2023-02-10 NOTE — Assessment & Plan Note (Addendum)
See above. If no improvement with above plan and no evidence of pneumonitis, could consider further cardiac workup and rule out of PH given history of OSA.

## 2023-02-10 NOTE — Assessment & Plan Note (Signed)
OSA on CPAP. Unfortunately, we do not have access to a previous sleep study and no download available as her machine is very old. She will need to repeat a home sleep study to reassess severity of OSA and obtain a new machine. Orders placed today. Instructed to not sleep with CPAP the night of her study but otherwise, she will continue to use it until we receive results. Aware of safe driving practices.    - discussed how weight can impact sleep and risk for sleep disordered breathing - discussed options to assist with weight loss: combination of diet modification, cardiovascular and strength training exercises   - had an extensive discussion regarding the adverse health consequences related to untreated sleep disordered breathing - specifically discussed the risks for hypertension, coronary artery disease, cardiac dysrhythmias, cerebrovascular disease, and diabetes - lifestyle modification discussed   - discussed how sleep disruption can increase risk of accidents, particularly when driving - safe driving practices were discussed  Patient Instructions  Continue to use CPAP every night, minimum of 4-6 hours a night.  Change equipment every 30 days or as directed by DME. Wash your tubing with warm soap and water daily, hang to dry. Wash humidifier portion weekly.  Be aware of reduced alertness and do not drive or operate heavy machinery if experiencing this or drowsiness.  Exercise encouraged, as tolerated. Notify if persistent daytime sleepiness occurs even with consistent use of CPAP.  We discussed how untreated sleep apnea puts an individual at risk for cardiac arrhthymias, pulm HTN, DM, stroke and increases their risk for daytime accidents. We also briefly reviewed treatment options including weight loss, side sleeping position, oral appliance, CPAP therapy or referral to ENT for possible surgical options  Home sleep study ordered so we can evaluate severity of your OSA and obtain a new CPAP  machine - someone will contact you to schedule this.  Prednisone 40 mg daily for 5 days. Take in AM with food. Azithromycin 2 tabs on day one then 1 tab daily for four additional days. Take with food  Albuterol 2 puffs every 6 hours as needed for shortness of breath or wheezing   Chest x ray today   Follow up in 6-8 weeks after PFTs with Dr. Wynona Neat (1st). I will call you about your sleep study results and place orders for new CPAP accordingly. If symptoms do not improve or worsen, please contact office for sooner follow up or seek emergency care.

## 2023-02-10 NOTE — Progress Notes (Unsigned)
[facility] Cancer Initial Visit:  Patient Care Team: Charlott Rakes, MD as PCP - General (Family Medicine)  CHIEF COMPLAINTS/PURPOSE OF CONSULTATION:  Oncology History  Malignant melanoma of right lower leg (HCC)  11/15/2022 Initial Diagnosis   Malignant melanoma of right lower leg (HCC)   12/12/2022 Cancer Staging   Staging form: Melanoma of the Skin, AJCC 8th Edition - Clinical stage from 12/12/2022: Stage III (cT4b, cN1a, cM0) - Signed by [name], MD on 12/12/2022 Histopathologic type: Nodular melanoma Stage prefix: Initial diagnosis   01/03/2023 -  Chemotherapy   Patient is on Treatment Plan : MELANOMA Pembrolizumab (200) q21d       HISTORY OF PRESENTING ILLNESS: Diana Odom 47 y.o. female is here because of  malignant melanoma Medical history notable for tobacco use, diabetes mellitus type 2, hypertension, OSA on CPAP  October 25, 2022: Presented to dermatology with a red, raised, nodular lesion on right anterior shin which have been present for over a year and a half.  It began as a dark mole and had grown slowly over the period of time.  Patient underwent excisional biopsy followed by electrodesiccation and curettage Pathology demonstrated malignant melanoma at least 5 mm thick.  Deep margin was involved ulceration present mitotic index 5/mm.  Tumor infiltrating lymphocyte not brisk  Patient was subsequently referred to surgery for additional management  November 23, 2022: Wide local excision of malignant melanoma with resulting 6 cm diameter circular wound; right inguinal sentinel lymph node biopsy Pathology from the excisional biopsy site demonstrated residual melanoma in situ, no residual invasive disease, LVI invasion seen, margins free of malignancy.  No change in stage from T4b.  Sentinel lymph node positive for melanoma (3 separate aggregate blocks) no extracapsular extension Pathologic stage IIIC (T4b, N1 M0)  December 12 2022:  [facility]  Medical Oncology Consult  States that her FSGB's run from 185 to 215  Feels down and depressed.  Eating a lot due to her emotions.    Social:  Divorced.  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   Quit smoking since her surgery in March 2024.  Formerly smoked 1 ppd.  EtOH drinks once every few weeks.    [facility] Mother died 31 CHF Father died late 78's CHF and COPD Sister alive 52 well Brother alive 85 CAD, DM Type II, COPD   December 16 2022:  PET/CT (Melanoma protocol) No evidence of residual melanoma or metastatic melanoma on whole-body scan.  Postsurgical seroma in the RIGHT inguinal region without  hypermetabolic lymph nodes in the RIGHT groin. Mild metabolic activity associated wide local excisions surgical site RIGHT lower extremity. Two small pulmonary nodules are favored un-related to melanoma.   MRI brain No evidence of intracranial metastatic disease.   December 20 2022:   Reviewed results of imaging and labs with patient and sister.  Discussed role of adjuvant immunotherapy.  Discussed risks and benefits of this.    January 03 2023:  Cycle 1 Keytruda  January 05 2023:  Has tolerated it well.  No pruritus, rash, nausea or emesis.  Provided reassuance  January 11 2023:  Port placed.    Jan 23, 2023:   Has lost 5 lbs.  Recovering from a bout of diarrhea from the weekend of March 26th; helped by imodium.  Appetite better but overall diminished.  Slight erythematous rash on wrists, resolving.  Slight nausea, no emesis.  Sentinel LN bx site healing and no longer draining.   CMP notable for sodium 131  Glucose 260  Jan 25, 2023;  Cycle 2 Keytruda  Feb 06 2023:  Scheduled follow-up for melanoma.  Has lost 5 lbs.   States that she did not feel right after receiving last dose of Keytruda; light headed, fatigued, stomach upset (nausea/diarrhea).  Was fine the next day and did well until 3 days ago when developed GI symptoms, SOB and generalized myalgias.  Not smoking.  Using CPAP.  Has a wheeze.  No pleuritic  chest pain. Started Chantix about a month ago.  Having vivid dreams.   Feb 15 2923:  Cycle 3 Keytruda    Review of Systems  Constitutional:  Positive for fatigue. Negative for appetite change, chills and fever.       Has been gaining weight due to increased eating  HENT:   Negative for nosebleeds and voice change.        Occasional sores on tongue and sore throat.  Occasional trouble swallowing.   Eyes:  Negative for icterus.       Vision worsened since diagnosed with DM.  Has not seen opthalmologist  Respiratory:  Negative for chest tightness, cough, hemoptysis, shortness of breath and wheezing.        PND:  none Orthopnea:  none DOE:    Cardiovascular:  Positive for palpitations. Negative for chest pain and leg swelling.       PND:  none Orthopnea:  none  Gastrointestinal:  Negative for abdominal pain, blood in stool, constipation, nausea and vomiting.       Some diarrhea due to metformen   Endocrine: Negative for hot flashes.       Heat intolerance  Genitourinary:  Negative for bladder incontinence, difficulty urinating, dysuria, frequency, hematuria and nocturia.   Musculoskeletal:  Negative for arthralgias, back pain, gait problem, myalgias, neck pain and neck stiffness.  Skin:  Negative for itching, rash and wound.  Neurological:  Negative for dizziness, extremity weakness, gait problem, light-headedness, numbness and speech difficulty.       Has some stress headaches  Hematological:  Negative for adenopathy. Bruises/bleeds easily.  Psychiatric/Behavioral:  Positive for depression. Negative for suicidal ideas. The patient is not nervous/anxious.     MEDICAL HISTORY: Past Medical History:  Diagnosis Date  . Cancer (HCC)   . Depression   . Diabetes mellitus without complication (HCC)   . Hypertension   . Melanoma (HCC)   . Sleep apnea     SURGICAL HISTORY: Past Surgical History:  Procedure Laterality Date  . CHOLECYSTECTOMY    . MELANOMA EXCISION WITH SENTINEL  LYMPH NODE BIOPSY      SOCIAL HISTORY: Social History   Socioeconomic History  . Marital status: Divorced    Spouse name: Not on file  . Number of children: 2  . Years of education: 65  . Highest education level: 12th grade  Occupational History    Comment: Print production planner  Tobacco Use  . Smoking status: Former    Packs/day: 0.50    Years: 30.00    Additional pack years: 0.00    Total pack years: 15.00    Types: Cigarettes    Quit date: 11/23/2022    Years since quitting: 0.2  . Smokeless tobacco: Not on file  Vaping Use  . Vaping Use: Some days  . Substances: CBD  . Devices: CBD vape occasionally  Substance and Sexual Activity  . Alcohol use: Never  . Drug use: No    Types: Marijuana    Comment: former marijuana smoker  . Sexual activity:  Not Currently  Other Topics Concern  . Not on file  Social History Narrative  . Not on file   Social Determinants of Health   Financial Resource Strain: Medium Risk (12/28/2022)   Overall Financial Resource Strain (CARDIA)   . Difficulty of Paying Living Expenses: Somewhat hard  Food Insecurity: No Food Insecurity (12/12/2022)   Hunger Vital Sign   . Worried About Programme researcher, broadcasting/film/video in the Last Year: Never true   . Ran Out of Food in the Last Year: Never true  Transportation Needs: No Transportation Needs (12/12/2022)   PRAPARE - Transportation   . Lack of Transportation (Medical): No   . Lack of Transportation (Non-Medical): No  Physical Activity: Inactive (12/28/2022)   Exercise Vital Sign   . Days of Exercise per Week: 0 days   . Minutes of Exercise per Session: 0 min  Stress: Stress Concern Present (12/28/2022)   [employer] of Occupational Health - Occupational Stress Questionnaire   . Feeling of Stress : Rather much  Social Connections: Moderately Isolated (12/28/2022)   Social Connection and Isolation Panel [NHANES]   . Frequency of Communication with Friends and Family: More than three times a week   . Frequency  of Social Gatherings with Friends and Family: More than three times a week   . Attends Religious Services: 1 to 4 times per year   . Active Member of Clubs or Organizations: No   . Attends Banker Meetings: Never   . Marital Status: Divorced  Catering manager Violence: Not At Risk (12/12/2022)   Humiliation, Afraid, Rape, and Kick questionnaire   . Fear of Current or Ex-Partner: No   . Emotionally Abused: No   . Physically Abused: No   . Sexually Abused: No    FAMILY HISTORY No family history on file.  ALLERGIES:  has No Known Allergies.  MEDICATIONS:  Current Outpatient Medications  Medication Sig Dispense Refill  . albuterol (VENTOLIN HFA) 108 (90 Base) MCG/ACT inhaler Inhale 2 puffs into the lungs every 6 (six) hours as needed for wheezing or shortness of breath. 8 g 2  . azithromycin (ZITHROMAX) 250 MG tablet Take 2 tablets on day one then take 1 tablet daily for four additional days 6 tablet 0  . FLUoxetine (PROZAC) 20 MG tablet Take 20 mg by mouth daily.    . hydrochlorothiazide (HYDRODIURIL) 25 MG tablet Take by mouth.    . hydrOXYzine (ATARAX) 25 MG tablet Take 25 mg by mouth 2 (two) times daily.    Marland Kitchen lisinopril (ZESTRIL) 20 MG tablet Take 1 tablet by mouth daily.    . metFORMIN (GLUCOPHAGE) 1000 MG tablet Take 1 tablet (1,000 mg total) by mouth 2 (two) times daily with a meal. 60 tablet 1  . metoprolol succinate (TOPROL-XL) 25 MG 24 hr tablet Take by mouth.    . ondansetron (ZOFRAN) 8 MG tablet Take 1 tablet (8 mg total) by mouth every 8 (eight) hours as needed for nausea or vomiting. 30 tablet 1  . potassium chloride (MICRO-K) 10 MEQ CR capsule Take 10 mEq by mouth 2 (two) times daily.    . predniSONE (DELTASONE) 20 MG tablet Take 2 tablets (40 mg total) by mouth daily with breakfast for 5 days. 10 tablet 0  . prochlorperazine (COMPAZINE) 10 MG tablet Take 1 tablet (10 mg total) by mouth every 6 (six) hours as needed for nausea or vomiting. 30 tablet 1  .  TRULICITY 0.75 MG/0.5ML SOPN     . Varenicline  Tartrate, Starter, 0.5 MG X 11 & 1 MG X 42 TBPK Take by mouth.     No current facility-administered medications for this visit.    PHYSICAL EXAMINATION:  ECOG PERFORMANCE STATUS: 0 - Asymptomatic   There were no vitals filed for this visit.   There were no vitals filed for this visit.    Physical Exam Vitals and nursing note reviewed.  Constitutional:      Appearance: Normal appearance. She is obese. She is not toxic-appearing or diaphoretic.     Comments: Here alone.  Tired appearing  HENT:     Head: Normocephalic and atraumatic.     Right Ear: External ear normal.     Left Ear: External ear normal.     Nose: Nose normal. No congestion or rhinorrhea.  Eyes:     General: No scleral icterus.    Extraocular Movements: Extraocular movements intact.     Conjunctiva/sclera: Conjunctivae normal.     Pupils: Pupils are equal, round, and reactive to light.  Cardiovascular:     Rate and Rhythm: Normal rate.     Heart sounds: No murmur heard.    No friction rub. No gallop.  Abdominal:     General: Bowel sounds are normal.     Palpations: Abdomen is soft.  Musculoskeletal:        General: No swelling, tenderness or deformity.     Cervical back: Normal range of motion and neck supple. No rigidity or tenderness.  Lymphadenopathy:     Head:     Right side of head: No submental, submandibular, tonsillar, preauricular, posterior auricular or occipital adenopathy.     Left side of head: No submental, submandibular, tonsillar, preauricular, posterior auricular or occipital adenopathy.     Cervical: No cervical adenopathy.     Right cervical: No superficial, deep or posterior cervical adenopathy.    Left cervical: No superficial, deep or posterior cervical adenopathy.     Upper Body:     Right upper body: No supraclavicular, axillary, pectoral or epitrochlear adenopathy.     Left upper body: No supraclavicular, axillary, pectoral or  epitrochlear adenopathy.  Skin:    General: Skin is warm and dry.     Coloration: Skin is not jaundiced.     Comments: Flushing of cheeks  Neurological:     General: No focal deficit present.     Mental Status: She is alert and oriented to person, place, and time. Mental status is at baseline.     Cranial Nerves: No cranial nerve deficit.  Psychiatric:        Mood and Affect: Mood normal.        Behavior: Behavior normal.        Thought Content: Thought content normal.        Judgment: Judgment normal.     Comments: Anxious    LABORATORY DATA: I have personally reviewed the data as listed:  Appointment on 02/06/2023  Component Date Value Ref Range Status  . Cortisol, Plasma 02/06/2023 4.6  ug/dL Final   Comment: (NOTE) AM    6.7 - 22.6 ug/dL PM   <78.2       ug/dL Performed at [facility], [address] [place], [place] [address]   . Free T4 02/06/2023 1.19 (H)  0.61 - 1.12 ng/dL Final   Comment: (NOTE) Biotin ingestion may interfere with free T4 tests. If the results are inconsistent with the TSH level, previous test results, or the clinical presentation, then consider biotin  interference. If needed, order repeat testing after stopping biotin. Performed at [facility], [address], [place], [place] [address]   . TSH 02/06/2023 0.879  0.350 - 4.500 uIU/mL Final   Comment: Performed by a 3rd Generation assay with a functional sensitivity of <=0.01 uIU/mL. Performed at [facility], [address], [place], [place] [address]   . Sed Rate 02/06/2023 17  0 - 22 mm/hr Final   Performed at [facility], [address] [place], [place] [address]  . ds DNA Ab 02/06/2023 <1  0 - 9 IU/mL Final   Comment: (NOTE)                                   Negative      <5                                   Equivocal  5 - 9                                   Positive      >9   . Ribonucleic Protein 02/06/2023 0.2  0.0 - 0.9 AI Final   . ENA SM Ab Ser-aCnc 02/06/2023 <0.2  0.0 - 0.9 AI Final  . Scleroderma (Scl-70) (ENA) Antibod* 02/06/2023 <0.2  0.0 - 0.9 AI Final  . SSA (Ro) (ENA) Antibody, IgG 02/06/2023 <0.2  0.0 - 0.9 AI Final  . SSB (La) (ENA) Antibody, IgG 02/06/2023 <0.2  0.0 - 0.9 AI Final  . Chromatin Ab SerPl-aCnc 02/06/2023 <0.2  0.0 - 0.9 AI Final  . Anti JO-1 02/06/2023 <0.2  0.0 - 0.9 AI Final  . Centromere Ab Screen 02/06/2023 <0.2  0.0 - 0.9 AI Final  . See below: 02/06/2023 Comment   Final   Comment: (NOTE) Autoantibody                       Disease Association ------------------------------------------------------------                        Condition                  Frequency ---------------------   ------------------------   --------- Antinuclear Antibody,    SLE, mixed connective Direct (ANA-D)           tissue diseases ---------------------   ------------------------   --------- dsDNA                    SLE                        40 - 60% ---------------------   ------------------------   --------- Chromatin                Drug induced SLE                90%                         SLE                        48 - 97% ---------------------   ------------------------   ---------  SSA (Ro)                 SLE                        25 - 35%                         Sjogren's Syndrome         40 - 70%                         Neonatal Lupus                 100% ---------------------   ------------------------   --------- SSB (La)                 SLE                                                       10%                         Sjogren's Syndrome              30% ---------------------   -----------------------    --------- Sm (anti-Smith)          SLE                        15 - 30% ---------------------   -----------------------    --------- RNP                      Mixed Connective Tissue                         Disease                         95% (U1 nRNP,                SLE                         30 - 50% anti-ribonucleoprotein)  Polymyositis and/or                         Dermatomyositis                 20% ---------------------   ------------------------   --------- Scl-70 (antiDNA          Scleroderma (diffuse)      20 - 35% topoisomerase)           Crest                           13% ---------------------   ------------------------   --------- Jo-1                     Polymyositis and/or                         Dermatomyositis            20 -  40% ---------------------   ------------------------   --------- Centromere B             Scleroderma -                           Crest                         variant                         80% Performed At: [facility] [address] [place], [place] [id number] Jolene Schimke MD WU:[id number]   . Rheumatoid fact SerPl-aCnc 02/06/2023 <10.0  <14.0 IU/mL Final   Comment: (NOTE) Performed At: [facility] [address] [place], [place] [id number] Jolene Schimke MD VH:[id number]   . Total CK 02/06/2023 40  38 - 234 U/L Final   Performed at [facility], [address] [place], [place] [address]  . Preg, Serum 02/06/2023 NEGATIVE  NEGATIVE Final   Comment:        THE SENSITIVITY OF THIS METHODOLOGY IS >10 mIU/mL. Performed at [facility], [address] [place], [place] [address]   Office Visit on 02/06/2023  Component Date Value Ref Range Status  . WBC 02/06/2023 9.0  4.0 - 10.5 K/uL Final  . RBC 02/06/2023 4.71  3.87 - 5.11 MIL/uL Final  . Hemoglobin 02/06/2023 13.6  12.0 - 15.0 g/dL Final  . HCT 72/53/6644 40.7  36.0 - 46.0 % Final  . MCV 02/06/2023 86.4  80.0 - 100.0 fL Final  . MCH 02/06/2023 28.9  26.0 - 34.0 pg Final  . MCHC 02/06/2023 33.4  30.0 - 36.0 g/dL Final  . RDW 03/47/4259 13.4  11.5 - 15.5 % Final  . Platelets 02/06/2023 239  150 - 400 K/uL Final  . nRBC 02/06/2023 0.0  0.0 - 0.2 % Final  . Neutrophils Relative % 02/06/2023 67  % Final  .  Neutro Abs 02/06/2023 5.9  1.7 - 7.7 K/uL Final  . Lymphocytes Relative 02/06/2023 21  % Final  . Lymphs Abs 02/06/2023 1.9  0.7 - 4.0 K/uL Final  . Monocytes Relative 02/06/2023 7  % Final  . Monocytes Absolute 02/06/2023 0.7  0.1 - 1.0 K/uL Final  . Eosinophils Relative 02/06/2023 4  % Final  . Eosinophils Absolute 02/06/2023 0.4  0.0 - 0.5 K/uL Final  . Basophils Relative 02/06/2023 1  % Final  . Basophils Absolute 02/06/2023 0.1  0.0 - 0.1 K/uL Final  . Immature Granulocytes 02/06/2023 0  % Final  . Abs Immature Granulocytes 02/06/2023 0.04  0.00 - 0.07 K/uL Final   Performed at [facility], [address], [place], [place] [address]  . CRP 02/06/2023 2.3 (H)  <1.0 mg/dL Final   Performed at [facility], [address] [place], [place] [address]  . Sodium 02/06/2023 134 (L)  135 - 145 mmol/L Final  . Potassium 02/06/2023 3.8  3.5 - 5.1 mmol/L Final  . Chloride 02/06/2023 102  98 - 111 mmol/L Final  . CO2 02/06/2023 23  22 - 32 mmol/L Final  . Glucose, Bld 02/06/2023 215 (H)  70 - 99 mg/dL Final   Glucose reference range applies only to samples taken after fasting for at least 8 hours.  . BUN 02/06/2023 13  6 - 20 mg/dL Final  . Creatinine, Ser 02/06/2023 0.75  0.44 - 1.00 mg/dL Final  . Calcium 16/06/9603 8.9  8.9 - 10.3 mg/dL Final  . Total Protein 02/06/2023 7.1  6.5 - 8.1 g/dL Final  . Albumin 54/05/8118 3.6  3.5 - 5.0 g/dL Final  . AST 14/78/2956 24  15 - 41 U/L Final  . ALT 02/06/2023 23  0 - 44 U/L Final  . Alkaline Phosphatase 02/06/2023 80  38 - 126 U/L Final  . Total Bilirubin 02/06/2023 0.6  0.3 - 1.2 mg/dL Final  . GFR, Estimated 02/06/2023 >60  >60 mL/min Final   Comment: (NOTE) Calculated using the CKD-EPI Creatinine Equation (2021)   . Anion gap 02/06/2023 9  5 - 15 Final   Performed at [facility], [address], [place], [place] [address]  Appointment on 01/23/2023  Component Date Value Ref Range Status  .  Sodium 01/23/2023 131 (L)  135 - 145 mmol/L Final  . Potassium 01/23/2023 3.9  3.5 - 5.1 mmol/L Final  . Chloride 01/23/2023 100  98 - 111 mmol/L Final  . CO2 01/23/2023 21 (L)  22 - 32 mmol/L Final  . Glucose, Bld 01/23/2023 260 (H)  70 - 99 mg/dL Final   Glucose reference range applies only to samples taken after fasting for at least 8 hours.  . BUN 01/23/2023 15  6 - 20 mg/dL Final  . Creatinine 65/78/4696 0.81  0.44 - 1.00 mg/dL Final  . Calcium 29/52/8413 8.9  8.9 - 10.3 mg/dL Final  . Total Protein 01/23/2023 6.8  6.5 - 8.1 g/dL Final  . Albumin 24/40/1027 3.6  3.5 - 5.0 g/dL Final  . AST 25/36/6440 21  15 - 41 U/L Final  . ALT 01/23/2023 23  0 - 44 U/L Final  . Alkaline Phosphatase 01/23/2023 89  38 - 126 U/L Final  . Total Bilirubin 01/23/2023 0.4  0.3 - 1.2 mg/dL Final  . GFR, Estimated 01/23/2023 >60  >60 mL/min Final   Comment: (NOTE) Calculated using the CKD-EPI Creatinine Equation (2021)   . Anion gap 01/23/2023 10  5 - 15 Final   Performed at [facility], [address], [place], [place] [address]  . WBC Count 01/23/2023 10.0  4.0 - 10.5 K/uL Final  . RBC 01/23/2023 4.68  3.87 - 5.11 MIL/uL Final  . Hemoglobin 01/23/2023 13.3  12.0 - 15.0 g/dL Final  . HCT 59/56/3875 39.5  36.0 - 46.0 % Final  . MCV 01/23/2023 84.4  80.0 - 100.0 fL Final  . MCH 01/23/2023 28.4  26.0 - 34.0 pg Final  . MCHC 01/23/2023 33.7  30.0 - 36.0 g/dL Final  . RDW 64/33/2951 13.4  11.5 - 15.5 % Final  . Platelet Count 01/23/2023 248  150 - 400 K/uL Final  . nRBC 01/23/2023 0.0  0.0 - 0.2 % Final  . Neutrophils Relative % 01/23/2023 67  % Final  . Neutro Abs 01/23/2023 6.7  1.7 - 7.7 K/uL Final  . Lymphocytes Relative 01/23/2023 20  % Final  . Lymphs Abs 01/23/2023 2.0  0.7 - 4.0 K/uL Final  . Monocytes Relative 01/23/2023 8  % Final  . Monocytes Absolute 01/23/2023 0.8  0.1 - 1.0 K/uL Final  . Eosinophils Relative 01/23/2023 4  % Final  . Eosinophils Absolute 01/23/2023 0.4   0.0 - 0.5 K/uL Final  . Basophils Relative 01/23/2023 1  % Final  . Basophils Absolute 01/23/2023 0.1  0.0 - 0.1 K/uL Final  . Immature Granulocytes 01/23/2023 0  % Final  .  Abs Immature Granulocytes 01/23/2023 0.04  0.00 - 0.07 K/uL Final   Performed at [facility], [address] [place], [place] [address]  . Preg, Serum 01/23/2023 NEGATIVE  NEGATIVE Final   Comment:        THE SENSITIVITY OF THIS METHODOLOGY IS >10 mIU/mL. Performed at [facility], [address], [place], [place] [address]     RADIOGRAPHIC STUDIES: I have personally reviewed the radiological images as listed and agree with the findings in the report  DG Chest 2 View  Result Date: 02/08/2023 CLINICAL DATA:  Wheezing, shortness of breath on Keytruda EXAM: CHEST - 2 VIEW COMPARISON:  01/11/2023 FINDINGS: Stable right IJ port catheter to the mid SVC.  Lungs are clear. Heart size and mediastinal contours are within normal limits. No effusion. Visualized bones unremarkable. IMPRESSION: No acute cardiopulmonary disease. Electronically Signed   By: Corlis Leak M.D.   On: 02/08/2023 14:56    ASSESSMENT/PLAN 47 y.o. female is here because of  malignant melanoma.  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  Biopsied November 23 2022- Wide local excision and sentinel LN bx.  (Patient did not have clinically apparent LN's by history, nor in transit disease) December 12 2022- Obtain BRAF mutation studies on surgical material December 16 2022 -PET/CT whole body.  No residual melanoma or metastatic disease.  Two small pulmonary nodules thought unrelated. MRI brain negative  Therapeutics   December 12 2022- Discussed use of adjuvant therapy to prevent recurrence.   December 20 2022- Per [facility]  guidelines recommended adjuvant Pembrolizumab  January 03 2023- Cycle 1 Keytruda  Jan 23 2023- Slight skin rash which is being managed with low potency topical steroids  Jan 25 2023- Cycle 2 Keytruda  Feb 06 2023- Obtain labs to evaluate for possible autoimmune symptoms from [facility] (which have been mild)  Feb 15 2023- Keytruda  Vivid dreams  Feb 06 2023- Began after starting Chantix for smoking cessation.  This is a known side effect.    Poor venous access:     December 22 2022- Portacath placed  Post operative seroma-  December 20 2022- Mild and should resolve with conservative management.  A common problem with inguinal lymphadenectomy.  Other risk factors are obesity, DM Type II and tobacco use  Jan 23 2023- Resolved with conservative management    Cancer Staging  Malignant melanoma of right lower leg [facility]) Staging form: Melanoma of the Skin, AJCC 8th Edition - Clinical stage from 12/12/2022: Stage III (cT4b, cN1a, cM0) - Signed by [name], MD on 12/12/2022 Histopathologic type: Nodular melanoma Stage prefix: Initial diagnosis    No problem-specific Assessment & Plan notes found for this encounter.    No orders of the defined types were placed in this encounter.   30  minutes was spent in patient care.  This included time spent preparing to see the patient (e.g., review of tests), obtaining and/or reviewing separately obtained history, counseling and educating the patient/family/caregiver, ordering tests, or procedures; documenting clinical information in the electronic or other health record, independently interpreting results and communicating results to the patient/family/caregiver as well as coordination of care.       All questions were answered. The patient knows to call the clinic with any problems, questions or concerns.  This  note was electronically signed.    [name], MD  02/10/2023 1:03 PM

## 2023-02-10 NOTE — Assessment & Plan Note (Signed)
Healthy weight loss encouraged 

## 2023-02-14 ENCOUNTER — Inpatient Hospital Stay (HOSPITAL_BASED_OUTPATIENT_CLINIC_OR_DEPARTMENT_OTHER): Payer: 59 | Admitting: Oncology

## 2023-02-14 ENCOUNTER — Ambulatory Visit: Payer: 59

## 2023-02-14 VITALS — BP 155/73 | HR 85 | Temp 98.1°F | Resp 16 | Ht 65.0 in | Wt 299.1 lb

## 2023-02-14 DIAGNOSIS — E119 Type 2 diabetes mellitus without complications: Secondary | ICD-10-CM | POA: Insufficient documentation

## 2023-02-14 DIAGNOSIS — Z5112 Encounter for antineoplastic immunotherapy: Secondary | ICD-10-CM | POA: Diagnosis not present

## 2023-02-14 DIAGNOSIS — E1165 Type 2 diabetes mellitus with hyperglycemia: Secondary | ICD-10-CM

## 2023-02-14 DIAGNOSIS — C4371 Malignant melanoma of right lower limb, including hip: Secondary | ICD-10-CM | POA: Diagnosis not present

## 2023-02-15 ENCOUNTER — Ambulatory Visit: Payer: 59

## 2023-02-15 ENCOUNTER — Inpatient Hospital Stay: Payer: 59

## 2023-02-15 ENCOUNTER — Inpatient Hospital Stay: Payer: 59 | Admitting: Licensed Clinical Social Worker

## 2023-02-15 VITALS — BP 130/79 | HR 92 | Temp 98.3°F | Resp 16 | Wt 298.0 lb

## 2023-02-15 DIAGNOSIS — Z79899 Other long term (current) drug therapy: Secondary | ICD-10-CM | POA: Diagnosis not present

## 2023-02-15 DIAGNOSIS — C4371 Malignant melanoma of right lower limb, including hip: Secondary | ICD-10-CM

## 2023-02-15 DIAGNOSIS — Z5112 Encounter for antineoplastic immunotherapy: Secondary | ICD-10-CM | POA: Diagnosis not present

## 2023-02-15 MED ORDER — SODIUM CHLORIDE 0.9% FLUSH
10.0000 mL | INTRAVENOUS | Status: DC | PRN
  Administered 2023-02-15: 10 mL

## 2023-02-15 MED ORDER — SODIUM CHLORIDE 0.9 % IV SOLN
200.0000 mg | Freq: Once | INTRAVENOUS | Status: AC
  Administered 2023-02-15: 200 mg via INTRAVENOUS
  Filled 2023-02-15: qty 8

## 2023-02-15 MED ORDER — SODIUM CHLORIDE 0.9 % IV SOLN: Freq: Once | INTRAVENOUS | Status: AC

## 2023-02-15 MED ORDER — HEPARIN SOD (PORK) LOCK FLUSH 100 UNIT/ML IV SOLN
500.0000 [IU] | Freq: Once | INTRAVENOUS | Status: AC | PRN
  Administered 2023-02-15: 500 [IU]

## 2023-02-15 NOTE — Patient Instructions (Signed)

## 2023-02-15 NOTE — Progress Notes (Addendum)
[facility] CSW Counseling Note  Patient was referred by medical provider. Treatment type: Individual  Presenting Concerns: Patient and/or family reports the following symptoms/concerns: anxiety, depression, stress, and adjustment to diagnosis and treatment Duration of problem: 3 months; Severity of problem: moderate to severe   Orientation:oriented to person, place, time/date, situation, day of week, month of year, and year.   Affect: Depressed and Tearful Risk of harm to self or others: No plan to harm self or others  Patient and/or Family's Strengths/Protective Factors: Social connections, Concrete supports in place (healthy food, safe environments, etc.), Physical Health (exercise, healthy diet, medication compliance, etc.), and Caregiver has knowledge of parenting & child developmentAbility for insight  Active sense of humor  Average or above average intelligence  Capable of independent living  Communication skills  General fund of knowledge  Motivation for treatment/growth  Special hobby/interest  Supportive family/friends  Work skills      Goals Addressed: Patient will:  Reduce symptoms of: anxiety, depression, stress, and adjustment concerns Increase knowledge and/or ability of: coping skills, healthy habits, self-management skills, stress reduction, and community   Increase healthy adjustment to current life circumstances, Increase adequate support systems for patient/family, Improve medication compliance, and Begin healthy grieving over loss   Progress towards Goals: Progressing   Interventions: Interventions utilized:  CBT, Strength-based, Conservator, museum/gallery, Supportive, Family Systems, and Reframing      Assessment: Patient currently experiencing anxiety and depression due to recent diagnosis and treatment plan.  Patient reports feeling upset about interaction with a provider.  Discussed how to best process feelings from interaction and how to advocate for herself.  Patient reports she continues to have a difficult time asking for help and communicating with family, friends and co-workers on how she is doing. Discussed with patient about communicating concerns and needs with support system, and setting small attainable goals for each day. Discussed skills to reduce symptoms of anxiety and depression. .       Plan: Follow up with CSW: 02/22/2023 @ 2:00PM Behavioral recommendations:   Referral(s): Support group(s):  N/A         [employer], LCSW

## 2023-02-22 ENCOUNTER — Telehealth: Payer: Self-pay | Admitting: Dietician

## 2023-02-22 ENCOUNTER — Encounter: Payer: 59 | Admitting: Dietician

## 2023-02-22 ENCOUNTER — Other Ambulatory Visit: Payer: 59 | Admitting: Licensed Clinical Social Worker

## 2023-02-22 NOTE — Telephone Encounter (Signed)
Attempted to reach patient for a scheduled remote nutrition consult. Provided my cell# on voice mail to return call for his follow up nutrition consult.  Cyndi Yanina Knupp, RDN, LDN Registered Dietitian, [facility] Part Time Remote (Usual office hours: Tuesday-Thursday) Cell: [phone]   

## 2023-02-23 DIAGNOSIS — E1165 Type 2 diabetes mellitus with hyperglycemia: Secondary | ICD-10-CM | POA: Diagnosis not present

## 2023-02-23 DIAGNOSIS — Z1231 Encounter for screening mammogram for malignant neoplasm of breast: Secondary | ICD-10-CM | POA: Diagnosis not present

## 2023-02-23 DIAGNOSIS — J029 Acute pharyngitis, unspecified: Secondary | ICD-10-CM | POA: Diagnosis not present

## 2023-02-23 DIAGNOSIS — Z79899 Other long term (current) drug therapy: Secondary | ICD-10-CM | POA: Diagnosis not present

## 2023-02-23 DIAGNOSIS — Z20822 Contact with and (suspected) exposure to covid-19: Secondary | ICD-10-CM | POA: Diagnosis not present

## 2023-02-23 DIAGNOSIS — C4371 Malignant melanoma of right lower limb, including hip: Secondary | ICD-10-CM | POA: Diagnosis not present

## 2023-02-23 DIAGNOSIS — R062 Wheezing: Secondary | ICD-10-CM | POA: Diagnosis not present

## 2023-02-28 ENCOUNTER — Telehealth: Payer: Self-pay | Admitting: Oncology

## 2023-02-28 NOTE — Telephone Encounter (Signed)
02/28/23 Spoke with patient and scheduled next inf appts.

## 2023-03-02 ENCOUNTER — Other Ambulatory Visit: Payer: Self-pay

## 2023-03-02 DIAGNOSIS — Z6841 Body Mass Index (BMI) 40.0 and over, adult: Secondary | ICD-10-CM | POA: Diagnosis not present

## 2023-03-02 DIAGNOSIS — E1165 Type 2 diabetes mellitus with hyperglycemia: Secondary | ICD-10-CM | POA: Diagnosis not present

## 2023-03-02 DIAGNOSIS — I10 Essential (primary) hypertension: Secondary | ICD-10-CM | POA: Diagnosis not present

## 2023-03-02 DIAGNOSIS — C4371 Malignant melanoma of right lower limb, including hip: Secondary | ICD-10-CM

## 2023-03-02 DIAGNOSIS — R062 Wheezing: Secondary | ICD-10-CM | POA: Diagnosis not present

## 2023-03-03 ENCOUNTER — Inpatient Hospital Stay: Payer: 59 | Attending: Oncology

## 2023-03-03 DIAGNOSIS — Z5112 Encounter for antineoplastic immunotherapy: Secondary | ICD-10-CM | POA: Diagnosis not present

## 2023-03-03 DIAGNOSIS — Z79899 Other long term (current) drug therapy: Secondary | ICD-10-CM | POA: Diagnosis not present

## 2023-03-03 DIAGNOSIS — C4371 Malignant melanoma of right lower limb, including hip: Secondary | ICD-10-CM | POA: Insufficient documentation

## 2023-03-03 LAB — CBC WITH DIFFERENTIAL (CANCER CENTER ONLY)
Abs Immature Granulocytes: 0.09 10*3/uL — ABNORMAL HIGH (ref 0.00–0.07)
Basophils Absolute: 0.1 10*3/uL (ref 0.0–0.1)
Basophils Relative: 1 %
Eosinophils Absolute: 0.3 10*3/uL (ref 0.0–0.5)
Eosinophils Relative: 3 %
HCT: 39.7 % (ref 36.0–46.0)
Hemoglobin: 13.7 g/dL (ref 12.0–15.0)
Immature Granulocytes: 1 %
Lymphocytes Relative: 19 %
Lymphs Abs: 2 10*3/uL (ref 0.7–4.0)
MCH: 28.7 pg (ref 26.0–34.0)
MCHC: 34.5 g/dL (ref 30.0–36.0)
MCV: 83.1 fL (ref 80.0–100.0)
Monocytes Absolute: 0.7 10*3/uL (ref 0.1–1.0)
Monocytes Relative: 6 %
Neutro Abs: 7.7 10*3/uL (ref 1.7–7.7)
Neutrophils Relative %: 70 %
Platelet Count: 250 10*3/uL (ref 150–400)
RBC: 4.78 MIL/uL (ref 3.87–5.11)
RDW: 12.6 % (ref 11.5–15.5)
WBC Count: 10.9 10*3/uL — ABNORMAL HIGH (ref 4.0–10.5)
nRBC: 0 % (ref 0.0–0.2)

## 2023-03-03 LAB — CMP (CANCER CENTER ONLY)
ALT: 22 U/L (ref 0–44)
AST: 19 U/L (ref 15–41)
Albumin: 3.3 g/dL — ABNORMAL LOW (ref 3.5–5.0)
Alkaline Phosphatase: 76 U/L (ref 38–126)
Anion gap: 9 (ref 5–15)
BUN: 13 mg/dL (ref 6–20)
CO2: 23 mmol/L (ref 22–32)
Calcium: 8.6 mg/dL — ABNORMAL LOW (ref 8.9–10.3)
Chloride: 101 mmol/L (ref 98–111)
Creatinine: 0.81 mg/dL (ref 0.44–1.00)
GFR, Estimated: >60 mL/min (ref >60)
Glucose, Bld: 241 mg/dL — ABNORMAL HIGH (ref 70–99)
Potassium: 3.5 mmol/L (ref 3.5–5.1)
Sodium: 133 mmol/L — ABNORMAL LOW (ref 135–145)
Total Bilirubin: 0.7 mg/dL (ref 0.3–1.2)
Total Protein: 6.7 g/dL (ref 6.5–8.1)

## 2023-03-03 LAB — HCG, QUANTITATIVE, PREGNANCY: hCG, Beta Chain, Quant, S: <1 m[IU]/mL (ref <5)

## 2023-03-03 LAB — TSH: TSH: 0.777 u[IU]/mL (ref 0.350–4.500)

## 2023-03-06 DIAGNOSIS — C774 Secondary and unspecified malignant neoplasm of inguinal and lower limb lymph nodes: Secondary | ICD-10-CM | POA: Diagnosis not present

## 2023-03-06 DIAGNOSIS — C4371 Malignant melanoma of right lower limb, including hip: Secondary | ICD-10-CM | POA: Diagnosis not present

## 2023-03-07 ENCOUNTER — Ambulatory Visit: Payer: 59

## 2023-03-08 ENCOUNTER — Inpatient Hospital Stay: Payer: 59

## 2023-03-08 VITALS — BP 161/84 | HR 88 | Temp 99.0°F | Resp 16 | Ht 65.0 in | Wt 292.0 lb

## 2023-03-08 DIAGNOSIS — Z85820 Personal history of malignant melanoma of skin: Secondary | ICD-10-CM | POA: Diagnosis not present

## 2023-03-08 DIAGNOSIS — D225 Melanocytic nevi of trunk: Secondary | ICD-10-CM | POA: Diagnosis not present

## 2023-03-08 DIAGNOSIS — Z79899 Other long term (current) drug therapy: Secondary | ICD-10-CM | POA: Diagnosis not present

## 2023-03-08 DIAGNOSIS — C4371 Malignant melanoma of right lower limb, including hip: Secondary | ICD-10-CM

## 2023-03-08 DIAGNOSIS — L219 Seborrheic dermatitis, unspecified: Secondary | ICD-10-CM | POA: Diagnosis not present

## 2023-03-08 DIAGNOSIS — L918 Other hypertrophic disorders of the skin: Secondary | ICD-10-CM | POA: Diagnosis not present

## 2023-03-08 DIAGNOSIS — D485 Neoplasm of uncertain behavior of skin: Secondary | ICD-10-CM | POA: Diagnosis not present

## 2023-03-08 DIAGNOSIS — Z5112 Encounter for antineoplastic immunotherapy: Secondary | ICD-10-CM | POA: Diagnosis not present

## 2023-03-08 MED ORDER — HEPARIN SOD (PORK) LOCK FLUSH 100 UNIT/ML IV SOLN
500.0000 [IU] | Freq: Once | INTRAVENOUS | Status: AC | PRN
  Administered 2023-03-08: 500 [IU]

## 2023-03-08 MED ORDER — SODIUM CHLORIDE 0.9% FLUSH
10.0000 mL | INTRAVENOUS | Status: DC | PRN
  Administered 2023-03-08: 10 mL

## 2023-03-08 MED ORDER — SODIUM CHLORIDE 0.9 % IV SOLN: Freq: Once | INTRAVENOUS | Status: AC

## 2023-03-08 MED ORDER — SODIUM CHLORIDE 0.9 % IV SOLN
200.0000 mg | Freq: Once | INTRAVENOUS | Status: AC
  Administered 2023-03-08: 200 mg via INTRAVENOUS
  Filled 2023-03-08: qty 8

## 2023-03-08 NOTE — Patient Instructions (Signed)

## 2023-03-22 ENCOUNTER — Other Ambulatory Visit: Payer: Self-pay | Admitting: Oncology

## 2023-03-22 DIAGNOSIS — C4371 Malignant melanoma of right lower limb, including hip: Secondary | ICD-10-CM

## 2023-03-22 DIAGNOSIS — Z01818 Encounter for other preprocedural examination: Secondary | ICD-10-CM | POA: Diagnosis not present

## 2023-03-27 ENCOUNTER — Inpatient Hospital Stay: Payer: 59 | Attending: Oncology

## 2023-03-27 ENCOUNTER — Inpatient Hospital Stay (HOSPITAL_BASED_OUTPATIENT_CLINIC_OR_DEPARTMENT_OTHER): Payer: 59 | Admitting: Oncology

## 2023-03-27 ENCOUNTER — Ambulatory Visit: Payer: 59 | Admitting: Oncology

## 2023-03-27 ENCOUNTER — Other Ambulatory Visit: Payer: 59

## 2023-03-27 ENCOUNTER — Encounter: Payer: Self-pay | Admitting: Oncology

## 2023-03-27 VITALS — BP 131/73 | HR 90 | Temp 99.5°F | Resp 18 | Ht 65.0 in | Wt 292.6 lb

## 2023-03-27 DIAGNOSIS — Z5112 Encounter for antineoplastic immunotherapy: Secondary | ICD-10-CM | POA: Insufficient documentation

## 2023-03-27 DIAGNOSIS — C4371 Malignant melanoma of right lower limb, including hip: Secondary | ICD-10-CM

## 2023-03-27 DIAGNOSIS — Z79899 Other long term (current) drug therapy: Secondary | ICD-10-CM | POA: Insufficient documentation

## 2023-03-27 LAB — CBC WITH DIFFERENTIAL (CANCER CENTER ONLY)
Abs Immature Granulocytes: 0.03 10*3/uL (ref 0.00–0.07)
Basophils Absolute: 0.1 10*3/uL (ref 0.0–0.1)
Basophils Relative: 1 %
Eosinophils Absolute: 0.2 10*3/uL (ref 0.0–0.5)
Eosinophils Relative: 2 %
HCT: 40.4 % (ref 36.0–46.0)
Hemoglobin: 13.4 g/dL (ref 12.0–15.0)
Immature Granulocytes: 0 %
Lymphocytes Relative: 23 %
Lymphs Abs: 2.2 10*3/uL (ref 0.7–4.0)
MCH: 28.2 pg (ref 26.0–34.0)
MCHC: 33.2 g/dL (ref 30.0–36.0)
MCV: 85.1 fL (ref 80.0–100.0)
Monocytes Absolute: 0.9 10*3/uL (ref 0.1–1.0)
Monocytes Relative: 9 %
Neutro Abs: 6.2 10*3/uL (ref 1.7–7.7)
Neutrophils Relative %: 65 %
Platelet Count: 291 10*3/uL (ref 150–400)
RBC: 4.75 MIL/uL (ref 3.87–5.11)
RDW: 12.8 % (ref 11.5–15.5)
WBC Count: 9.6 10*3/uL (ref 4.0–10.5)
nRBC: 0 % (ref 0.0–0.2)

## 2023-03-27 LAB — CMP (CANCER CENTER ONLY)
ALT: 33 U/L (ref 0–44)
AST: 26 U/L (ref 15–41)
Albumin: 3.8 g/dL (ref 3.5–5.0)
Alkaline Phosphatase: 95 U/L (ref 38–126)
Anion gap: 9 (ref 5–15)
BUN: 11 mg/dL (ref 6–20)
CO2: 25 mmol/L (ref 22–32)
Calcium: 9.2 mg/dL (ref 8.9–10.3)
Chloride: 101 mmol/L (ref 98–111)
Creatinine: 0.79 mg/dL (ref 0.44–1.00)
GFR, Estimated: >60 mL/min (ref >60)
Glucose, Bld: 122 mg/dL — ABNORMAL HIGH (ref 70–99)
Potassium: 3.6 mmol/L (ref 3.5–5.1)
Sodium: 135 mmol/L (ref 135–145)
Total Bilirubin: 0.5 mg/dL (ref 0.3–1.2)
Total Protein: 7.4 g/dL (ref 6.5–8.1)

## 2023-03-27 LAB — TSH: TSH: 0.014 u[IU]/mL — ABNORMAL LOW (ref 0.350–4.500)

## 2023-03-27 NOTE — Progress Notes (Signed)
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 female is here because of  malignant melanoma Medical history notable for tobacco use, diabetes mellitus type 2, hypertension, OSA on CPAP  October 25, 2022: Presented to dermatology with a red, raised, nodular lesion on right anterior shin which have been present for over a year and a half.  It began as a dark mole and had grown slowly over the period of time.  Patient underwent excisional biopsy followed by electrodesiccation and curettage Pathology demonstrated malignant melanoma at least 5 mm thick.  Deep margin was involved ulceration present mitotic index 5/mm.  Tumor infiltrating lymphocyte not brisk  Patient was subsequently referred to surgery for additional management  November 23, 2022: Wide local excision of malignant melanoma with resulting 6 cm diameter circular wound; right inguinal sentinel lymph node biopsy Pathology from the excisional biopsy site demonstrated residual melanoma in situ, no residual invasive disease, LVI invasion seen, margins free of malignancy.  No change in stage from T4b.  Sentinel lymph node positive for melanoma (3 separate aggregate blocks) no extracapsular extension Pathologic stage IIIC (T4b, N1 M0)  December 12 2022:  [facility] Consult  States that her FSGB's run from 185 to 215  Feels down and depressed.  Eating a lot due to her emotions.    Social:  Divorced.  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   Quit smoking since her surgery in March 2024.  Formerly smoked 1 ppd.  EtOH drinks once every few weeks.    [facility] Mother died 32 CHF Father died late 52's CHF and COPD Sister alive 28 well Brother alive 39 CAD, DM Type II, COPD   December 16 2022:  PET/CT (Melanoma protocol) No evidence of residual melanoma or metastatic melanoma on whole-body scan.  Postsurgical seroma in the RIGHT inguinal region without  hypermetabolic lymph nodes in the RIGHT groin. Mild metabolic activity associated wide local excisions surgical site RIGHT lower extremity. Two small pulmonary nodules are favored un-related to melanoma.   MRI brain No evidence of intracranial metastatic disease.   December 20 2022:   Reviewed results of imaging and labs with patient and sister.  Discussed role of adjuvant immunotherapy.  Discussed risks and benefits of this.    January 03 2023:  Cycle 1 Keytruda  January 05 2023:  Has tolerated it well.  No pruritus, rash, nausea or emesis.  Provided reassuance  January 11 2023:  Port placed.    Jan 23, 2023:   Has lost 5 lbs.  Recovering from a bout of diarrhea from the weekend of March 26th; helped by imodium.  Appetite better but overall diminished.  Slight erythematous rash on wrists, resolving.  Slight nausea, no emesis.  Sentinel LN bx site healing and no longer draining.   CMP notable for  sodium 131  Glucose 260  Jan 25, 2023;  Cycle 2 Keytruda  Feb 06 2023:  Scheduled follow-up for melanoma.  Has lost 5 lbs.   States that she did not feel right after receiving last dose of Keytruda; light headed, fatigued, stomach upset (nausea/diarrhea).  Was fine the next day and did well until 3 days ago when developed GI symptoms, SOB and generalized myalgias.  Not smoking.  Using CPAP.  Has a wheeze.  No  pleuritic chest pain. Started Chantix about a month ago.  Having vivid dreams.   Glucose 215.  CK 40.  CRP 2.3 sed rate 17.  Plasma cortisol 4.6.  TSH 0.879.  Free T41.19.  ANA panel negative.  Rheumatoid factor negative  Feb 14, 2023:  Reviewed results of labs with patient.  Feels OK.  Has gained 2 lbs.  Last week was placed on Prednisone and Z pack for bronchitis. Not wheezing as badly since she was treated.  She has seasonal allergies.  Remains a non-smoker.    Feb 15 2923:  Cycle 3 Keytruda   March 08, 2023:  Cycle 4 Keytruda   March 27, 2023:  Scheduled follow-up for management of malignant melanoma.   Has lost 7 lbs.  Had chest heaviness and wheezing with last cycle.  Was placed on Singular and prednisone with the latter causing hyperglycemia requiring institution of insulin.  Now in range 70% compared with 10% of the time.  Wearing a continuous glucose monitor.    March 29, 2023:  Cycle 5 Keytruda    Review of Systems  Constitutional:  Positive for fatigue. Negative for appetite change, chills and fever.       Has been gaining weight due to increased eating  HENT:   Negative for nosebleeds and voice change.        Occasional sores on tongue and sore throat.  Occasional trouble swallowing.   Eyes:  Negative for icterus.       Vision worsened since diagnosed with DM.  Has not seen opthalmologist  Respiratory:  Negative for chest tightness, cough, hemoptysis, shortness of breath and wheezing.        PND:  none Orthopnea:  none DOE:    Cardiovascular:  Positive for palpitations. Negative for chest pain and leg swelling.       PND:  none Orthopnea:  none  Gastrointestinal:  Negative for abdominal pain, blood in stool, constipation, nausea and vomiting.       Some diarrhea due to metformen   Endocrine: Negative for hot flashes.       Heat intolerance  Genitourinary:  Negative for bladder incontinence, difficulty urinating, dysuria, frequency, hematuria and nocturia.   Musculoskeletal:   Negative for arthralgias, back pain, gait problem, myalgias, neck pain and neck stiffness.  Skin:  Negative for itching, rash and wound.  Neurological:  Negative for dizziness, extremity weakness, gait problem, light-headedness, numbness and speech difficulty.       Has some stress headaches  Hematological:  Negative for adenopathy. Bruises/bleeds easily.  Psychiatric/Behavioral:  Positive for depression. Negative for suicidal ideas. The patient is not nervous/anxious.     MEDICAL HISTORY: Past Medical History:  Diagnosis Date   Cancer (HCC)    Depression    Diabetes mellitus without complication (HCC)    Hypertension    Melanoma (HCC)    Sleep apnea     SURGICAL HISTORY: Past Surgical History:  Procedure Laterality Date   CHOLECYSTECTOMY     MELANOMA EXCISION WITH SENTINEL LYMPH  NODE BIOPSY      SOCIAL HISTORY: Social History   Socioeconomic History   Marital status: Divorced    Spouse name: Not on file   Number of children: 2   Years of education: 12   Highest education level: 12th grade  Occupational History    Comment: Print production planner  Tobacco Use   Smoking status: Former    Packs/day: 0.50    Years: 30.00    Additional pack years: 0.00    Total pack years: 15.00    Types: Cigarettes    Quit date: 11/23/2022    Years since quitting: 0.3   Smokeless tobacco: Not on file  Vaping Use   Vaping Use: Some days   Substances: CBD   Devices: CBD vape occasionally  Substance and Sexual Activity   Alcohol use: Never   Drug use: No    Types: Marijuana    Comment: former marijuana smoker   Sexual activity: Not Currently  Other Topics Concern   Not on file  Social History Narrative   Not on file   Social Determinants of Health   Financial Resource Strain: Medium Risk (12/28/2022)   Overall Financial Resource Strain (CARDIA)    Difficulty of Paying Living Expenses: Somewhat hard  Food Insecurity: No Food Insecurity (12/12/2022)   Hunger Vital Sign    Worried  About Running Out of Food in the Last Year: Never true    Ran Out of Food in the Last Year: Never true  Transportation Needs: No Transportation Needs (12/12/2022)   PRAPARE - Administrator, Civil Service (Medical): No    Lack of Transportation (Non-Medical): No  Physical Activity: Inactive (12/28/2022)   Exercise Vital Sign    Days of Exercise per Week: 0 days    Minutes of Exercise per Session: 0 min  Stress: Stress Concern Present (12/28/2022)   [employer] of Occupational Health - Occupational Stress Questionnaire    Feeling of Stress : Rather much  Social Connections: Moderately Isolated (12/28/2022)   Social Connection and Isolation Panel [NHANES]    Frequency of Communication with Friends and Family: More than three times a week    Frequency of Social Gatherings with Friends and Family: More than three times a week    Attends Religious Services: 1 to 4 times per year    Active Member of [employer] or Organizations: No    Attends Banker Meetings: Never    Marital Status: Divorced  Catering manager Violence: Not At Risk (12/12/2022)   Humiliation, Afraid, Rape, and Kick questionnaire    Fear of Current or Ex-Partner: No    Emotionally Abused: No    Physically Abused: No    Sexually Abused: No    FAMILY HISTORY No family history on file.  ALLERGIES:  has No Known Allergies.  MEDICATIONS:  Current Outpatient Medications  Medication Sig Dispense Refill   albuterol (VENTOLIN HFA) 108 (90 Base) MCG/ACT inhaler Inhale 2 puffs into the lungs every 6 (six) hours as needed for wheezing or shortness of breath. 8 g 2   FLUoxetine (PROZAC) 20 MG tablet Take 20 mg by mouth daily.     hydrochlorothiazide (HYDRODIURIL) 25 MG tablet Take by mouth.     hydrOXYzine (ATARAX) 25 MG tablet Take 25 mg by mouth 2 (two) times daily.     lisinopril (ZESTRIL) 20 MG tablet Take 1 tablet by mouth daily.     metFORMIN (GLUCOPHAGE) 1000 MG tablet Take 1 tablet (1,000 mg total)  by  mouth 2 (two) times daily with a meal. 60 tablet 1   metoprolol succinate (TOPROL-XL) 25 MG 24 hr tablet Take by mouth.     ondansetron (ZOFRAN) 8 MG tablet Take 1 tablet (8 mg total) by mouth every 8 (eight) hours as needed for nausea or vomiting. 30 tablet 1   potassium chloride (MICRO-K) 10 MEQ CR capsule Take 10 mEq by mouth 2 (two) times daily.     prochlorperazine (COMPAZINE) 10 MG tablet Take 1 tablet (10 mg total) by mouth every 6 (six) hours as needed for nausea or vomiting. 30 tablet 1   TRULICITY 0.75 MG/0.5ML SOPN      Varenicline Tartrate, Starter, 0.5 MG X 11 & 1 MG X 42 TBPK Take by mouth.     No current facility-administered medications for this visit.    PHYSICAL EXAMINATION:  ECOG PERFORMANCE STATUS: 0 - Asymptomatic   There were no vitals filed for this visit.   There were no vitals filed for this visit.    Physical Exam Vitals and nursing note reviewed.  Constitutional:      Appearance: Normal appearance. She is obese. She is not toxic-appearing or diaphoretic.     Comments: Here alone.  Tired appearing  HENT:     Head: Normocephalic and atraumatic.     Right Ear: External ear normal.     Left Ear: External ear normal.     Nose: Nose normal. No congestion or rhinorrhea.  Eyes:     General: No scleral icterus.    Extraocular Movements: Extraocular movements intact.     Conjunctiva/sclera: Conjunctivae normal.     Pupils: Pupils are equal, round, and reactive to light.  Cardiovascular:     Rate and Rhythm: Normal rate.     Heart sounds: No murmur heard.    No friction rub. No gallop.  Abdominal:     General: Bowel sounds are normal.     Palpations: Abdomen is soft.  Musculoskeletal:        General: No swelling, tenderness or deformity.     Cervical back: Normal range of motion and neck supple. No rigidity or tenderness.  Lymphadenopathy:     Head:     Right side of head: No submental, submandibular, tonsillar, preauricular, posterior auricular or  occipital adenopathy.     Left side of head: No submental, submandibular, tonsillar, preauricular, posterior auricular or occipital adenopathy.     Cervical: No cervical adenopathy.     Right cervical: No superficial, deep or posterior cervical adenopathy.    Left cervical: No superficial, deep or posterior cervical adenopathy.     Upper Body:     Right upper body: No supraclavicular, axillary, pectoral or epitrochlear adenopathy.     Left upper body: No supraclavicular, axillary, pectoral or epitrochlear adenopathy.  Skin:    General: Skin is warm and dry.     Coloration: Skin is not jaundiced.     Comments: Flushing of cheeks  Neurological:     General: No focal deficit present.     Mental Status: She is alert and oriented to person, place, and time. Mental status is at baseline.     Cranial Nerves: No cranial nerve deficit.  Psychiatric:        Mood and Affect: Mood normal.        Behavior: Behavior normal.        Thought Content: Thought content normal.        Judgment: Judgment normal.     Comments: Anxious  LABORATORY DATA: I have personally reviewed the data as listed:  Appointment on 03/03/2023  Component Date Value Ref Range Status   TSH 03/03/2023 0.777  0.350 - 4.500 uIU/mL Final   Comment: Performed by a 3rd Generation assay with a functional sensitivity of <=0.01 uIU/mL. Performed at [facility], [address], [place], [place] [address]    WBC Count 03/03/2023 10.9 (H)  4.0 - 10.5 K/uL Final   RBC 03/03/2023 4.78  3.87 - 5.11 MIL/uL Final   Hemoglobin 03/03/2023 13.7  12.0 - 15.0 g/dL Final   HCT 60/45/4098 39.7  36.0 - 46.0 % Final   MCV 03/03/2023 83.1  80.0 - 100.0 fL Final   MCH 03/03/2023 28.7  26.0 - 34.0 pg Final   MCHC 03/03/2023 34.5  30.0 - 36.0 g/dL Final   RDW 11/91/4782 12.6  11.5 - 15.5 % Final   Platelet Count 03/03/2023 250  150 - 400 K/uL Final   nRBC 03/03/2023 0.0  0.0 - 0.2 % Final   Neutrophils Relative % 03/03/2023  70  % Final   Neutro Abs 03/03/2023 7.7  1.7 - 7.7 K/uL Final   Lymphocytes Relative 03/03/2023 19  % Final   Lymphs Abs 03/03/2023 2.0  0.7 - 4.0 K/uL Final   Monocytes Relative 03/03/2023 6  % Final   Monocytes Absolute 03/03/2023 0.7  0.1 - 1.0 K/uL Final   Eosinophils Relative 03/03/2023 3  % Final   Eosinophils Absolute 03/03/2023 0.3  0.0 - 0.5 K/uL Final   Basophils Relative 03/03/2023 1  % Final   Basophils Absolute 03/03/2023 0.1  0.0 - 0.1 K/uL Final   Immature Granulocytes 03/03/2023 1  % Final   Abs Immature Granulocytes 03/03/2023 0.09 (H)  0.00 - 0.07 K/uL Final   Performed at [facility], [address], [place], [place] [address]   Sodium 03/03/2023 133 (L)  135 - 145 mmol/L Final   Potassium 03/03/2023 3.5  3.5 - 5.1 mmol/L Final   Chloride 03/03/2023 101  98 - 111 mmol/L Final   CO2 03/03/2023 23  22 - 32 mmol/L Final   Glucose, Bld 03/03/2023 241 (H)  70 - 99 mg/dL Final   Glucose reference range applies only to samples taken after fasting for at least 8 hours.   BUN 03/03/2023 13  6 - 20 mg/dL Final   Creatinine 30/86/5784 0.81  0.44 - 1.00 mg/dL Final   Calcium 69/62/9528 8.6 (L)  8.9 - 10.3 mg/dL Final   Total Protein 41/32/4401 6.7  6.5 - 8.1 g/dL Final   Albumin 02/72/5366 3.3 (L)  3.5 - 5.0 g/dL Final   AST 44/11/4740 19  15 - 41 U/L Final   ALT 03/03/2023 22  0 - 44 U/L Final   Alkaline Phosphatase 03/03/2023 76  38 - 126 U/L Final   Total Bilirubin 03/03/2023 0.7  0.3 - 1.2 mg/dL Final   GFR, Estimated 03/03/2023 >60  >60 mL/min Final   Comment: (NOTE) Calculated using the CKD-EPI Creatinine Equation (2021)    Anion gap 03/03/2023 9  5 - 15 Final   Performed at [facility], [address], [place], [place] [address]   hCG, Conley Rolls, Quant, Kathie Rhodes 03/03/2023 <1  <5 mIU/mL Final   Comment:          GEST. AGE      CONC.  (mIU/mL)   <=1 WEEK        5 - 50  2 WEEKS       50 - 500     3 WEEKS       100 - 10,000     4  WEEKS     1,000 - 30,000     5 WEEKS     3,500 - 115,000   6-8 WEEKS     12,000 - 270,000    12 WEEKS     15,000 - 220,000        FEMALE AND NON-PREGNANT FEMALE:     LESS THAN 5 mIU/mL Performed at [facility], [address], [place], [place] [address]     RADIOGRAPHIC STUDIES: I have personally reviewed the radiological images as listed and agree with the findings in the report  No results found.  ASSESSMENT/PLAN 47 y.o. female is here because of  malignant melanoma.  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  Biopsied November 23 2022- Wide local excision and sentinel LN bx.  (Patient did not have clinically apparent LN's by history, nor in transit disease) December 12 2022- Obtain BRAF mutation studies on surgical material December 16 2022 -PET/CT whole body.  No residual melanoma or metastatic disease.  Two small pulmonary nodules thought unrelated. MRI brain negative  Therapeutics   December 12 2022- Discussed use of adjuvant therapy to prevent recurrence.   December 20 2022- Per NCCN guidelines recommended adjuvant Pembrolizumab  January 03 2023- Cycle 1 Keytruda  Jan 23 2023- Slight skin rash which is being managed with low potency topical steroids  Jan 25 2023- Cycle 2 Keytruda  Feb 06 2023- Labs to evaluate for possible autoimmune symptoms from [facility] notable only for mild elevation in CRP.    Feb 14 2023- Reviewed results of labs with patient.  Suspect that some of sx are due to inadequately controlled DM Type II and recent bout of asthma/bronchitis  Feb 15 2023- Cycle 3 Keytruda  March 08 2023- Cycle 4 Keytruda  March 29, 2023:  Cycle 5 Keytruda (Scheduled.  Labs OK to treat)  Vivid dreams  Feb 06 2023- Began after starting Chantix for smoking cessation.   This is a known side effect.    Poor venous access:     December 22 2022- Portacath placed  Post operative seroma-  December 20 2022- Mild and should resolve with conservative management.  A common problem with inguinal lymphadenectomy.  Other risk factors are obesity, DM Type II and tobacco use  Jan 23 2023- Resolved with conservative management    Cancer Staging  Malignant melanoma of right lower leg [facility]) Staging form: Melanoma of the Skin, AJCC 8th Edition - Clinical stage from 12/12/2022: Stage III (cT4b, cN1a, cM0) - Signed by [name], MD on 12/12/2022 Histopathologic type: Nodular melanoma Stage prefix: Initial diagnosis    No problem-specific Assessment & Plan notes found for this encounter.    No orders of the defined types were placed in this encounter.   30  minutes was spent in patient care.  This included time spent preparing to see the patient (e.g., review of tests), obtaining and/or reviewing separately obtained history, counseling and educating the patient/family/caregiver, ordering tests, or procedures; documenting clinical information in the electronic or other health record, independently interpreting results and communicating results to the patient/family/caregiver as well as coordination  of care.       All questions were answered. The patient knows to call the clinic with any problems, questions or concerns.  This note was electronically signed.    [name], MD  03/27/2023 12:23 PM

## 2023-03-28 MED FILL — Pembrolizumab IV Soln 100 MG/4ML (25 MG/ML): INTRAVENOUS | Qty: 8 | Status: AC

## 2023-03-29 ENCOUNTER — Inpatient Hospital Stay: Payer: 59

## 2023-03-29 VITALS — BP 123/87 | HR 98 | Temp 98.9°F | Resp 18 | Ht 65.0 in | Wt 289.1 lb

## 2023-03-29 DIAGNOSIS — Z79899 Other long term (current) drug therapy: Secondary | ICD-10-CM | POA: Diagnosis not present

## 2023-03-29 DIAGNOSIS — C4371 Malignant melanoma of right lower limb, including hip: Secondary | ICD-10-CM

## 2023-03-29 DIAGNOSIS — Z5112 Encounter for antineoplastic immunotherapy: Secondary | ICD-10-CM | POA: Diagnosis not present

## 2023-03-29 MED ORDER — SODIUM CHLORIDE 0.9 % IV SOLN: Freq: Once | INTRAVENOUS | Status: AC

## 2023-03-29 MED ORDER — LIDOCAINE-PRILOCAINE 2.5-2.5 % EX CREA
TOPICAL_CREAM | CUTANEOUS | 3 refills | Status: DC
Start: 2023-03-29 — End: 2023-12-08

## 2023-03-29 MED ORDER — SODIUM CHLORIDE 0.9% FLUSH
10.0000 mL | INTRAVENOUS | Status: DC | PRN
  Administered 2023-03-29: 10 mL

## 2023-03-29 MED ORDER — SODIUM CHLORIDE 0.9 % IV SOLN
200.0000 mg | Freq: Once | INTRAVENOUS | Status: AC
  Administered 2023-03-29: 200 mg via INTRAVENOUS
  Filled 2023-03-29: qty 8

## 2023-03-29 MED ORDER — HEPARIN SOD (PORK) LOCK FLUSH 100 UNIT/ML IV SOLN
500.0000 [IU] | Freq: Once | INTRAVENOUS | Status: AC | PRN
  Administered 2023-03-29: 500 [IU]

## 2023-03-29 NOTE — Patient Instructions (Signed)

## 2023-04-06 ENCOUNTER — Encounter: Payer: Self-pay | Admitting: Oncology

## 2023-04-12 DIAGNOSIS — I10 Essential (primary) hypertension: Secondary | ICD-10-CM | POA: Diagnosis not present

## 2023-04-17 ENCOUNTER — Encounter: Payer: Self-pay | Admitting: Oncology

## 2023-04-17 ENCOUNTER — Inpatient Hospital Stay (HOSPITAL_BASED_OUTPATIENT_CLINIC_OR_DEPARTMENT_OTHER): Payer: 59 | Admitting: Oncology

## 2023-04-17 ENCOUNTER — Inpatient Hospital Stay: Payer: 59

## 2023-04-17 VITALS — BP 145/85 | HR 86 | Temp 98.4°F | Resp 16 | Ht 65.0 in | Wt 297.8 lb

## 2023-04-17 DIAGNOSIS — Z5112 Encounter for antineoplastic immunotherapy: Secondary | ICD-10-CM

## 2023-04-17 DIAGNOSIS — L989 Disorder of the skin and subcutaneous tissue, unspecified: Secondary | ICD-10-CM | POA: Insufficient documentation

## 2023-04-17 DIAGNOSIS — C4371 Malignant melanoma of right lower limb, including hip: Secondary | ICD-10-CM | POA: Diagnosis not present

## 2023-04-17 DIAGNOSIS — Z79899 Other long term (current) drug therapy: Secondary | ICD-10-CM | POA: Diagnosis not present

## 2023-04-17 LAB — CMP (CANCER CENTER ONLY)
ALT: 23 U/L (ref 0–44)
AST: 20 U/L (ref 15–41)
Albumin: 3.5 g/dL (ref 3.5–5.0)
Alkaline Phosphatase: 89 U/L (ref 38–126)
Anion gap: 9 (ref 5–15)
BUN: 10 mg/dL (ref 6–20)
CO2: 26 mmol/L (ref 22–32)
Calcium: 9.3 mg/dL (ref 8.9–10.3)
Chloride: 103 mmol/L (ref 98–111)
Creatinine: 0.78 mg/dL (ref 0.44–1.00)
GFR, Estimated: >60 mL/min (ref >60)
Glucose, Bld: 107 mg/dL — ABNORMAL HIGH (ref 70–99)
Potassium: 3.7 mmol/L (ref 3.5–5.1)
Sodium: 138 mmol/L (ref 135–145)
Total Bilirubin: 0.4 mg/dL (ref 0.3–1.2)
Total Protein: 6.8 g/dL (ref 6.5–8.1)

## 2023-04-17 LAB — CBC WITH DIFFERENTIAL (CANCER CENTER ONLY)
Abs Immature Granulocytes: 0.03 10*3/uL (ref 0.00–0.07)
Basophils Absolute: 0.1 10*3/uL (ref 0.0–0.1)
Basophils Relative: 1 %
Eosinophils Absolute: 0.3 10*3/uL (ref 0.0–0.5)
Eosinophils Relative: 3 %
HCT: 39.1 % (ref 36.0–46.0)
Hemoglobin: 13.1 g/dL (ref 12.0–15.0)
Immature Granulocytes: 0 %
Lymphocytes Relative: 22 %
Lymphs Abs: 2.2 10*3/uL (ref 0.7–4.0)
MCH: 28.4 pg (ref 26.0–34.0)
MCHC: 33.5 g/dL (ref 30.0–36.0)
MCV: 84.6 fL (ref 80.0–100.0)
Monocytes Absolute: 0.8 10*3/uL (ref 0.1–1.0)
Monocytes Relative: 8 %
Neutro Abs: 6.6 10*3/uL (ref 1.7–7.7)
Neutrophils Relative %: 66 %
Platelet Count: 269 10*3/uL (ref 150–400)
RBC: 4.62 MIL/uL (ref 3.87–5.11)
RDW: 13.2 % (ref 11.5–15.5)
WBC Count: 9.9 10*3/uL (ref 4.0–10.5)
nRBC: 0 % (ref 0.0–0.2)

## 2023-04-17 LAB — TSH: TSH: 0.023 u[IU]/mL — ABNORMAL LOW (ref 0.350–4.500)

## 2023-04-17 NOTE — Progress Notes (Signed)
[facility] Cancer Initial Visit:  Patient Care Team: [name], [name], MD as PCP - General (Family Medicine)  CHIEF COMPLAINTS/PURPOSE OF CONSULTATION:  Oncology History  Malignant melanoma of right lower leg (HCC)  11/15/2022 Initial Diagnosis   Malignant melanoma of right lower leg (HCC)   12/12/2022 Cancer Staging   Staging form: Melanoma of the Skin, AJCC 8th Edition - Clinical stage from 12/12/2022: Stage III (cT4b, cN1a, cM0) - Signed by [name], MD on 12/12/2022 Histopathologic type: Nodular melanoma Stage prefix: Initial diagnosis   01/03/2023 -  Chemotherapy   Patient is on Treatment Plan : MELANOMA Pembrolizumab (200) q21d       HISTORY OF PRESENTING ILLNESS: Diana Odom 47 y.o. female is here because of  malignant melanoma Medical history notable for tobacco use, diabetes mellitus type 2, hypertension, OSA on CPAP  October 25, 2022: Presented to dermatology with a red, raised, nodular lesion on right anterior shin which have been present for over a year and a half.  It began as a dark mole and had grown slowly over the period of time.  Patient underwent excisional biopsy followed by electrodesiccation and curettage Pathology demonstrated malignant melanoma at least 5 mm thick.  Deep margin was involved ulceration present mitotic index 5/mm.  Tumor infiltrating lymphocyte not brisk  Patient was subsequently referred to surgery for additional management  November 23, 2022: Wide local excision of malignant melanoma with resulting 6 cm diameter circular wound; right inguinal sentinel lymph node biopsy Pathology from the excisional biopsy site demonstrated residual melanoma in situ, no residual invasive disease, LVI invasion seen, margins free of malignancy.  No change in stage from T4b.  Sentinel lymph node positive for melanoma (3 separate aggregate blocks) no extracapsular extension Pathologic stage IIIC (T4b, N1 M0)  December 12 2022:  [facility] Consult  States that her FSGB's run from 185 to 215  Feels down and depressed.  Eating a lot due to her emotions.    Social:  Divorced.  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   Quit smoking since her surgery in March 2024.  Formerly smoked 1 ppd.  EtOH drinks once every few weeks.    [facility] Mother died 39 CHF Father died late 56's CHF and COPD Sister alive 75 well Brother alive 32 CAD, DM Type II, COPD   December 16 2022:  PET/CT (Melanoma protocol) No evidence of residual melanoma or metastatic melanoma on whole-body scan.  Postsurgical seroma in the RIGHT inguinal region without  hypermetabolic lymph nodes in the RIGHT groin. Mild metabolic activity associated wide local excisions surgical site RIGHT lower extremity. Two small pulmonary nodules are favored un-related to melanoma.   MRI brain No evidence of intracranial metastatic disease.   December 20 2022:   Reviewed results of imaging and labs with patient and sister.  Discussed role of adjuvant immunotherapy.  Discussed risks and benefits of this.    January 03 2023:  Cycle 1 Keytruda  January 05 2023:  Has tolerated it well.  No pruritus, rash, nausea or emesis.  Provided reassuance  January 11 2023:  Port placed.    Jan 23, 2023:   Has lost 5 lbs.  Recovering from a bout of diarrhea from the weekend of March 26th; helped by imodium.  Appetite better but overall diminished.  Slight erythematous rash on wrists, resolving.  Slight nausea, no emesis.  Sentinel LN bx site healing and no longer draining.   CMP notable for  sodium 131  Glucose 260  Jan 25, 2023;  Cycle 2 Keytruda  Feb 06 2023:  Scheduled follow-up for melanoma.  Has lost 5 lbs.   States that she did not feel right after receiving last dose of Keytruda; light headed, fatigued, stomach upset (nausea/diarrhea).  Was fine the next day and did well until 3 days ago when developed GI symptoms, SOB and generalized myalgias.  Not smoking.  Using CPAP.  Has a wheeze.  No  pleuritic chest pain. Started Chantix about a month ago.  Having vivid dreams.   Glucose 215.  CK 40.  CRP 2.3 sed rate 17.  Plasma cortisol 4.6.  TSH 0.879.  Free T41.19.  ANA panel negative.  Rheumatoid factor negative  Feb 14, 2023:  Reviewed results of labs with patient.  Feels OK.  Has gained 2 lbs.  Last week was placed on Prednisone and Z pack for bronchitis. Not wheezing as badly since she was treated.  She has seasonal allergies.  Remains a non-smoker.    Feb 15 2923:  Cycle 3 Keytruda   March 08, 2023:  Cycle 4 Keytruda   March 27, 2023:    Has lost 7 lbs.  Had chest heaviness and wheezing with last cycle.  Was placed on Singular and prednisone with the latter causing hyperglycemia requiring institution of insulin.  Now in range 70% compared with 10% of the time.  Wearing a continuous glucose monitor.    March 29, 2023:  Cycle 5 Keytruda   April 17 2023:  Scheduled follow-up for management of malignant melanoma.   Feeling well.  Gained 5 lbs.  FSBG's averaging 160 which is better than in the past.    April 19 2023:  Cycle 6 Keytruda  April 21 2023:  Colonoscopy  April 26 2023: Excision biopsy of skin lesion on back at [facility]  Review of Systems  Constitutional:  Positive for fatigue. Negative for appetite change, chills and fever.       Has been gaining weight due to increased eating  HENT:   Negative for nosebleeds and voice change.        Occasional sores on tongue and sore throat.  Occasional trouble swallowing.   Eyes:  Negative for icterus.       Vision worsened since diagnosed with DM.  Has not seen opthalmologist  Respiratory:  Negative for chest tightness, cough, hemoptysis, shortness of breath and wheezing.        PND:  none Orthopnea:  none DOE:    Cardiovascular:  Positive for palpitations. Negative for chest pain and leg swelling.       PND:  none Orthopnea:  none  Gastrointestinal:  Negative for abdominal pain, blood in stool, constipation, nausea and  vomiting.       Some diarrhea due to metformen   Endocrine: Negative for hot flashes.       Heat intolerance  Genitourinary:  Negative for bladder incontinence, difficulty urinating, dysuria, frequency, hematuria and nocturia.   Musculoskeletal:  Negative for arthralgias, back pain, gait problem, myalgias, neck pain and neck stiffness.  Skin:  Negative for itching, rash and wound.  Neurological:  Negative for dizziness, extremity weakness, gait problem, light-headedness, numbness and speech difficulty.       Has some stress headaches  Hematological:  Negative for adenopathy. Bruises/bleeds easily.  Psychiatric/Behavioral:  Positive for depression. Negative for suicidal ideas. The patient is not nervous/anxious.     MEDICAL HISTORY: Past Medical History:  Diagnosis Date  Cancer (HCC)    Depression    Diabetes mellitus without complication (HCC)    Hypertension    Melanoma (HCC)    Sleep apnea     SURGICAL HISTORY: Past Surgical History:  Procedure Laterality Date   CHOLECYSTECTOMY     MELANOMA EXCISION WITH SENTINEL LYMPH NODE BIOPSY      SOCIAL HISTORY: Social History   Socioeconomic History   Marital status: Divorced    Spouse name: Not on file   Number of children: 2   Years of education: 12   Highest education level: 12th grade  Occupational History    Comment: Print production planner  Tobacco Use   Smoking status: Former    Current packs/day: 0.00    Average packs/day: 0.5 packs/day for 30.0 years (15.0 ttl pk-yrs)    Types: Cigarettes    Start date: 11/22/1992    Quit date: 11/23/2022    Years since quitting: 0.3   Smokeless tobacco: Not on file  Vaping Use   Vaping status: Some Days   Substances: CBD   Devices: CBD vape occasionally  Substance and Sexual Activity   Alcohol use: Never   Drug use: No    Types: Marijuana    Comment: former marijuana smoker   Sexual activity: Not Currently  Other Topics Concern   Not on file  Social History Narrative   Not on  file   Social Determinants of Health   Financial Resource Strain: Medium Risk (12/28/2022)   Overall Financial Resource Strain (CARDIA)    Difficulty of Paying Living Expenses: Somewhat hard  Food Insecurity: Low Risk  (03/06/2023)   Received from [facility]   Food vital sign    Within the past 12 months, you worried that your food would run out before you got money to buy more: Never true    Within the past 12 months, the food you bought just didn't last and you didn't have money to get more. : Never true  Transportation Needs: Not on file (03/06/2023)  Physical Activity: Inactive (12/28/2022)   Exercise Vital Sign    Days of Exercise per Week: 0 days    Minutes of Exercise per Session: 0 min  Stress: Stress Concern Present (12/28/2022)   [employer] of Occupational Health - Occupational Stress Questionnaire    Feeling of Stress : Rather much  Social Connections: Moderately Isolated (12/28/2022)   Social Connection and Isolation Panel [NHANES]    Frequency of Communication with Friends and Family: More than three times a week    Frequency of Social Gatherings with Friends and Family: More than three times a week    Attends Religious Services: 1 to 4 times per year    Active Member of [employer] or Organizations: No    Attends Banker Meetings: Never    Marital Status: Divorced  Catering manager Violence: Not At Risk (12/12/2022)   Humiliation, Afraid, Rape, and Kick questionnaire    Fear of Current or Ex-Partner: No    Emotionally Abused: No    Physically Abused: No    Sexually Abused: No    FAMILY HISTORY No family history on file.  ALLERGIES:  has No Known Allergies.  MEDICATIONS:  Current Outpatient Medications  Medication Sig Dispense Refill   albuterol (VENTOLIN HFA) 108 (90 Base) MCG/ACT inhaler Inhale 2 puffs into the lungs every 6 (six) hours as needed for wheezing or shortness of breath. 8 g 2   FLUoxetine (PROZAC) 20 MG tablet Take 20 mg by mouth  daily.     hydrochlorothiazide (HYDRODIURIL) 25 MG tablet Take by mouth.     hydrOXYzine (ATARAX) 25 MG tablet Take 25 mg by mouth 2 (two) times daily.     lidocaine-prilocaine (EMLA) cream Apply to affected area once 30 g 3   lisinopril (ZESTRIL) 20 MG tablet Take 1 tablet by mouth daily.     metFORMIN (GLUCOPHAGE) 1000 MG tablet Take 1 tablet (1,000 mg total) by mouth 2 (two) times daily with a meal. 60 tablet 1   metoprolol succinate (TOPROL-XL) 25 MG 24 hr tablet Take by mouth.     montelukast (SINGULAIR) 10 MG tablet Take 10 mg by mouth daily.     NOVOLOG FLEXPEN 100 UNIT/ML FlexPen Inject into the skin as directed. Sliding scale     ondansetron (ZOFRAN) 8 MG tablet Take 1 tablet (8 mg total) by mouth every 8 (eight) hours as needed for nausea or vomiting. 30 tablet 1   potassium chloride (MICRO-K) 10 MEQ CR capsule Take 10 mEq by mouth 2 (two) times daily.     prochlorperazine (COMPAZINE) 10 MG tablet Take 1 tablet (10 mg total) by mouth every 6 (six) hours as needed for nausea or vomiting. 30 tablet 1   TRESIBA FLEXTOUCH 100 UNIT/ML FlexTouch Pen Inject 10 Units into the skin at bedtime.     TRULICITY 1.5 MG/0.5ML SOPN Inject 1.5 mg into the skin once a week.     varenicline (CHANTIX) 1 MG tablet Take 1 mg by mouth 2 (two) times daily.     No current facility-administered medications for this visit.    PHYSICAL EXAMINATION:  ECOG PERFORMANCE STATUS: 0 - Asymptomatic   Vitals:   04/17/23 1434  BP: (!) 145/85  Pulse: 86  Resp: 16  Temp: 98.4 F (36.9 C)  SpO2: 98%    Filed Weights   04/17/23 1434  Weight: 297 lb 12.8 oz (135.1 kg)     Physical Exam Vitals and nursing note reviewed.  Constitutional:      Appearance: Normal appearance. She is obese. She is not toxic-appearing or diaphoretic.     Comments: Here alone.    HENT:     Head: Normocephalic and atraumatic.     Right Ear: External ear normal.     Left Ear: External ear normal.     Nose: Nose normal. No  congestion or rhinorrhea.  Eyes:     General: No scleral icterus.    Extraocular Movements: Extraocular movements intact.     Conjunctiva/sclera: Conjunctivae normal.     Pupils: Pupils are equal, round, and reactive to light.  Cardiovascular:     Rate and Rhythm: Normal rate.     Heart sounds: No murmur heard.    No friction rub. No gallop.  Abdominal:     General: Bowel sounds are normal.     Palpations: Abdomen is soft.  Musculoskeletal:        General: No swelling, tenderness or deformity.     Cervical back: Normal range of motion and neck supple. No rigidity or tenderness.  Lymphadenopathy:     Head:     Right side of head: No submental, submandibular, tonsillar, preauricular, posterior auricular or occipital adenopathy.     Left side of head: No submental, submandibular, tonsillar, preauricular, posterior auricular or occipital adenopathy.     Cervical: No cervical adenopathy.     Right cervical: No superficial, deep or posterior cervical adenopathy.    Left cervical: No superficial, deep or posterior cervical adenopathy.  Upper Body:     Right upper body: No supraclavicular, axillary, pectoral or epitrochlear adenopathy.     Left upper body: No supraclavicular, axillary, pectoral or epitrochlear adenopathy.  Skin:    General: Skin is warm and dry.     Coloration: Skin is not jaundiced.     Comments: Flushing of cheeks  Neurological:     General: No focal deficit present.     Mental Status: She is alert and oriented to person, place, and time. Mental status is at baseline.     Cranial Nerves: No cranial nerve deficit.  Psychiatric:        Mood and Affect: Mood normal.        Behavior: Behavior normal.        Thought Content: Thought content normal.        Judgment: Judgment normal.     Comments: Anxious     LABORATORY DATA: I have personally reviewed the data as listed:  Appointment on 03/27/2023  Component Date Value Ref Range Status   WBC Count 03/27/2023  9.6  4.0 - 10.5 K/uL Final   RBC 03/27/2023 4.75  3.87 - 5.11 MIL/uL Final   Hemoglobin 03/27/2023 13.4  12.0 - 15.0 g/dL Final   HCT 78/29/5621 40.4  36.0 - 46.0 % Final   MCV 03/27/2023 85.1  80.0 - 100.0 fL Final   MCH 03/27/2023 28.2  26.0 - 34.0 pg Final   MCHC 03/27/2023 33.2  30.0 - 36.0 g/dL Final   RDW 30/86/5784 12.8  11.5 - 15.5 % Final   Platelet Count 03/27/2023 291  150 - 400 K/uL Final   nRBC 03/27/2023 0.0  0.0 - 0.2 % Final   Neutrophils Relative % 03/27/2023 65  % Final   Neutro Abs 03/27/2023 6.2  1.7 - 7.7 K/uL Final   Lymphocytes Relative 03/27/2023 23  % Final   Lymphs Abs 03/27/2023 2.2  0.7 - 4.0 K/uL Final   Monocytes Relative 03/27/2023 9  % Final   Monocytes Absolute 03/27/2023 0.9  0.1 - 1.0 K/uL Final   Eosinophils Relative 03/27/2023 2  % Final   Eosinophils Absolute 03/27/2023 0.2  0.0 - 0.5 K/uL Final   Basophils Relative 03/27/2023 1  % Final   Basophils Absolute 03/27/2023 0.1  0.0 - 0.1 K/uL Final   Immature Granulocytes 03/27/2023 0  % Final   Abs Immature Granulocytes 03/27/2023 0.03  0.00 - 0.07 K/uL Final   Performed at [facility], [address], [place], [place] [address]   Sodium 03/27/2023 135  135 - 145 mmol/L Final   Potassium 03/27/2023 3.6  3.5 - 5.1 mmol/L Final   Chloride 03/27/2023 101  98 - 111 mmol/L Final   CO2 03/27/2023 25  22 - 32 mmol/L Final   Glucose, Bld 03/27/2023 122 (H)  70 - 99 mg/dL Final   Glucose reference range applies only to samples taken after fasting for at least 8 hours.   BUN 03/27/2023 11  6 - 20 mg/dL Final   Creatinine 52/84/1324 0.79  0.44 - 1.00 mg/dL Final   Calcium 40/06/2724 9.2  8.9 - 10.3 mg/dL Final   Total Protein 36/64/4034 7.4  6.5 - 8.1 g/dL Final   Albumin 74/25/9563 3.8  3.5 - 5.0 g/dL Final   AST 87/56/4332 26  15 - 41 U/L Final   ALT 03/27/2023 33  0 - 44 U/L Final   Alkaline Phosphatase 03/27/2023 95  38 - 126 U/L Final   Total  Bilirubin 03/27/2023 0.5  0.3 - 1.2  mg/dL Final   GFR, Estimated 03/27/2023 >60  >60 mL/min Final   Comment: (NOTE) Calculated using the CKD-EPI Creatinine Equation (2021)    Anion gap 03/27/2023 9  5 - 15 Final   Performed at [facility], [address], [place], [place] [address]   TSH 03/27/2023 0.014 (L)  0.350 - 4.500 uIU/mL Final   Comment: Performed by a 3rd Generation assay with a functional sensitivity of <=0.01 uIU/mL. Performed at [facility], [address] [place], [place] [address]     RADIOGRAPHIC STUDIES: I have personally reviewed the radiological images as listed and agree with the findings in the report  No results found.  ASSESSMENT/PLAN 47 y.o. female is here because of  malignant melanoma.  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  Biopsied November 23 2022- Wide local excision and sentinel LN bx.  (Patient did not have clinically apparent LN's by history, nor in transit disease) December 12 2022- Obtain BRAF mutation studies on surgical material December 16 2022 -PET/CT whole body.  No residual melanoma or metastatic disease.  Two small pulmonary nodules thought unrelated. MRI brain negative  Therapeutics   December 12 2022- Discussed use of adjuvant therapy to prevent recurrence.   December 20 2022- Per NCCN guidelines recommended adjuvant Pembrolizumab  January 03 2023- Cycle 1 Keytruda  Jan 23 2023- Slight skin rash which is being managed with low potency topical steroids  Jan 25 2023- Cycle 2 Keytruda  Feb 06 2023- Labs to evaluate for possible autoimmune symptoms from [facility] notable only for mild elevation in CRP.    Feb 14 2023- Reviewed results of labs with patient.  Suspect that some of sx are due to inadequately controlled DM Type II and recent bout  of asthma/bronchitis  Feb 15 2023- Cycle 3 Keytruda  March 08 2023- Cycle 4 Keytruda  March 29, 2023:  Cycle 5 Keytruda   April 17 2023- Arrange for follow up whole body PET/CT  April 19 2023- Cycle 6 Keytruda    New skin lesions  April 26 2023- To undergo excision bx of skin lesion on back.  Will obtain path reports.    Vivid dreams  Feb 06 2023- Began after starting Chantix for smoking cessation.  This is a known side effect.    Poor venous access:     December 22 2022- Portacath placed  Post operative seroma-  December 20 2022- Mild and should resolve with conservative management.  A common problem with inguinal lymphadenectomy.  Other risk factors are obesity, DM Type II and tobacco use  Jan 23 2023- Resolved with conservative management    Cancer Staging  Malignant melanoma of right lower leg [facility]) Staging form: Melanoma of the Skin, AJCC 8th Edition - Clinical stage from 12/12/2022: Stage III (cT4b, cN1a, cM0) - Signed by [name], MD on 12/12/2022 Histopathologic type: Nodular melanoma Stage prefix: Initial diagnosis    No problem-specific Assessment & Plan notes found for this encounter.    No orders of the defined types were placed in this encounter.   30  minutes was spent in patient care.  This included time spent preparing to see the patient (e.g., review of tests), obtaining and/or reviewing separately obtained history, counseling and  educating the patient/family/caregiver, ordering tests, or procedures; documenting clinical information in the electronic or other health record, independently interpreting results and communicating results to the patient/family/caregiver as well as coordination of care.       All questions were answered. The patient knows to call the clinic with any problems, questions or concerns.  This note was electronically signed.    [name], MD  04/17/2023 2:44 PM

## 2023-04-18 MED FILL — Pembrolizumab IV Soln 100 MG/4ML (25 MG/ML): INTRAVENOUS | Qty: 8 | Status: AC

## 2023-04-19 ENCOUNTER — Inpatient Hospital Stay: Payer: 59

## 2023-04-19 VITALS — BP 146/81 | HR 81 | Temp 98.8°F | Resp 20 | Ht 65.0 in | Wt 296.0 lb

## 2023-04-19 DIAGNOSIS — Z79899 Other long term (current) drug therapy: Secondary | ICD-10-CM | POA: Diagnosis not present

## 2023-04-19 DIAGNOSIS — E1165 Type 2 diabetes mellitus with hyperglycemia: Secondary | ICD-10-CM | POA: Diagnosis not present

## 2023-04-19 DIAGNOSIS — Z794 Long term (current) use of insulin: Secondary | ICD-10-CM | POA: Diagnosis not present

## 2023-04-19 DIAGNOSIS — C4371 Malignant melanoma of right lower limb, including hip: Secondary | ICD-10-CM

## 2023-04-19 DIAGNOSIS — Z5112 Encounter for antineoplastic immunotherapy: Secondary | ICD-10-CM | POA: Diagnosis not present

## 2023-04-19 MED ORDER — SODIUM CHLORIDE 0.9 % IV SOLN: Freq: Once | INTRAVENOUS | Status: AC

## 2023-04-19 MED ORDER — HEPARIN SOD (PORK) LOCK FLUSH 100 UNIT/ML IV SOLN
500.0000 [IU] | Freq: Once | INTRAVENOUS | Status: AC | PRN
  Administered 2023-04-19: 500 [IU]

## 2023-04-19 MED ORDER — SODIUM CHLORIDE 0.9% FLUSH
10.0000 mL | INTRAVENOUS | Status: DC | PRN
  Administered 2023-04-19: 10 mL

## 2023-04-19 MED ORDER — SODIUM CHLORIDE 0.9 % IV SOLN
200.0000 mg | Freq: Once | INTRAVENOUS | Status: AC
  Administered 2023-04-19: 200 mg via INTRAVENOUS
  Filled 2023-04-19: qty 8

## 2023-04-19 NOTE — Patient Instructions (Signed)

## 2023-04-20 ENCOUNTER — Other Ambulatory Visit: Payer: Self-pay

## 2023-04-21 DIAGNOSIS — Z7984 Long term (current) use of oral hypoglycemic drugs: Secondary | ICD-10-CM | POA: Diagnosis not present

## 2023-04-21 DIAGNOSIS — Z1211 Encounter for screening for malignant neoplasm of colon: Secondary | ICD-10-CM | POA: Diagnosis not present

## 2023-04-21 DIAGNOSIS — E119 Type 2 diabetes mellitus without complications: Secondary | ICD-10-CM | POA: Diagnosis not present

## 2023-04-21 DIAGNOSIS — I10 Essential (primary) hypertension: Secondary | ICD-10-CM | POA: Diagnosis not present

## 2023-04-21 DIAGNOSIS — Z794 Long term (current) use of insulin: Secondary | ICD-10-CM | POA: Diagnosis not present

## 2023-04-21 DIAGNOSIS — K648 Other hemorrhoids: Secondary | ICD-10-CM | POA: Diagnosis not present

## 2023-04-25 ENCOUNTER — Other Ambulatory Visit: Payer: Self-pay

## 2023-04-25 DIAGNOSIS — R0609 Other forms of dyspnea: Secondary | ICD-10-CM

## 2023-04-26 ENCOUNTER — Ambulatory Visit (INDEPENDENT_AMBULATORY_CARE_PROVIDER_SITE_OTHER): Payer: 59 | Admitting: Pulmonary Disease

## 2023-04-26 DIAGNOSIS — D485 Neoplasm of uncertain behavior of skin: Secondary | ICD-10-CM | POA: Diagnosis not present

## 2023-04-26 DIAGNOSIS — R0609 Other forms of dyspnea: Secondary | ICD-10-CM | POA: Diagnosis not present

## 2023-04-26 DIAGNOSIS — C4371 Malignant melanoma of right lower limb, including hip: Secondary | ICD-10-CM | POA: Diagnosis not present

## 2023-04-26 DIAGNOSIS — Z6841 Body Mass Index (BMI) 40.0 and over, adult: Secondary | ICD-10-CM | POA: Diagnosis not present

## 2023-04-26 DIAGNOSIS — E878 Other disorders of electrolyte and fluid balance, not elsewhere classified: Secondary | ICD-10-CM | POA: Diagnosis not present

## 2023-04-26 DIAGNOSIS — Z79899 Other long term (current) drug therapy: Secondary | ICD-10-CM | POA: Diagnosis not present

## 2023-04-26 LAB — PULMONARY FUNCTION TEST
DL/VA % pred: 116 %
DL/VA: 5.04 ml/min/mmHg/L
DLCO cor % pred: 117 %
DLCO cor: 25.85 ml/min/mmHg
DLCO unc % pred: 116 %
DLCO unc: 25.61 ml/min/mmHg
FEF 25-75 Post: 1.39 L/sec
FEF 25-75 Pre: 0.94 L/sec
FEF2575-%Change-Post: 47 %
FEF2575-%Pred-Post: 47 %
FEF2575-%Pred-Pre: 32 %
FEV1-%Change-Post: 13 %
FEV1-%Pred-Post: 69 %
FEV1-%Pred-Pre: 61 %
FEV1-Post: 2.05 L
FEV1-Pre: 1.81 L
FEV1FVC-%Change-Post: 5 %
FEV1FVC-%Pred-Pre: 74 %
FEV6-%Change-Post: 8 %
FEV6-%Pred-Post: 88 %
FEV6-%Pred-Pre: 81 %
FEV6-Post: 3.22 L
FEV6-Pre: 2.96 L
FEV6FVC-%Change-Post: 1 %
FEV6FVC-%Pred-Post: 101 %
FEV6FVC-%Pred-Pre: 100 %
FVC-%Change-Post: 7 %
FVC-%Pred-Post: 87 %
FVC-%Pred-Pre: 81 %
FVC-Post: 3.25 L
FVC-Pre: 3.02 L
Post FEV1/FVC ratio: 63 %
Post FEV6/FVC ratio: 99 %
Pre FEV1/FVC ratio: 60 %
Pre FEV6/FVC Ratio: 98 %
RV % pred: 185 %
RV: 3.3 L
TLC % pred: 118 %
TLC: 6.16 L

## 2023-04-26 NOTE — Patient Instructions (Signed)
Full PFT performed today. °

## 2023-04-26 NOTE — Progress Notes (Signed)
Full PFT performed today. °

## 2023-04-28 ENCOUNTER — Other Ambulatory Visit: Payer: Self-pay

## 2023-04-28 ENCOUNTER — Ambulatory Visit: Payer: 59 | Admitting: Pulmonary Disease

## 2023-05-03 DIAGNOSIS — E878 Other disorders of electrolyte and fluid balance, not elsewhere classified: Secondary | ICD-10-CM | POA: Diagnosis not present

## 2023-05-03 DIAGNOSIS — R946 Abnormal results of thyroid function studies: Secondary | ICD-10-CM | POA: Diagnosis not present

## 2023-05-08 ENCOUNTER — Inpatient Hospital Stay: Payer: 59 | Attending: Oncology | Admitting: Oncology

## 2023-05-08 ENCOUNTER — Inpatient Hospital Stay: Payer: 59

## 2023-05-08 ENCOUNTER — Encounter: Payer: Self-pay | Admitting: Oncology

## 2023-05-08 VITALS — BP 146/89 | HR 85 | Temp 98.0°F | Resp 18 | Ht 65.0 in | Wt 298.3 lb

## 2023-05-08 DIAGNOSIS — Z5112 Encounter for antineoplastic immunotherapy: Secondary | ICD-10-CM | POA: Insufficient documentation

## 2023-05-08 DIAGNOSIS — Z6841 Body Mass Index (BMI) 40.0 and over, adult: Secondary | ICD-10-CM

## 2023-05-08 DIAGNOSIS — E1165 Type 2 diabetes mellitus with hyperglycemia: Secondary | ICD-10-CM | POA: Diagnosis not present

## 2023-05-08 DIAGNOSIS — Z79899 Other long term (current) drug therapy: Secondary | ICD-10-CM | POA: Insufficient documentation

## 2023-05-08 DIAGNOSIS — C4371 Malignant melanoma of right lower limb, including hip: Secondary | ICD-10-CM | POA: Diagnosis not present

## 2023-05-08 DIAGNOSIS — L989 Disorder of the skin and subcutaneous tissue, unspecified: Secondary | ICD-10-CM | POA: Diagnosis not present

## 2023-05-08 DIAGNOSIS — R5382 Chronic fatigue, unspecified: Secondary | ICD-10-CM | POA: Diagnosis not present

## 2023-05-08 LAB — CBC WITH DIFFERENTIAL/PLATELET
Abs Immature Granulocytes: 0.17 10*3/uL — ABNORMAL HIGH (ref 0.00–0.07)
Basophils Absolute: 0.1 10*3/uL (ref 0.0–0.1)
Basophils Relative: 1 %
Eosinophils Absolute: 0.2 10*3/uL (ref 0.0–0.5)
Eosinophils Relative: 2 %
HCT: 38.7 % (ref 36.0–46.0)
Hemoglobin: 13.1 g/dL (ref 12.0–15.0)
Immature Granulocytes: 2 %
Lymphocytes Relative: 20 %
Lymphs Abs: 2.3 10*3/uL (ref 0.7–4.0)
MCH: 28.6 pg (ref 26.0–34.0)
MCHC: 33.9 g/dL (ref 30.0–36.0)
MCV: 84.5 fL (ref 80.0–100.0)
Monocytes Absolute: 0.9 10*3/uL (ref 0.1–1.0)
Monocytes Relative: 8 %
Neutro Abs: 7.6 10*3/uL (ref 1.7–7.7)
Neutrophils Relative %: 67 %
Platelets: 285 10*3/uL (ref 150–400)
RBC: 4.58 MIL/uL (ref 3.87–5.11)
RDW: 13.2 % (ref 11.5–15.5)
WBC: 11.3 10*3/uL — ABNORMAL HIGH (ref 4.0–10.5)
nRBC: 0 % (ref 0.0–0.2)

## 2023-05-08 LAB — COMPREHENSIVE METABOLIC PANEL
ALT: 18 U/L (ref 0–44)
AST: 18 U/L (ref 15–41)
Albumin: 3.5 g/dL (ref 3.5–5.0)
Alkaline Phosphatase: 95 U/L (ref 38–126)
Anion gap: 14 (ref 5–15)
BUN: 12 mg/dL (ref 6–20)
CO2: 23 mmol/L (ref 22–32)
Calcium: 9.5 mg/dL (ref 8.9–10.3)
Chloride: 101 mmol/L (ref 98–111)
Creatinine, Ser: 0.69 mg/dL (ref 0.44–1.00)
GFR, Estimated: >60 mL/min (ref >60)
Glucose, Bld: 117 mg/dL — ABNORMAL HIGH (ref 70–99)
Potassium: 3.7 mmol/L (ref 3.5–5.1)
Sodium: 138 mmol/L (ref 135–145)
Total Bilirubin: 0.5 mg/dL (ref 0.3–1.2)
Total Protein: 6.8 g/dL (ref 6.5–8.1)

## 2023-05-08 LAB — CORTISOL: Cortisol, Plasma: 5.8 ug/dL

## 2023-05-08 LAB — TSH: TSH: 2.009 u[IU]/mL (ref 0.350–4.500)

## 2023-05-08 LAB — T4, FREE: Free T4: 0.73 ng/dL (ref 0.61–1.12)

## 2023-05-08 NOTE — Progress Notes (Signed)
[facility] Cancer Initial Visit:  Patient Care Team: [name], [name], MD as PCP - General (Family Medicine)  CHIEF COMPLAINTS/PURPOSE OF CONSULTATION:  Oncology History  Malignant melanoma of right lower leg (HCC)  11/15/2022 Initial Diagnosis   Malignant melanoma of right lower leg (HCC)   12/12/2022 Cancer Staging   Staging form: Melanoma of the Skin, AJCC 8th Edition - Clinical stage from 12/12/2022: Stage III (cT4b, cN1a, cM0) - Signed by [name], MD on 12/12/2022 Histopathologic type: Nodular melanoma Stage prefix: Initial diagnosis   01/03/2023 -  Chemotherapy   Patient is on Treatment Plan : MELANOMA Pembrolizumab (200) q21d       HISTORY OF PRESENTING ILLNESS: Diana Odom 47 y.o. female is here because of  malignant melanoma Medical history notable for tobacco use, diabetes mellitus type 2, hypertension, OSA on CPAP  October 25, 2022: Presented to dermatology with a red, raised, nodular lesion on right anterior shin which have been present for over a year and a half.  It began as a dark mole and had grown slowly over the period of time.  Patient underwent excisional biopsy followed by electrodesiccation and curettage Pathology demonstrated malignant melanoma at least 5 mm thick.  Deep margin was involved ulceration present mitotic index 5/mm.  Tumor infiltrating lymphocyte not brisk  Patient was subsequently referred to surgery for additional management  November 23, 2022: Wide local excision of malignant melanoma with resulting 6 cm diameter circular wound; right inguinal sentinel lymph node biopsy Pathology from the excisional biopsy site demonstrated residual melanoma in situ, no residual invasive disease, LVI invasion seen, margins free of malignancy.  No change in stage from T4b.  Sentinel lymph node positive for melanoma (3 separate aggregate blocks) no extracapsular extension Pathologic stage IIIC (T4b, N1 M0)  December 12 2022:  [facility] Consult  States that her FSGB's run from 185 to 215  Feels down and depressed.  Eating a lot due to her emotions.    Social:  Divorced.  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   Quit smoking since her surgery in March 2024.  Formerly smoked 1 ppd.  EtOH drinks once every few weeks.    [facility] Mother died 46 CHF Father died late 2's CHF and COPD Sister alive 55 well Brother alive 43 CAD, DM Type II, COPD   December 16 2022:  PET/CT (Melanoma protocol) No evidence of residual melanoma or metastatic melanoma on whole-body scan.  Postsurgical seroma in the RIGHT inguinal region without  hypermetabolic lymph nodes in the RIGHT groin. Mild metabolic activity associated wide local excisions surgical site RIGHT lower extremity. Two small pulmonary nodules are favored un-related to melanoma.   MRI brain No evidence of intracranial metastatic disease.   December 20 2022:   Reviewed results of imaging and labs with patient and sister.  Discussed role of adjuvant immunotherapy.  Discussed risks and benefits of this.    January 03 2023:  Cycle 1 Keytruda  January 05 2023:  Has tolerated it well.  No pruritus, rash, nausea or emesis.  Provided reassuance  January 11 2023:  Port placed.    Jan 23, 2023:   Has lost 5 lbs.  Recovering from a bout of diarrhea from the weekend of March 26th; helped by imodium.  Appetite better but overall diminished.  Slight erythematous rash on wrists, resolving.  Slight nausea, no emesis.  Sentinel LN bx site healing and no longer draining.   CMP notable for  sodium 131  Glucose 260  Jan 25, 2023;  Cycle 2 Keytruda  Feb 06 2023:  Scheduled follow-up for melanoma.  Has lost 5 lbs.   States that she did not feel right after receiving last dose of Keytruda; light headed, fatigued, stomach upset (nausea/diarrhea).  Was fine the next day and did well until 3 days ago when developed GI symptoms, SOB and generalized myalgias.  Not smoking.  Using CPAP.  Has a wheeze.  No  pleuritic chest pain. Started Chantix about a month ago.  Having vivid dreams.   Glucose 215.  CK 40.  CRP 2.3 sed rate 17.  Plasma cortisol 4.6.  TSH 0.879.  Free T41.19.  ANA panel negative.  Rheumatoid factor negative  Feb 14, 2023:  Reviewed results of labs with patient.  Feels OK.  Has gained 2 lbs.  Last week was placed on Prednisone and Z pack for bronchitis. Not wheezing as badly since she was treated.  She has seasonal allergies.  Remains a non-smoker.    Feb 15 2923:  Cycle 3 Keytruda   March 08, 2023:  Cycle 4 Keytruda   March 27, 2023:    Has lost 7 lbs.  Had chest heaviness and wheezing with last cycle.  Was placed on Singular and prednisone with the latter causing hyperglycemia requiring institution of insulin.  Now in range 70% compared with 10% of the time.  Wearing a continuous glucose monitor.    March 29, 2023:  Cycle 5 Keytruda   April 17 2023:  Feeling well.  Gained 5 lbs.  FSBG's averaging 160 which is better than in the past.    April 19 2023:  Cycle 6 Keytruda  April 21 2023:  Colonoscopy- Pedunculated internal hemorrhoid.    April 26 2023: Excision biopsy of skin lesion on back at [facility].    May 08 2023:  Scheduled follow-up for management of malignant melanoma.  PET/CT not performed- denied by insurance.  Results of excision bx by Dermatology pending.  Struggling with fatigue which makes it difficult for her to get up in the AM and go to work.  FSBG's averaging 172 for the last 14 days; in range 65%.  States she is doing fine with CPAP.  Discussed fatigue with PCP.  Several people have tested positive for COVID in her workplace; patient has tested negative as late as this AM.    May 10 2023:  Cycle 7 Keytruda  Review of Systems  Constitutional:  Positive for fatigue. Negative for appetite change, chills and fever.       Has been gaining weight due to increased eating  HENT:   Negative for nosebleeds and voice change.        Occasional sores on  tongue and sore throat.  Occasional trouble swallowing.   Eyes:  Negative for icterus.       Vision worsened since diagnosed with DM.  Has not seen opthalmologist  Respiratory:  Negative for chest tightness, cough, hemoptysis, shortness of breath and wheezing.        PND:  none Orthopnea:  none DOE:    Cardiovascular:  Positive for palpitations. Negative for chest pain and leg swelling.       PND:  none Orthopnea:  none  Gastrointestinal:  Negative for abdominal pain, blood in stool, constipation, nausea and vomiting.       Some diarrhea due to metformen   Endocrine: Negative for hot flashes.       Heat intolerance  Genitourinary:  Negative for bladder incontinence, difficulty urinating, dysuria, frequency, hematuria and nocturia.   Musculoskeletal:  Negative for arthralgias, back pain, gait problem, myalgias, neck pain and neck stiffness.  Skin:  Negative for itching, rash and wound.  Neurological:  Negative for dizziness, extremity weakness, gait problem, light-headedness, numbness and speech difficulty.       Has some stress headaches  Hematological:  Negative for adenopathy. Bruises/bleeds easily.  Psychiatric/Behavioral:  Positive for depression. Negative for suicidal ideas. The patient is not nervous/anxious.     MEDICAL HISTORY: Past Medical History:  Diagnosis Date   Cancer (HCC)    Depression    Diabetes mellitus without complication (HCC)    Hypertension    Melanoma (HCC)    Sleep apnea     SURGICAL HISTORY: Past Surgical History:  Procedure Laterality Date   CHOLECYSTECTOMY     MELANOMA EXCISION WITH SENTINEL LYMPH NODE BIOPSY      SOCIAL HISTORY: Social History   Socioeconomic History   Marital status: Divorced    Spouse name: Not on file   Number of children: 2   Years of education: 12   Highest education level: 12th grade  Occupational History    Comment: Print production planner  Tobacco Use   Smoking status: Former    Current packs/day: 0.00    Average  packs/day: 0.5 packs/day for 30.0 years (15.0 ttl pk-yrs)    Types: Cigarettes    Start date: 11/22/1992    Quit date: 11/23/2022    Years since quitting: 0.4   Smokeless tobacco: Not on file  Vaping Use   Vaping status: Some Days   Substances: CBD   Devices: CBD vape occasionally  Substance and Sexual Activity   Alcohol use: Never   Drug use: No    Types: Marijuana    Comment: former marijuana smoker   Sexual activity: Not Currently  Other Topics Concern   Not on file  Social History Narrative   Not on file   Social Determinants of Health   Financial Resource Strain: Medium Risk (12/28/2022)   Overall Financial Resource Strain (CARDIA)    Difficulty of Paying Living Expenses: Somewhat hard  Food Insecurity: Low Risk  (03/06/2023)   Received from [facility]   Food vital sign    Within the past 12 months, you worried that your food would run out before you got money to buy more: Never true    Within the past 12 months, the food you bought just didn't last and you didn't have money to get more. : Never true  Transportation Needs: Not on file (03/06/2023)  Physical Activity: Inactive (12/28/2022)   Exercise Vital Sign    Days of Exercise per Week: 0 days    Minutes of Exercise per Session: 0 min  Stress: Stress Concern Present (12/28/2022)   [employer] of Occupational Health - Occupational Stress Questionnaire    Feeling of Stress : Rather much  Social Connections: Moderately Isolated (12/28/2022)   Social Connection and Isolation Panel [NHANES]    Frequency of Communication with Friends and Family: More than three times a week    Frequency of Social Gatherings with Friends and Family: More than three times a week    Attends Religious Services: 1 to 4 times per year    Active Member of [employer] or Organizations: No    Attends Banker Meetings: Never    Marital Status: Divorced  Catering manager Violence: Not At Risk (12/12/2022)   Humiliation, Afraid, Rape, and  Kick  questionnaire    Fear of Current or Ex-Partner: No    Emotionally Abused: No    Physically Abused: No    Sexually Abused: No    FAMILY HISTORY No family history on file.  ALLERGIES:  has No Known Allergies.  MEDICATIONS:  Current Outpatient Medications  Medication Sig Dispense Refill   atorvastatin (LIPITOR) 10 MG tablet Take 10 mg by mouth at bedtime.     albuterol (VENTOLIN HFA) 108 (90 Base) MCG/ACT inhaler Inhale 2 puffs into the lungs every 6 (six) hours as needed for wheezing or shortness of breath. 8 g 2   FLUoxetine (PROZAC) 20 MG tablet Take 20 mg by mouth daily.     hydrochlorothiazide (HYDRODIURIL) 25 MG tablet Take by mouth.     hydrOXYzine (ATARAX) 25 MG tablet Take 25 mg by mouth 2 (two) times daily.     lidocaine-prilocaine (EMLA) cream Apply to affected area once 30 g 3   lisinopril (ZESTRIL) 20 MG tablet Take 1 tablet by mouth daily.     metFORMIN (GLUCOPHAGE) 1000 MG tablet Take 1 tablet (1,000 mg total) by mouth 2 (two) times daily with a meal. 60 tablet 1   metoprolol succinate (TOPROL-XL) 25 MG 24 hr tablet Take by mouth.     montelukast (SINGULAIR) 10 MG tablet Take 10 mg by mouth daily.     NOVOLOG FLEXPEN 100 UNIT/ML FlexPen Inject into the skin as directed. Sliding scale     ondansetron (ZOFRAN) 8 MG tablet Take 1 tablet (8 mg total) by mouth every 8 (eight) hours as needed for nausea or vomiting. 30 tablet 1   potassium chloride (MICRO-K) 10 MEQ CR capsule Take 10 mEq by mouth 2 (two) times daily.     prochlorperazine (COMPAZINE) 10 MG tablet Take 1 tablet (10 mg total) by mouth every 6 (six) hours as needed for nausea or vomiting. 30 tablet 1   TRESIBA FLEXTOUCH 100 UNIT/ML FlexTouch Pen Inject 10 Units into the skin at bedtime.     TRULICITY 1.5 MG/0.5ML SOPN Inject 1.5 mg into the skin once a week.     varenicline (CHANTIX) 1 MG tablet Take 1 mg by mouth 2 (two) times daily.     No current facility-administered medications for this visit.     PHYSICAL EXAMINATION:  ECOG PERFORMANCE STATUS: 0 - Asymptomatic   Vitals:   05/08/23 1441  BP: (!) 146/89  Pulse: 85  Resp: 18  Temp: 98 F (36.7 C)  SpO2: 98%     Filed Weights   05/08/23 1441  Weight: 298 lb 4.8 oz (135.3 kg)      Physical Exam Vitals and nursing note reviewed.  Constitutional:      Appearance: Normal appearance. She is obese. She is not toxic-appearing or diaphoretic.     Comments: Here alone.    HENT:     Head: Normocephalic and atraumatic.     Right Ear: External ear normal.     Left Ear: External ear normal.     Nose: Nose normal. No congestion or rhinorrhea.  Eyes:     General: No scleral icterus.    Extraocular Movements: Extraocular movements intact.     Conjunctiva/sclera: Conjunctivae normal.     Pupils: Pupils are equal, round, and reactive to light.  Cardiovascular:     Rate and Rhythm: Normal rate.     Heart sounds: No murmur heard.    No friction rub. No gallop.  Abdominal:     General: Bowel sounds are normal.  Palpations: Abdomen is soft.  Musculoskeletal:        General: No swelling, tenderness or deformity.     Cervical back: Normal range of motion and neck supple. No rigidity or tenderness.  Lymphadenopathy:     Head:     Right side of head: No submental, submandibular, tonsillar, preauricular, posterior auricular or occipital adenopathy.     Left side of head: No submental, submandibular, tonsillar, preauricular, posterior auricular or occipital adenopathy.     Cervical: No cervical adenopathy.     Right cervical: No superficial, deep or posterior cervical adenopathy.    Left cervical: No superficial, deep or posterior cervical adenopathy.     Upper Body:     Right upper body: No supraclavicular, axillary, pectoral or epitrochlear adenopathy.     Left upper body: No supraclavicular, axillary, pectoral or epitrochlear adenopathy.  Skin:    General: Skin is warm and dry.     Coloration: Skin is not jaundiced.      Comments: Flushing of cheeks  Neurological:     General: No focal deficit present.     Mental Status: She is alert and oriented to person, place, and time. Mental status is at baseline.     Cranial Nerves: No cranial nerve deficit.  Psychiatric:        Mood and Affect: Mood normal.        Behavior: Behavior normal.        Thought Content: Thought content normal.        Judgment: Judgment normal.     Comments: Anxious     LABORATORY DATA: I have personally reviewed the data as listed:  Clinical Support on 04/26/2023  Component Date Value Ref Range Status   FVC-Pre 04/26/2023 3.02  L Preliminary   FVC-%Pred-Pre 04/26/2023 81  % Preliminary   FVC-Post 04/26/2023 3.25  L Preliminary   FVC-%Pred-Post 04/26/2023 87  % Preliminary   FVC-%Change-Post 04/26/2023 7  % Preliminary   FEV1-Pre 04/26/2023 1.81  L Preliminary   FEV1-%Pred-Pre 04/26/2023 61  % Preliminary   FEV1-Post 04/26/2023 2.05  L Preliminary   FEV1-%Pred-Post 04/26/2023 69  % Preliminary   FEV1-%Change-Post 04/26/2023 13  % Preliminary   FEV6-Pre 04/26/2023 2.96  L Preliminary   FEV6-%Pred-Pre 04/26/2023 81  % Preliminary   FEV6-Post 04/26/2023 3.22  L Preliminary   FEV6-%Pred-Post 04/26/2023 88  % Preliminary   FEV6-%Change-Post 04/26/2023 8  % Preliminary   Pre FEV1/FVC ratio 04/26/2023 60  % Preliminary   FEV1FVC-%Pred-Pre 04/26/2023 74  % Preliminary   Post FEV1/FVC ratio 04/26/2023 63  % Preliminary   FEV1FVC-%Change-Post 04/26/2023 5  % Preliminary   Pre FEV6/FVC Ratio 04/26/2023 98  % Preliminary   FEV6FVC-%Pred-Pre 04/26/2023 100  % Preliminary   Post FEV6/FVC ratio 04/26/2023 99  % Preliminary   FEV6FVC-%Pred-Post 04/26/2023 101  % Preliminary   FEV6FVC-%Change-Post 04/26/2023 1  % Preliminary   FEF 25-75 Pre 04/26/2023 0.94  L/sec Preliminary   FEF2575-%Pred-Pre 04/26/2023 32  % Preliminary   FEF 25-75 Post 04/26/2023 1.39  L/sec Preliminary   FEF2575-%Pred-Post 04/26/2023 47  % Preliminary    FEF2575-%Change-Post 04/26/2023 47  % Preliminary   RV 04/26/2023 3.30  L Preliminary   RV % pred 04/26/2023 185  % Preliminary   TLC 04/26/2023 6.16  L Preliminary   TLC % pred 04/26/2023 118  % Preliminary   DLCO unc 04/26/2023 25.61  ml/min/mmHg Preliminary   DLCO unc % pred 04/26/2023 116  % Preliminary   DLCO cor  04/26/2023 25.85  ml/min/mmHg Preliminary   DLCO cor % pred 04/26/2023 117  % Preliminary   DL/VA 96/29/5284 1.32  ml/min/mmHg/L Preliminary   DL/VA % pred 44/09/270 536  % Preliminary  Appointment on 04/17/2023  Component Date Value Ref Range Status   WBC Count 04/17/2023 9.9  4.0 - 10.5 K/uL Final   RBC 04/17/2023 4.62  3.87 - 5.11 MIL/uL Final   Hemoglobin 04/17/2023 13.1  12.0 - 15.0 g/dL Final   HCT 64/40/3474 39.1  36.0 - 46.0 % Final   MCV 04/17/2023 84.6  80.0 - 100.0 fL Final   MCH 04/17/2023 28.4  26.0 - 34.0 pg Final   MCHC 04/17/2023 33.5  30.0 - 36.0 g/dL Final   RDW 25/95/6387 13.2  11.5 - 15.5 % Final   Platelet Count 04/17/2023 269  150 - 400 K/uL Final   nRBC 04/17/2023 0.0  0.0 - 0.2 % Final   Neutrophils Relative % 04/17/2023 66  % Final   Neutro Abs 04/17/2023 6.6  1.7 - 7.7 K/uL Final   Lymphocytes Relative 04/17/2023 22  % Final   Lymphs Abs 04/17/2023 2.2  0.7 - 4.0 K/uL Final   Monocytes Relative 04/17/2023 8  % Final   Monocytes Absolute 04/17/2023 0.8  0.1 - 1.0 K/uL Final   Eosinophils Relative 04/17/2023 3  % Final   Eosinophils Absolute 04/17/2023 0.3  0.0 - 0.5 K/uL Final   Basophils Relative 04/17/2023 1  % Final   Basophils Absolute 04/17/2023 0.1  0.0 - 0.1 K/uL Final   Immature Granulocytes 04/17/2023 0  % Final   Abs Immature Granulocytes 04/17/2023 0.03  0.00 - 0.07 K/uL Final   Performed at [facility], [address] [place], [place] [address]   Sodium 04/17/2023 138  135 - 145 mmol/L Final   Potassium 04/17/2023 3.7  3.5 - 5.1 mmol/L Final   Chloride 04/17/2023 103  98 - 111 mmol/L Final   CO2 04/17/2023  26  22 - 32 mmol/L Final   Glucose, Bld 04/17/2023 107 (H)  70 - 99 mg/dL Final   Glucose reference range applies only to samples taken after fasting for at least 8 hours.   BUN 04/17/2023 10  6 - 20 mg/dL Final   Creatinine 29/51/8841 0.78  0.44 - 1.00 mg/dL Final   Calcium 66/02/3015 9.3  8.9 - 10.3 mg/dL Final   Total Protein 09/27/3233 6.8  6.5 - 8.1 g/dL Final   Albumin 57/32/2025 3.5  3.5 - 5.0 g/dL Final   AST 42/70/6237 20  15 - 41 U/L Final   ALT 04/17/2023 23  0 - 44 U/L Final   Alkaline Phosphatase 04/17/2023 89  38 - 126 U/L Final   Total Bilirubin 04/17/2023 0.4  0.3 - 1.2 mg/dL Final   GFR, Estimated 04/17/2023 >60  >60 mL/min Final   Comment: (NOTE) Calculated using the CKD-EPI Creatinine Equation (2021)    Anion gap 04/17/2023 9  5 - 15 Final   Performed at [facility], [address] [place], [place] [address]   T4, Total 04/17/2023 11.7  4.5 - 12.0 ug/dL Final   Comment: (NOTE) Performed At: [facility] [address] [place], [place] [id number] Jolene Schimke MD [id number]    TSH 04/17/2023 0.023 (L)  0.350 - 4.500 uIU/mL Final   Comment: Performed by a 3rd Generation assay with a functional sensitivity of <=0.01 uIU/mL. Performed at [facility], [address] [place], [place] [address]  RADIOGRAPHIC STUDIES: I have personally reviewed the radiological images as listed and agree with the findings in the report  No results found.  ASSESSMENT/PLAN 47 y.o. female is here because of  malignant melanoma.  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  Biopsied November 23 2022- Wide local excision and sentinel LN bx.  (Patient did not have clinically apparent LN's by history, nor in transit  disease) December 12 2022- Obtain BRAF mutation studies on surgical material December 16 2022 -PET/CT whole body.  No residual melanoma or metastatic disease.  Two small pulmonary nodules thought unrelated. MRI brain negative  Therapeutics   December 12 2022- Discussed use of adjuvant therapy to prevent recurrence.   December 20 2022- Per NCCN guidelines recommended adjuvant Pembrolizumab  January 03 2023- Cycle 1 Keytruda  Jan 23 2023- Slight skin rash which is being managed with low potency topical steroids  Jan 25 2023- Cycle 2 Keytruda  Feb 06 2023- Labs to evaluate for possible autoimmune symptoms from [facility] notable only for mild elevation in CRP.    Feb 14 2023- Reviewed results of labs with patient.  Suspect that some of sx are due to inadequately controlled DM Type II and recent bout of asthma/bronchitis  Feb 15 2023- Cycle 3 Keytruda  March 08 2023- Cycle 4 Keytruda  March 29, 2023:  Cycle 5 Keytruda   April 17 2023- Arrange for follow up whole body PET/CT  April 19 2023- Cycle 6 Keytruda   May 08 2023- Results of excision bx performed by dermatology pending.  PET/CT denied by insurance company  May 10 2023- Cycle 7 Keytruda  New skin lesions  April 26 2023- To undergo excision bx of skin lesion on back.  Will obtain path reports.    Fatigue  May 08 2023- Multifactorial etiology with likely causes being 1) Keytruda 2) poorly controlled DM type II 3) obesity 4) depression  Discussed these concerns with patient's pcp  Vivid dreams  Feb 06 2023- Began after starting Chantix for smoking cessation.  This is a known side effect.    Poor venous access:     December 22 2022- Portacath placed  Post operative seroma-  December 20 2022- Mild and should resolve with conservative management.  A common problem with inguinal lymphadenectomy.  Other risk factors are obesity, DM Type II and tobacco use  Jan 23 2023- Resolved with conservative management    Cancer Staging  Malignant melanoma of right  lower leg [facility]) Staging form: Melanoma of the Skin, AJCC 8th Edition - Clinical stage from 12/12/2022: Stage III (cT4b, cN1a, cM0) - Signed by [name], MD on 12/12/2022 Histopathologic type: Nodular melanoma Stage prefix: Initial diagnosis    No problem-specific Assessment & Plan notes found for this encounter.    Orders Placed This Encounter  Procedures   CBC with Differential/Platelet   Comprehensive metabolic panel    Standing Status:   Future    Standing Expiration Date:   05/07/2024   TSH    Standing Status:   Future    Standing Expiration Date:   05/07/2024   T4, free    Standing Status:   Future    Standing Expiration Date:   05/07/2024   Cortisol    Standing Status:   Future    Standing Expiration  Date:   05/07/2024    30  minutes was spent in patient care.  This included time spent preparing to see the patient (e.g., review of tests), obtaining and/or reviewing separately obtained history, counseling and educating the patient/family/caregiver, ordering tests, or procedures; documenting clinical information in the electronic or other health record, independently interpreting results and communicating results to the patient/family/caregiver as well as coordination of care.       All questions were answered. The patient knows to call the clinic with any problems, questions or concerns.  This note was electronically signed.    [name], MD  05/08/2023 2:57 PM

## 2023-05-10 ENCOUNTER — Inpatient Hospital Stay: Payer: 59

## 2023-05-10 VITALS — BP 146/74 | HR 81 | Temp 98.0°F | Resp 18

## 2023-05-10 DIAGNOSIS — C4371 Malignant melanoma of right lower limb, including hip: Secondary | ICD-10-CM

## 2023-05-10 DIAGNOSIS — Z79899 Other long term (current) drug therapy: Secondary | ICD-10-CM | POA: Diagnosis not present

## 2023-05-10 DIAGNOSIS — Z5112 Encounter for antineoplastic immunotherapy: Secondary | ICD-10-CM | POA: Diagnosis not present

## 2023-05-10 MED ORDER — HEPARIN SOD (PORK) LOCK FLUSH 100 UNIT/ML IV SOLN: 500.0000 [IU] | Freq: Once | INTRAVENOUS | Status: DC | PRN

## 2023-05-10 MED ORDER — SODIUM CHLORIDE 0.9 % IV SOLN: Freq: Once | INTRAVENOUS | Status: AC

## 2023-05-10 MED ORDER — SODIUM CHLORIDE 0.9 % IV SOLN
200.0000 mg | Freq: Once | INTRAVENOUS | Status: AC
  Administered 2023-05-10: 200 mg via INTRAVENOUS
  Filled 2023-05-10: qty 8

## 2023-05-10 MED ORDER — SODIUM CHLORIDE 0.9% FLUSH: 10.0000 mL | INTRAVENOUS | Status: DC | PRN

## 2023-05-10 NOTE — Patient Instructions (Signed)

## 2023-05-15 DIAGNOSIS — C4371 Malignant melanoma of right lower limb, including hip: Secondary | ICD-10-CM | POA: Diagnosis not present

## 2023-05-15 DIAGNOSIS — K648 Other hemorrhoids: Secondary | ICD-10-CM | POA: Diagnosis not present

## 2023-05-16 ENCOUNTER — Encounter: Payer: Self-pay | Admitting: Oncology

## 2023-05-16 DIAGNOSIS — R5383 Other fatigue: Secondary | ICD-10-CM | POA: Insufficient documentation

## 2023-05-24 DIAGNOSIS — Z794 Long term (current) use of insulin: Secondary | ICD-10-CM | POA: Diagnosis not present

## 2023-05-24 DIAGNOSIS — K621 Rectal polyp: Secondary | ICD-10-CM | POA: Diagnosis not present

## 2023-05-24 DIAGNOSIS — Z79899 Other long term (current) drug therapy: Secondary | ICD-10-CM | POA: Diagnosis not present

## 2023-05-24 DIAGNOSIS — I10 Essential (primary) hypertension: Secondary | ICD-10-CM | POA: Diagnosis not present

## 2023-05-24 DIAGNOSIS — K620 Anal polyp: Secondary | ICD-10-CM | POA: Diagnosis not present

## 2023-05-24 DIAGNOSIS — K648 Other hemorrhoids: Secondary | ICD-10-CM | POA: Diagnosis not present

## 2023-05-25 DIAGNOSIS — B3731 Acute candidiasis of vulva and vagina: Secondary | ICD-10-CM | POA: Diagnosis not present

## 2023-05-25 DIAGNOSIS — Z6841 Body Mass Index (BMI) 40.0 and over, adult: Secondary | ICD-10-CM | POA: Diagnosis not present

## 2023-05-25 DIAGNOSIS — E1165 Type 2 diabetes mellitus with hyperglycemia: Secondary | ICD-10-CM | POA: Diagnosis not present

## 2023-05-25 DIAGNOSIS — F4322 Adjustment disorder with anxiety: Secondary | ICD-10-CM | POA: Diagnosis not present

## 2023-05-25 DIAGNOSIS — F332 Major depressive disorder, recurrent severe without psychotic features: Secondary | ICD-10-CM | POA: Diagnosis not present

## 2023-05-25 DIAGNOSIS — Z23 Encounter for immunization: Secondary | ICD-10-CM | POA: Diagnosis not present

## 2023-05-29 ENCOUNTER — Inpatient Hospital Stay: Payer: 59 | Attending: Oncology

## 2023-05-29 ENCOUNTER — Encounter: Payer: Self-pay | Admitting: Oncology

## 2023-05-29 ENCOUNTER — Inpatient Hospital Stay (HOSPITAL_BASED_OUTPATIENT_CLINIC_OR_DEPARTMENT_OTHER): Payer: 59 | Admitting: Oncology

## 2023-05-29 VITALS — BP 145/84 | HR 95 | Temp 98.4°F | Resp 18 | Ht 65.0 in | Wt 308.0 lb

## 2023-05-29 DIAGNOSIS — L989 Disorder of the skin and subcutaneous tissue, unspecified: Secondary | ICD-10-CM

## 2023-05-29 DIAGNOSIS — C4371 Malignant melanoma of right lower limb, including hip: Secondary | ICD-10-CM | POA: Diagnosis not present

## 2023-05-29 DIAGNOSIS — E1165 Type 2 diabetes mellitus with hyperglycemia: Secondary | ICD-10-CM

## 2023-05-29 DIAGNOSIS — R5382 Chronic fatigue, unspecified: Secondary | ICD-10-CM

## 2023-05-29 DIAGNOSIS — Z5112 Encounter for antineoplastic immunotherapy: Secondary | ICD-10-CM

## 2023-05-29 LAB — CBC WITH DIFFERENTIAL (CANCER CENTER ONLY)
Abs Immature Granulocytes: 0.07 10*3/uL (ref 0.00–0.07)
Basophils Absolute: 0.1 10*3/uL (ref 0.0–0.1)
Basophils Relative: 1 %
Eosinophils Absolute: 0.2 10*3/uL (ref 0.0–0.5)
Eosinophils Relative: 2 %
HCT: 38.8 % (ref 36.0–46.0)
Hemoglobin: 12.8 g/dL (ref 12.0–15.0)
Immature Granulocytes: 1 %
Lymphocytes Relative: 19 %
Lymphs Abs: 2.3 10*3/uL (ref 0.7–4.0)
MCH: 28.1 pg (ref 26.0–34.0)
MCHC: 33 g/dL (ref 30.0–36.0)
MCV: 85.1 fL (ref 80.0–100.0)
Monocytes Absolute: 0.7 10*3/uL (ref 0.1–1.0)
Monocytes Relative: 6 %
Neutro Abs: 8.7 10*3/uL — ABNORMAL HIGH (ref 1.7–7.7)
Neutrophils Relative %: 71 %
Platelet Count: 294 10*3/uL (ref 150–400)
RBC: 4.56 MIL/uL (ref 3.87–5.11)
RDW: 13.3 % (ref 11.5–15.5)
WBC Count: 12.1 10*3/uL — ABNORMAL HIGH (ref 4.0–10.5)
nRBC: 0 % (ref 0.0–0.2)

## 2023-05-29 LAB — CMP (CANCER CENTER ONLY)
ALT: 27 U/L (ref 0–44)
AST: 20 U/L (ref 15–41)
Albumin: 3.6 g/dL (ref 3.5–5.0)
Alkaline Phosphatase: 97 U/L (ref 38–126)
Anion gap: 12 (ref 5–15)
BUN: 15 mg/dL (ref 6–20)
CO2: 23 mmol/L (ref 22–32)
Calcium: 9.4 mg/dL (ref 8.9–10.3)
Chloride: 101 mmol/L (ref 98–111)
Creatinine: 0.65 mg/dL (ref 0.44–1.00)
GFR, Estimated: >60 mL/min (ref >60)
Glucose, Bld: 149 mg/dL — ABNORMAL HIGH (ref 70–99)
Potassium: 3.5 mmol/L (ref 3.5–5.1)
Sodium: 136 mmol/L (ref 135–145)
Total Bilirubin: 0.6 mg/dL (ref 0.3–1.2)
Total Protein: 7 g/dL (ref 6.5–8.1)

## 2023-05-29 LAB — HCG, QUANTITATIVE, PREGNANCY: hCG, Beta Chain, Quant, S: <1 m[IU]/mL (ref <5)

## 2023-05-29 LAB — T4, FREE: Free T4: 0.87 ng/dL (ref 0.61–1.12)

## 2023-05-29 LAB — TSH: TSH: 1.382 u[IU]/mL (ref 0.350–4.500)

## 2023-05-29 NOTE — Progress Notes (Unsigned)
[facility] Cancer Initial Visit:  Patient Care Team: [name], [name], MD as PCP - General (Family Medicine)  CHIEF COMPLAINTS/PURPOSE OF CONSULTATION:  Oncology History  Malignant melanoma of right lower leg (HCC)  11/15/2022 Initial Diagnosis   Malignant melanoma of right lower leg (HCC)   12/12/2022 Cancer Staging   Staging form: Melanoma of the Skin, AJCC 8th Edition - Clinical stage from 12/12/2022: Stage III (cT4b, cN1a, cM0) - Signed by [name], MD on 12/12/2022 Histopathologic type: Nodular melanoma Stage prefix: Initial diagnosis   01/03/2023 -  Chemotherapy   Patient is on Treatment Plan : MELANOMA Pembrolizumab (200) q21d       HISTORY OF PRESENTING ILLNESS: Diana Odom 47 y.o. female is here because of  malignant melanoma Medical history notable for tobacco use, diabetes mellitus type 2, hypertension, OSA on CPAP  October 25, 2022: Presented to dermatology with a red, raised, nodular lesion on right anterior shin which have been present for over a year and a half.  It began as a dark mole and had grown slowly over the period of time.  Patient underwent excisional biopsy followed by electrodesiccation and curettage Pathology demonstrated malignant melanoma at least 5 mm thick.  Deep margin was involved ulceration present mitotic index 5/mm.  Tumor infiltrating lymphocyte not brisk  Patient was subsequently referred to surgery for additional management  November 23, 2022: Wide local excision of malignant melanoma with resulting 6 cm diameter circular wound; right inguinal sentinel lymph node biopsy Pathology from the excisional biopsy site demonstrated residual melanoma in situ, no residual invasive disease, LVI invasion seen, margins free of malignancy.  No change in stage from T4b.  Sentinel lymph node positive for melanoma (3 separate aggregate blocks) no extracapsular extension Pathologic stage IIIC (T4b, N1 M0)  December 12 2022:  [facility] Consult  States that her FSGB's run from 185 to 215  Feels down and depressed.  Eating a lot due to her emotions.    Social:  Divorced.  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   Quit smoking since her surgery in March 2024.  Formerly smoked 1 ppd.  EtOH drinks once every few weeks.    [facility] Mother died 16 CHF Father died late 64's CHF and COPD Sister alive 30 well Brother alive 36 CAD, DM Type II, COPD   December 16 2022:  PET/CT (Melanoma protocol) No evidence of residual melanoma or metastatic melanoma on whole-body scan.  Postsurgical seroma in the RIGHT inguinal region without  hypermetabolic lymph nodes in the RIGHT groin. Mild metabolic activity associated wide local excisions surgical site RIGHT lower extremity. Two small pulmonary nodules are favored un-related to melanoma.   MRI brain No evidence of intracranial metastatic disease.   December 20 2022:   Reviewed results of imaging and labs with patient and sister.  Discussed role of adjuvant immunotherapy.  Discussed risks and benefits of this.    January 03 2023:  Cycle 1 Keytruda  January 05 2023:  Has tolerated it well.  No pruritus, rash, nausea or emesis.  Provided reassuance  January 11 2023:  Port placed.    Jan 23, 2023:   Has lost 5 lbs.  Recovering from a bout of diarrhea from the weekend of March 26th; helped by imodium.  Appetite better but overall diminished.  Slight erythematous rash on wrists, resolving.  Slight nausea, no emesis.  Sentinel LN bx site healing and no longer draining.   CMP notable for  sodium 131  Glucose 260  Jan 25, 2023;  Cycle 2 Keytruda  Feb 06 2023:  Scheduled follow-up for melanoma.  Has lost 5 lbs.   States that she did not feel right after receiving last dose of Keytruda; light headed, fatigued, stomach upset (nausea/diarrhea).  Was fine the next day and did well until 3 days ago when developed GI symptoms, SOB and generalized myalgias.  Not smoking.  Using CPAP.  Has a wheeze.  No  pleuritic chest pain. Started Chantix about a month ago.  Having vivid dreams.   Glucose 215.  CK 40.  CRP 2.3 sed rate 17.  Plasma cortisol 4.6.  TSH 0.879.  Free T41.19.  ANA panel negative.  Rheumatoid factor negative  Feb 14, 2023:  Reviewed results of labs with patient.  Feels OK.  Has gained 2 lbs.  Last week was placed on Prednisone and Z pack for bronchitis. Not wheezing as badly since she was treated.  She has seasonal allergies.  Remains a non-smoker.    Feb 15 2923:  Cycle 3 Keytruda   March 08, 2023:  Cycle 4 Keytruda   March 27, 2023:    Has lost 7 lbs.  Had chest heaviness and wheezing with last cycle.  Was placed on Singular and prednisone with the latter causing hyperglycemia requiring institution of insulin.  Now in range 70% compared with 10% of the time.  Wearing a continuous glucose monitor.    March 29, 2023:  Cycle 5 Keytruda   April 17 2023:  Feeling well.  Gained 5 lbs.  FSBG's averaging 160 which is better than in the past.    April 19 2023:  Cycle 6 Keytruda  April 21 2023:  Colonoscopy- Pedunculated internal hemorrhoid.    April 26 2023: Excision biopsy of skin lesion on back at [facility].    May 08 2023:  PET/CT not performed- denied by insurance.  Results of excision bx by Dermatology pending.  Struggling with fatigue which makes it difficult for her to get up in the AM and go to work.  FSBG's averaging 172 for the last 14 days; in range 65%.  States she is doing fine with CPAP.  Discussed fatigue with PCP.  Several people have tested positive for COVID in her workplace; patient has tested negative as late as this AM.    May 10 2023:  Cycle 7 Keytruda  May 29 2023:  Scheduled follow-up for management of malignant melanoma.  Underwent colonoscopy since last visit with removal of a polyp.  Has gained 10 lbs since last visit.  FSBG's were out of control; part of this was due to holding medication perioperatively.    May 31 2023:  Cycle 8  Keytruda  Review of Systems  Constitutional:  Positive for fatigue. Negative for appetite change, chills and fever.       Has been gaining weight due to increased eating  HENT:   Negative for nosebleeds and voice change.        Occasional sores on tongue and sore throat.  Occasional trouble swallowing.   Eyes:  Negative for icterus.       Vision worsened since diagnosed with DM.  Has not seen opthalmologist  Respiratory:  Negative for chest tightness, cough, hemoptysis, shortness of breath and wheezing.        PND:  none Orthopnea:  none DOE:    Cardiovascular:  Positive for palpitations. Negative for chest pain and leg swelling.  PND:  none Orthopnea:  none  Gastrointestinal:  Negative for abdominal pain, blood in stool, constipation, nausea and vomiting.       Some diarrhea due to metformen   Endocrine: Negative for hot flashes.       Heat intolerance  Genitourinary:  Negative for bladder incontinence, difficulty urinating, dysuria, frequency, hematuria and nocturia.   Musculoskeletal:  Negative for arthralgias, back pain, gait problem, myalgias, neck pain and neck stiffness.  Skin:  Negative for itching, rash and wound.  Neurological:  Negative for dizziness, extremity weakness, gait problem, light-headedness, numbness and speech difficulty.       Has some stress headaches  Hematological:  Negative for adenopathy. Bruises/bleeds easily.  Psychiatric/Behavioral:  Positive for depression. Negative for suicidal ideas. The patient is not nervous/anxious.     MEDICAL HISTORY: Past Medical History:  Diagnosis Date   Cancer (HCC)    Depression    Diabetes mellitus without complication (HCC)    Hypertension    Melanoma (HCC)    Sleep apnea     SURGICAL HISTORY: Past Surgical History:  Procedure Laterality Date   CHOLECYSTECTOMY     MELANOMA EXCISION WITH SENTINEL LYMPH NODE BIOPSY      SOCIAL HISTORY: Social History   Socioeconomic History   Marital status:  Divorced    Spouse name: Not on file   Number of children: 2   Years of education: 12   Highest education level: 12th grade  Occupational History    Comment: Print production planner  Tobacco Use   Smoking status: Former    Current packs/day: 0.00    Average packs/day: 0.5 packs/day for 30.0 years (15.0 ttl pk-yrs)    Types: Cigarettes    Start date: 11/22/1992    Quit date: 11/23/2022    Years since quitting: 0.5   Smokeless tobacco: Not on file  Vaping Use   Vaping status: Some Days   Substances: CBD   Devices: CBD vape occasionally  Substance and Sexual Activity   Alcohol use: Never   Drug use: No    Types: Marijuana    Comment: former marijuana smoker   Sexual activity: Not Currently  Other Topics Concern   Not on file  Social History Narrative   Not on file   Social Determinants of Health   Financial Resource Strain: Medium Risk (12/28/2022)   Overall Financial Resource Strain (CARDIA)    Difficulty of Paying Living Expenses: Somewhat hard  Food Insecurity: Low Risk  (03/06/2023)   Received from [facility]   Hunger Vital Sign    Worried About Running Out of Food in the Last Year: Never true    Ran Out of Food in the Last Year: Never true  Transportation Needs: Not on file (03/06/2023)  Physical Activity: Inactive (12/28/2022)   Exercise Vital Sign    Days of Exercise per Week: 0 days    Minutes of Exercise per Session: 0 min  Stress: Stress Concern Present (12/28/2022)   [employer] of Occupational Health - Occupational Stress Questionnaire    Feeling of Stress : Rather much  Social Connections: Moderately Isolated (12/28/2022)   Social Connection and Isolation Panel [NHANES]    Frequency of Communication with Friends and Family: More than three times a week    Frequency of Social Gatherings with Friends and Family: More than three times a week    Attends Religious Services: 1 to 4 times per year    Active Member of [employer] or Organizations: No    Attends [employer]  or  Organization Meetings: Never    Marital Status: Divorced  Catering manager Violence: Not At Risk (12/12/2022)   Humiliation, Afraid, Rape, and Kick questionnaire    Fear of Current or Ex-Partner: No    Emotionally Abused: No    Physically Abused: No    Sexually Abused: No    FAMILY HISTORY No family history on file.  ALLERGIES:  has No Known Allergies.  MEDICATIONS:  Current Outpatient Medications  Medication Sig Dispense Refill   FLUoxetine (PROZAC) 40 MG capsule Take 40 mg by mouth daily.     albuterol (VENTOLIN HFA) 108 (90 Base) MCG/ACT inhaler Inhale 2 puffs into the lungs every 6 (six) hours as needed for wheezing or shortness of breath. 8 g 2   atorvastatin (LIPITOR) 10 MG tablet Take 10 mg by mouth at bedtime.     hydrochlorothiazide (HYDRODIURIL) 25 MG tablet Take by mouth.     hydrOXYzine (ATARAX) 25 MG tablet Take 25 mg by mouth 2 (two) times daily.     lidocaine-prilocaine (EMLA) cream Apply to affected area once 30 g 3   lisinopril (ZESTRIL) 20 MG tablet Take 1 tablet by mouth daily.     metFORMIN (GLUCOPHAGE) 1000 MG tablet Take 1 tablet (1,000 mg total) by mouth 2 (two) times daily with a meal. 60 tablet 1   metoprolol succinate (TOPROL-XL) 25 MG 24 hr tablet Take by mouth.     montelukast (SINGULAIR) 10 MG tablet Take 10 mg by mouth daily.     NOVOLOG FLEXPEN 100 UNIT/ML FlexPen Inject into the skin as directed. Sliding scale     ondansetron (ZOFRAN) 8 MG tablet Take 1 tablet (8 mg total) by mouth every 8 (eight) hours as needed for nausea or vomiting. 30 tablet 1   potassium chloride (MICRO-K) 10 MEQ CR capsule Take 10 mEq by mouth 2 (two) times daily.     prochlorperazine (COMPAZINE) 10 MG tablet Take 1 tablet (10 mg total) by mouth every 6 (six) hours as needed for nausea or vomiting. 30 tablet 1   TRESIBA FLEXTOUCH 100 UNIT/ML FlexTouch Pen Inject 10 Units into the skin at bedtime.     TRULICITY 1.5 MG/0.5ML SOPN Inject 1.5 mg into the skin once a week.      varenicline (CHANTIX) 1 MG tablet Take 1 mg by mouth 2 (two) times daily.     No current facility-administered medications for this visit.    PHYSICAL EXAMINATION:  ECOG PERFORMANCE STATUS: 0 - Asymptomatic   Vitals:   05/29/23 1440 05/29/23 1443  BP: (!) 150/83 (!) 145/84  Pulse: 95   Resp: 18   Temp: 98.4 F (36.9 C)   SpO2: 97%      Filed Weights   05/29/23 1440  Weight: (!) 308 lb (139.7 kg)      Physical Exam Vitals and nursing note reviewed.  Constitutional:      Appearance: Normal appearance. She is obese. She is not toxic-appearing or diaphoretic.     Comments: Here alone.    HENT:     Head: Normocephalic and atraumatic.     Right Ear: External ear normal.     Left Ear: External ear normal.     Nose: Nose normal. No congestion or rhinorrhea.  Eyes:     General: No scleral icterus.    Extraocular Movements: Extraocular movements intact.     Conjunctiva/sclera: Conjunctivae normal.     Pupils: Pupils are equal, round, and reactive to light.  Cardiovascular:     Rate  and Rhythm: Normal rate.     Heart sounds: No murmur heard.    No friction rub. No gallop.  Abdominal:     General: Bowel sounds are normal.     Palpations: Abdomen is soft.  Musculoskeletal:        General: No swelling, tenderness or deformity.     Cervical back: Normal range of motion and neck supple. No rigidity or tenderness.  Lymphadenopathy:     Head:     Right side of head: No submental, submandibular, tonsillar, preauricular, posterior auricular or occipital adenopathy.     Left side of head: No submental, submandibular, tonsillar, preauricular, posterior auricular or occipital adenopathy.     Cervical: No cervical adenopathy.     Right cervical: No superficial, deep or posterior cervical adenopathy.    Left cervical: No superficial, deep or posterior cervical adenopathy.     Upper Body:     Right upper body: No supraclavicular, axillary, pectoral or epitrochlear adenopathy.      Left upper body: No supraclavicular, axillary, pectoral or epitrochlear adenopathy.  Skin:    General: Skin is warm and dry.     Coloration: Skin is not jaundiced.     Comments: Flushing of cheeks  Neurological:     General: No focal deficit present.     Mental Status: She is alert and oriented to person, place, and time. Mental status is at baseline.     Cranial Nerves: No cranial nerve deficit.  Psychiatric:        Mood and Affect: Mood normal.        Behavior: Behavior normal.        Thought Content: Thought content normal.        Judgment: Judgment normal.     Comments: Anxious     LABORATORY DATA: I have personally reviewed the data as listed:  Appointment on 05/08/2023  Component Date Value Ref Range Status   Sodium 05/08/2023 138  135 - 145 mmol/L Final   Potassium 05/08/2023 3.7  3.5 - 5.1 mmol/L Final   Chloride 05/08/2023 101  98 - 111 mmol/L Final   CO2 05/08/2023 23  22 - 32 mmol/L Final   Glucose, Bld 05/08/2023 117 (H)  70 - 99 mg/dL Final   Glucose reference range applies only to samples taken after fasting for at least 8 hours.   BUN 05/08/2023 12  6 - 20 mg/dL Final   Creatinine, Ser 05/08/2023 0.69  0.44 - 1.00 mg/dL Final   Calcium 16/06/9603 9.5  8.9 - 10.3 mg/dL Final   Total Protein 54/05/8118 6.8  6.5 - 8.1 g/dL Final   Albumin 14/78/2956 3.5  3.5 - 5.0 g/dL Final   AST 21/30/8657 18  15 - 41 U/L Final   ALT 05/08/2023 18  0 - 44 U/L Final   Alkaline Phosphatase 05/08/2023 95  38 - 126 U/L Final   Total Bilirubin 05/08/2023 0.5  0.3 - 1.2 mg/dL Final   GFR, Estimated 05/08/2023 >60  >60 mL/min Final   Comment: (NOTE) Calculated using the CKD-EPI Creatinine Equation (2021)    Anion gap 05/08/2023 14  5 - 15 Final   Performed at [facility], [address], [place], [place] [address]   Cortisol, Plasma 05/08/2023 5.8  ug/dL Final   Comment: (NOTE) AM    6.7 - 22.6 ug/dL PM   <29.5       ug/dL Performed at [facility], [address], [place], [place]  [address]    Free T4 05/08/2023 0.73  0.61 - 1.12 ng/dL Final   Comment: (NOTE) Biotin ingestion may interfere with free T4 tests. If the results are inconsistent with the TSH level, previous test results, or the clinical presentation, then consider biotin interference. If needed, order repeat testing after stopping biotin. Performed at [facility], [address] [place], [place] [address]    TSH 05/08/2023 2.009  0.350 - 4.500 uIU/mL Final   Comment: Performed by a 3rd Generation assay with a functional sensitivity of <=0.01 uIU/mL. Performed at [facility], [address], [place], [place] [address]   Office Visit on 05/08/2023  Component Date Value Ref Range Status   WBC 05/08/2023 11.3 (H)  4.0 - 10.5 K/uL Final   RBC 05/08/2023 4.58  3.87 - 5.11 MIL/uL Final   Hemoglobin 05/08/2023 13.1  12.0 - 15.0 g/dL Final   HCT 21/30/8657 38.7  36.0 - 46.0 % Final   MCV 05/08/2023 84.5  80.0 - 100.0 fL Final   MCH 05/08/2023 28.6  26.0 - 34.0 pg Final   MCHC 05/08/2023 33.9  30.0 - 36.0 g/dL Final   RDW 84/69/6295 13.2  11.5 - 15.5 % Final   Platelets 05/08/2023 285  150 - 400 K/uL Final   nRBC 05/08/2023 0.0  0.0 - 0.2 % Final   Neutrophils Relative % 05/08/2023 67  % Final   Neutro Abs 05/08/2023 7.6  1.7 - 7.7 K/uL Final   Lymphocytes Relative 05/08/2023 20  % Final   Lymphs Abs 05/08/2023 2.3  0.7 - 4.0 K/uL Final   Monocytes Relative 05/08/2023 8  % Final   Monocytes Absolute 05/08/2023 0.9  0.1 - 1.0 K/uL Final   Eosinophils Relative 05/08/2023 2  % Final   Eosinophils Absolute 05/08/2023 0.2  0.0 - 0.5 K/uL Final   Basophils Relative 05/08/2023 1  % Final   Basophils Absolute 05/08/2023 0.1  0.0 - 0.1 K/uL Final   Immature Granulocytes 05/08/2023 2  % Final   Abs Immature Granulocytes 05/08/2023 0.17 (H)  0.00 - 0.07 K/uL Final   Performed at [facility], [address], [place], [place]  [address]    RADIOGRAPHIC STUDIES: I have personally reviewed the radiological images as listed and agree with the findings in the report  No results found.  ASSESSMENT/PLAN 47 y.o. female is here because of  malignant melanoma.  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  Biopsied November 23 2022- Wide local excision and sentinel LN bx.  (Patient did not have clinically apparent LN's by history, nor in transit disease) December 12 2022- Obtain BRAF mutation studies on surgical material December 16 2022 -PET/CT whole body.  No residual melanoma or metastatic disease.  Two small pulmonary nodules thought unrelated. MRI brain negative  Therapeutics   December 12 2022- Discussed use of adjuvant therapy to prevent recurrence.   December 20 2022- Per NCCN guidelines recommended adjuvant Pembrolizumab  January 03 2023- Cycle 1 Keytruda  Jan 23 2023- Slight skin rash which is being managed with low potency topical steroids  Jan 25 2023- Cycle 2 Keytruda  Feb 06 2023- Labs to evaluate for possible autoimmune symptoms from [facility] notable only for mild elevation in CRP.    Feb 14 2023- Reviewed results  of labs with patient.  Suspect that some of sx are due to inadequately controlled DM Type II and recent bout of asthma/bronchitis  Feb 15 2023- Cycle 3 Keytruda  March 08 2023- Cycle 4 Keytruda  March 29, 2023:  Cycle 5 Keytruda   April 17 2023- Arrange for follow up whole body PET/CT  April 19 2023- Cycle 6 Keytruda   May 08 2023- Results of excision bx performed by dermatology pending.  PET/CT denied by insurance company  May 10 2023- Cycle 7 Keytruda  New skin lesions  April 26 2023- To undergo excision bx of skin lesion on back.  Will obtain path reports.    Fatigue  May 08 2023-  Multifactorial etiology with likely causes being 1) Keytruda 2) poorly controlled DM type II 3) obesity 4) depression  Discussed these concerns with patient's pcp  Vivid dreams  Feb 06 2023- Began after starting Chantix for smoking cessation.  This is a known side effect.    Poor venous access:     December 22 2022- Portacath placed  Post operative seroma-  December 20 2022- Mild and should resolve with conservative management.  A common problem with inguinal lymphadenectomy.  Other risk factors are obesity, DM Type II and tobacco use  Jan 23 2023- Resolved with conservative management    Cancer Staging  Malignant melanoma of right lower leg [facility]) Staging form: Melanoma of the Skin, AJCC 8th Edition - Clinical stage from 12/12/2022: Stage III (cT4b, cN1a, cM0) - Signed by [name], MD on 12/12/2022 Histopathologic type: Nodular melanoma Stage prefix: Initial diagnosis    No problem-specific Assessment & Plan notes found for this encounter.    No orders of the defined types were placed in this encounter.   30  minutes was spent in patient care.  This included time spent preparing to see the patient (e.g., review of tests), obtaining and/or reviewing separately obtained history, counseling and educating the patient/family/caregiver, ordering tests, or procedures; documenting clinical information in the electronic or other health record, independently interpreting results and communicating results to the patient/family/caregiver as well as coordination of care.       All questions were answered. The patient knows to call the clinic with any problems, questions or concerns.  This note was electronically signed.    [name], MD  05/29/2023 2:52 PM

## 2023-05-30 ENCOUNTER — Encounter: Payer: Self-pay | Admitting: Oncology

## 2023-05-31 ENCOUNTER — Inpatient Hospital Stay: Payer: 59

## 2023-05-31 VITALS — BP 162/74 | HR 90 | Temp 98.0°F | Resp 18 | Wt 306.0 lb

## 2023-05-31 DIAGNOSIS — C4371 Malignant melanoma of right lower limb, including hip: Secondary | ICD-10-CM | POA: Diagnosis not present

## 2023-05-31 DIAGNOSIS — Z5112 Encounter for antineoplastic immunotherapy: Secondary | ICD-10-CM | POA: Diagnosis not present

## 2023-05-31 MED ORDER — SODIUM CHLORIDE 0.9 % IV SOLN
200.0000 mg | Freq: Once | INTRAVENOUS | Status: AC
  Administered 2023-05-31: 200 mg via INTRAVENOUS
  Filled 2023-05-31: qty 8

## 2023-05-31 MED ORDER — SODIUM CHLORIDE 0.9% FLUSH: 10.0000 mL | INTRAVENOUS | Status: DC | PRN

## 2023-05-31 MED ORDER — HEPARIN SOD (PORK) LOCK FLUSH 100 UNIT/ML IV SOLN: 500.0000 [IU] | Freq: Once | INTRAVENOUS | Status: DC | PRN

## 2023-05-31 MED ORDER — SODIUM CHLORIDE 0.9 % IV SOLN: Freq: Once | INTRAVENOUS | Status: AC

## 2023-05-31 NOTE — Patient Instructions (Signed)

## 2023-06-05 DIAGNOSIS — K620 Anal polyp: Secondary | ICD-10-CM | POA: Diagnosis not present

## 2023-06-07 DIAGNOSIS — L578 Other skin changes due to chronic exposure to nonionizing radiation: Secondary | ICD-10-CM | POA: Diagnosis not present

## 2023-06-07 DIAGNOSIS — Z6841 Body Mass Index (BMI) 40.0 and over, adult: Secondary | ICD-10-CM | POA: Diagnosis not present

## 2023-06-07 DIAGNOSIS — E1165 Type 2 diabetes mellitus with hyperglycemia: Secondary | ICD-10-CM | POA: Diagnosis not present

## 2023-06-07 DIAGNOSIS — L820 Inflamed seborrheic keratosis: Secondary | ICD-10-CM | POA: Diagnosis not present

## 2023-06-07 DIAGNOSIS — L814 Other melanin hyperpigmentation: Secondary | ICD-10-CM | POA: Diagnosis not present

## 2023-06-07 DIAGNOSIS — L301 Dyshidrosis [pompholyx]: Secondary | ICD-10-CM | POA: Diagnosis not present

## 2023-06-07 DIAGNOSIS — F331 Major depressive disorder, recurrent, moderate: Secondary | ICD-10-CM | POA: Diagnosis not present

## 2023-06-19 ENCOUNTER — Inpatient Hospital Stay: Payer: 59

## 2023-06-19 ENCOUNTER — Inpatient Hospital Stay (HOSPITAL_BASED_OUTPATIENT_CLINIC_OR_DEPARTMENT_OTHER): Payer: 59 | Admitting: Oncology

## 2023-06-19 DIAGNOSIS — C4371 Malignant melanoma of right lower limb, including hip: Secondary | ICD-10-CM

## 2023-06-19 DIAGNOSIS — Z5112 Encounter for antineoplastic immunotherapy: Secondary | ICD-10-CM

## 2023-06-19 DIAGNOSIS — L270 Generalized skin eruption due to drugs and medicaments taken internally: Secondary | ICD-10-CM | POA: Diagnosis not present

## 2023-06-19 DIAGNOSIS — E1165 Type 2 diabetes mellitus with hyperglycemia: Secondary | ICD-10-CM

## 2023-06-19 LAB — CMP (CANCER CENTER ONLY)
ALT: 20 U/L (ref 0–44)
AST: 21 U/L (ref 15–41)
Albumin: 3.7 g/dL (ref 3.5–5.0)
Alkaline Phosphatase: 110 U/L (ref 38–126)
Anion gap: 12 (ref 5–15)
BUN: 11 mg/dL (ref 6–20)
CO2: 23 mmol/L (ref 22–32)
Calcium: 9.3 mg/dL (ref 8.9–10.3)
Chloride: 103 mmol/L (ref 98–111)
Creatinine: 0.95 mg/dL (ref 0.44–1.00)
GFR, Estimated: >60 mL/min (ref >60)
Glucose, Bld: 126 mg/dL — ABNORMAL HIGH (ref 70–99)
Potassium: 3.6 mmol/L (ref 3.5–5.1)
Sodium: 138 mmol/L (ref 135–145)
Total Bilirubin: 0.5 mg/dL (ref 0.3–1.2)
Total Protein: 7.2 g/dL (ref 6.5–8.1)

## 2023-06-19 LAB — CBC WITH DIFFERENTIAL (CANCER CENTER ONLY)
Abs Immature Granulocytes: 0.04 10*3/uL (ref 0.00–0.07)
Basophils Absolute: 0.1 10*3/uL (ref 0.0–0.1)
Basophils Relative: 1 %
Eosinophils Absolute: 0.5 10*3/uL (ref 0.0–0.5)
Eosinophils Relative: 4 %
HCT: 40.1 % (ref 36.0–46.0)
Hemoglobin: 13.1 g/dL (ref 12.0–15.0)
Immature Granulocytes: 0 %
Lymphocytes Relative: 21 %
Lymphs Abs: 2.3 10*3/uL (ref 0.7–4.0)
MCH: 28.3 pg (ref 26.0–34.0)
MCHC: 32.7 g/dL (ref 30.0–36.0)
MCV: 86.6 fL (ref 80.0–100.0)
Monocytes Absolute: 0.9 10*3/uL (ref 0.1–1.0)
Monocytes Relative: 8 %
Neutro Abs: 7.2 10*3/uL (ref 1.7–7.7)
Neutrophils Relative %: 66 %
Platelet Count: 306 10*3/uL (ref 150–400)
RBC: 4.63 MIL/uL (ref 3.87–5.11)
RDW: 13.6 % (ref 11.5–15.5)
WBC Count: 11 10*3/uL — ABNORMAL HIGH (ref 4.0–10.5)
nRBC: 0 % (ref 0.0–0.2)

## 2023-06-19 LAB — TSH: TSH: 3.496 u[IU]/mL (ref 0.350–4.500)

## 2023-06-19 MED ORDER — PREDNISONE 10 MG PO TABS: 10.0000 mg | ORAL_TABLET | Freq: Every day | ORAL | 1 refills | Status: DC

## 2023-06-19 NOTE — Progress Notes (Signed)
[facility] Center Cancer Initial Visit:  Patient Care Team: [name], [name], MD as PCP - General (Family Medicine)  CHIEF COMPLAINTS/PURPOSE OF CONSULTATION:  Oncology History  Malignant melanoma of right lower leg (HCC)  11/15/2022 Initial Diagnosis   Malignant melanoma of right lower leg (HCC)   12/12/2022 Cancer Staging   Staging form: Melanoma of the Skin, AJCC 8th Edition - Clinical stage from 12/12/2022: Stage III (cT4b, cN1a, cM0) - Signed by [name], MD on 12/12/2022 Histopathologic type: Nodular melanoma Stage prefix: Initial diagnosis   01/03/2023 -  Chemotherapy   Patient is on Treatment Plan : MELANOMA Pembrolizumab (200) q21d       HISTORY OF PRESENTING ILLNESS: Diana Odom 47 y.o. female is here because of  malignant melanoma Medical history notable for tobacco use, diabetes mellitus type 2, hypertension, OSA on CPAP  October 25, 2022: Presented to dermatology with a red, raised, nodular lesion on right anterior shin which have been present for over a year and a half.  It began as a dark mole and had grown slowly over the period of time.  Patient underwent excisional biopsy followed by electrodesiccation and curettage Pathology demonstrated malignant melanoma at least 5 mm thick.  Deep margin was involved ulceration present mitotic index 5/mm.  Tumor infiltrating lymphocyte not brisk  Patient was subsequently referred to surgery for additional management  November 23, 2022: Wide local excision of malignant melanoma with resulting 6 cm diameter circular wound; right inguinal sentinel lymph node biopsy Pathology from the excisional biopsy site demonstrated residual melanoma in situ, no residual invasive disease, LVI invasion seen, margins free of malignancy.  No change in stage from T4b.  Sentinel lymph node positive for melanoma (3 separate aggregate blocks) no extracapsular extension Pathologic stage IIIC (T4b, N1 M0)  December 12 2022:  [facility] Consult  States that her FSGB's run from 185 to 215  Feels down and depressed.  Eating a lot due to her emotions.    Social:  Divorced.  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   Quit smoking since her surgery in March 2024.  Formerly smoked 1 ppd.  EtOH drinks once every few weeks.    [facility] Mother died 55 CHF Father died late 16's CHF and COPD Sister alive 86 well Brother alive 58 CAD, DM Type II, COPD   December 16 2022:  PET/CT (Melanoma protocol) No evidence of residual melanoma or metastatic melanoma on whole-body scan.  Postsurgical seroma in the RIGHT inguinal region without  hypermetabolic lymph nodes in the RIGHT groin. Mild metabolic activity associated wide local excisions surgical site RIGHT lower extremity. Two small pulmonary nodules are favored un-related to melanoma.   MRI brain No evidence of intracranial metastatic disease.   December 20 2022:   Reviewed results of imaging and labs with patient and sister.  Discussed role of adjuvant immunotherapy.  Discussed risks and benefits of this.    January 03 2023:  Cycle 1 Keytruda  January 05 2023:  Has tolerated it well.  No pruritus, rash, nausea or emesis.  Provided reassuance  January 11 2023:  Port placed.    Jan 23, 2023:   Has lost 5 lbs.  Recovering from a bout of diarrhea from the weekend of March 26th; helped by imodium.  Appetite better but overall diminished.  Slight erythematous rash on wrists, resolving.  Slight nausea, no emesis.  Sentinel LN bx site healing and no longer draining.   CMP notable for  sodium 131  Glucose 260  Jan 25, 2023;  Cycle 2 Keytruda  Feb 06 2023:  Scheduled follow-up for melanoma.  Has lost 5 lbs.   States that she did not feel right after receiving last dose of Keytruda; light headed, fatigued, stomach upset (nausea/diarrhea).  Was fine the next day and did well until 3 days ago when developed GI symptoms, SOB and generalized myalgias.  Not smoking.  Using CPAP.  Has a wheeze.  No  pleuritic chest pain. Started Chantix about a month ago.  Having vivid dreams.   Glucose 215.  CK 40.  CRP 2.3 sed rate 17.  Plasma cortisol 4.6.  TSH 0.879.  Free T41.19.  ANA panel negative.  Rheumatoid factor negative  Feb 14, 2023:  Reviewed results of labs with patient.  Feels OK.  Has gained 2 lbs.  Last week was placed on Prednisone and Z pack for bronchitis. Not wheezing as badly since she was treated.  She has seasonal allergies.  Remains a non-smoker.    Feb 15 2923:  Cycle 3 Keytruda   March 08, 2023:  Cycle 4 Keytruda   March 27, 2023:    Has lost 7 lbs.  Had chest heaviness and wheezing with last cycle.  Was placed on Singular and prednisone with the latter causing hyperglycemia requiring institution of insulin.  Now in range 70% compared with 10% of the time.  Wearing a continuous glucose monitor.    March 29, 2023:  Cycle 5 Keytruda   April 17 2023:  Feeling well.  Gained 5 lbs.  FSBG's averaging 160 which is better than in the past.    April 19 2023:  Cycle 6 Keytruda  April 21 2023:  Colonoscopy- Pedunculated internal hemorrhoid.    April 26 2023: Excision biopsy of skin lesion on back at [facility].    May 08 2023:  PET/CT not performed- denied by insurance.  Results of excision bx by Dermatology pending.  Struggling with fatigue which makes it difficult for her to get up in the AM and go to work.  FSBG's averaging 172 for the last 14 days; in range 65%.  States she is doing fine with CPAP.  Discussed fatigue with PCP.  Several people have tested positive for COVID in her workplace; patient has tested negative as late as this AM.    May 10 2023:  Cycle 7 Keytruda  May 29 2023:    Underwent colonoscopy since last visit with removal of a polyp.  Has gained 10 lbs since last visit.  FSBG's were out of control; part of this was due to holding medication perioperatively.    May 31 2023:  Cycle 8 Keytruda  June 19, 2023:  Scheduled follow-up for  management of malignant melanoma.  States that glycemic control has improved with 14 day average of 137.  Has generalized rash which is making her miserable-- wakes her up from sleep.  She is to see her gynecologist on August 02 2023.    Will begin prednisone 10 mg daily which we can increase to 20 mg daily.   Wrote for PET/CT  June 21, 2023: Cycle 9 Keytruda  Review of Systems  Constitutional:  Positive for fatigue. Negative for appetite change, chills and fever.       Has been gaining weight due to increased eating  HENT:   Negative for nosebleeds and voice change.        Occasional sores on tongue and sore throat.  Occasional trouble swallowing.  Eyes:  Negative for icterus.       Vision worsened since diagnosed with DM.  Has not seen opthalmologist  Respiratory:  Negative for chest tightness, cough, hemoptysis, shortness of breath and wheezing.        PND:  none Orthopnea:  none DOE:    Cardiovascular:  Positive for palpitations. Negative for chest pain and leg swelling.       PND:  none Orthopnea:  none  Gastrointestinal:  Negative for abdominal pain, blood in stool, constipation, nausea and vomiting.       Some diarrhea due to metformen   Endocrine: Negative for hot flashes.       Heat intolerance  Genitourinary:  Negative for bladder incontinence, difficulty urinating, dysuria, frequency, hematuria and nocturia.   Musculoskeletal:  Negative for arthralgias, back pain, gait problem, myalgias, neck pain and neck stiffness.  Skin:  Negative for itching, rash and wound.  Neurological:  Negative for dizziness, extremity weakness, gait problem, light-headedness, numbness and speech difficulty.       Has some stress headaches  Hematological:  Negative for adenopathy. Bruises/bleeds easily.  Psychiatric/Behavioral:  Positive for depression. Negative for suicidal ideas. The patient is not nervous/anxious.     MEDICAL HISTORY: Past Medical History:  Diagnosis Date   Cancer (HCC)     Depression    Diabetes mellitus without complication (HCC)    Hypertension    Melanoma (HCC)    Sleep apnea     SURGICAL HISTORY: Past Surgical History:  Procedure Laterality Date   CHOLECYSTECTOMY     MELANOMA EXCISION WITH SENTINEL LYMPH NODE BIOPSY      SOCIAL HISTORY: Social History   Socioeconomic History   Marital status: Divorced    Spouse name: Not on file   Number of children: 2   Years of education: 12   Highest education level: 12th grade  Occupational History    Comment: Print production planner  Tobacco Use   Smoking status: Former    Current packs/day: 0.00    Average packs/day: 0.5 packs/day for 30.0 years (15.0 ttl pk-yrs)    Types: Cigarettes    Start date: 11/22/1992    Quit date: 11/23/2022    Years since quitting: 0.5   Smokeless tobacco: Not on file  Vaping Use   Vaping status: Some Days   Substances: CBD   Devices: CBD vape occasionally  Substance and Sexual Activity   Alcohol use: Never   Drug use: No    Types: Marijuana    Comment: former marijuana smoker   Sexual activity: Not Currently  Other Topics Concern   Not on file  Social History Narrative   Not on file   Social Determinants of Health   Financial Resource Strain: Medium Risk (12/28/2022)   Overall Financial Resource Strain (CARDIA)    Difficulty of Paying Living Expenses: Somewhat hard  Food Insecurity: Low Risk  (03/06/2023)   Received from [facility]   Hunger Vital Sign    Worried About Running Out of Food in the Last Year: Never true    Ran Out of Food in the Last Year: Never true  Transportation Needs: Not on file (03/06/2023)  Physical Activity: Inactive (12/28/2022)   Exercise Vital Sign    Days of Exercise per Week: 0 days    Minutes of Exercise per Session: 0 min  Stress: Stress Concern Present (12/28/2022)   [employer] of Occupational Health - Occupational Stress Questionnaire    Feeling of Stress : Rather much  Social Connections:  Moderately Isolated (12/28/2022)    Social Connection and Isolation Panel [NHANES]    Frequency of Communication with Friends and Family: More than three times a week    Frequency of Social Gatherings with Friends and Family: More than three times a week    Attends Religious Services: 1 to 4 times per year    Active Member of [employer] or Organizations: No    Attends Banker Meetings: Never    Marital Status: Divorced  Catering manager Violence: Not At Risk (12/12/2022)   Humiliation, Afraid, Rape, and Kick questionnaire    Fear of Current or Ex-Partner: No    Emotionally Abused: No    Physically Abused: No    Sexually Abused: No    FAMILY HISTORY No family history on file.  ALLERGIES:  has No Known Allergies.  MEDICATIONS:  Current Outpatient Medications  Medication Sig Dispense Refill   albuterol (VENTOLIN HFA) 108 (90 Base) MCG/ACT inhaler Inhale 2 puffs into the lungs every 6 (six) hours as needed for wheezing or shortness of breath. 8 g 2   atorvastatin (LIPITOR) 10 MG tablet Take 10 mg by mouth at bedtime.     FLUoxetine (PROZAC) 40 MG capsule Take 40 mg by mouth daily.     hydrochlorothiazide (HYDRODIURIL) 25 MG tablet Take by mouth.     hydrOXYzine (ATARAX) 25 MG tablet Take 25 mg by mouth 2 (two) times daily.     lidocaine-prilocaine (EMLA) cream Apply to affected area once 30 g 3   lisinopril (ZESTRIL) 20 MG tablet Take 1 tablet by mouth daily.     metFORMIN (GLUCOPHAGE) 1000 MG tablet Take 1 tablet (1,000 mg total) by mouth 2 (two) times daily with a meal. 60 tablet 1   metoprolol succinate (TOPROL-XL) 25 MG 24 hr tablet Take by mouth.     montelukast (SINGULAIR) 10 MG tablet Take 10 mg by mouth daily.     NOVOLOG FLEXPEN 100 UNIT/ML FlexPen Inject into the skin as directed. Sliding scale     ondansetron (ZOFRAN) 8 MG tablet Take 1 tablet (8 mg total) by mouth every 8 (eight) hours as needed for nausea or vomiting. 30 tablet 1   potassium chloride (MICRO-K) 10 MEQ CR capsule Take 10 mEq by mouth  2 (two) times daily.     prochlorperazine (COMPAZINE) 10 MG tablet Take 1 tablet (10 mg total) by mouth every 6 (six) hours as needed for nausea or vomiting. 30 tablet 1   TRESIBA FLEXTOUCH 100 UNIT/ML FlexTouch Pen Inject 10 Units into the skin at bedtime.     TRULICITY 1.5 MG/0.5ML SOPN Inject 1.5 mg into the skin once a week.     varenicline (CHANTIX) 1 MG tablet Take 1 mg by mouth 2 (two) times daily.     No current facility-administered medications for this visit.    PHYSICAL EXAMINATION:  ECOG PERFORMANCE STATUS: 0 - Asymptomatic   There were no vitals filed for this visit.    There were no vitals filed for this visit.     Physical Exam Vitals and nursing note reviewed.  Constitutional:      Appearance: Normal appearance. She is obese. She is not toxic-appearing or diaphoretic.     Comments: Here alone.    HENT:     Head: Normocephalic and atraumatic.     Right Ear: External ear normal.     Left Ear: External ear normal.     Nose: Nose normal. No congestion or rhinorrhea.  Eyes:  General: No scleral icterus.    Extraocular Movements: Extraocular movements intact.     Conjunctiva/sclera: Conjunctivae normal.     Pupils: Pupils are equal, round, and reactive to light.  Cardiovascular:     Rate and Rhythm: Normal rate.     Heart sounds: No murmur heard.    No friction rub. No gallop.  Abdominal:     General: Bowel sounds are normal.     Palpations: Abdomen is soft.  Musculoskeletal:        General: No swelling, tenderness or deformity.     Cervical back: Normal range of motion and neck supple. No rigidity or tenderness.  Lymphadenopathy:     Head:     Right side of head: No submental, submandibular, tonsillar, preauricular, posterior auricular or occipital adenopathy.     Left side of head: No submental, submandibular, tonsillar, preauricular, posterior auricular or occipital adenopathy.     Cervical: No cervical adenopathy.     Right cervical: No  superficial, deep or posterior cervical adenopathy.    Left cervical: No superficial, deep or posterior cervical adenopathy.     Upper Body:     Right upper body: No supraclavicular, axillary, pectoral or epitrochlear adenopathy.     Left upper body: No supraclavicular, axillary, pectoral or epitrochlear adenopathy.  Skin:    General: Skin is warm and dry.     Coloration: Skin is not jaundiced.     Findings: Rash present.     Comments: Flushing of cheeks  Neurological:     General: No focal deficit present.     Mental Status: She is alert and oriented to person, place, and time. Mental status is at baseline.     Cranial Nerves: No cranial nerve deficit.  Psychiatric:        Mood and Affect: Mood normal.        Behavior: Behavior normal.        Thought Content: Thought content normal.        Judgment: Judgment normal.     Comments: Anxious     LABORATORY DATA: I have personally reviewed the data as listed:  Appointment on 05/29/2023  Component Date Value Ref Range Status   WBC Count 05/29/2023 12.1 (H)  4.0 - 10.5 K/uL Final   RBC 05/29/2023 4.56  3.87 - 5.11 MIL/uL Final   Hemoglobin 05/29/2023 12.8  12.0 - 15.0 g/dL Final   HCT 16/06/9603 38.8  36.0 - 46.0 % Final   MCV 05/29/2023 85.1  80.0 - 100.0 fL Final   MCH 05/29/2023 28.1  26.0 - 34.0 pg Final   MCHC 05/29/2023 33.0  30.0 - 36.0 g/dL Final   RDW 54/05/8118 13.3  11.5 - 15.5 % Final   Platelet Count 05/29/2023 294  150 - 400 K/uL Final   nRBC 05/29/2023 0.0  0.0 - 0.2 % Final   Neutrophils Relative % 05/29/2023 71  % Final   Neutro Abs 05/29/2023 8.7 (H)  1.7 - 7.7 K/uL Final   Lymphocytes Relative 05/29/2023 19  % Final   Lymphs Abs 05/29/2023 2.3  0.7 - 4.0 K/uL Final   Monocytes Relative 05/29/2023 6  % Final   Monocytes Absolute 05/29/2023 0.7  0.1 - 1.0 K/uL Final   Eosinophils Relative 05/29/2023 2  % Final   Eosinophils Absolute 05/29/2023 0.2  0.0 - 0.5 K/uL Final   Basophils Relative 05/29/2023 1  %  Final   Basophils Absolute 05/29/2023 0.1  0.0 - 0.1 K/uL Final   Immature  Granulocytes 05/29/2023 1  % Final   Abs Immature Granulocytes 05/29/2023 0.07  0.00 - 0.07 K/uL Final   Performed at [facility], [address], [place], [place] [address]   Sodium 05/29/2023 136  135 - 145 mmol/L Final   Potassium 05/29/2023 3.5  3.5 - 5.1 mmol/L Final   Chloride 05/29/2023 101  98 - 111 mmol/L Final   CO2 05/29/2023 23  22 - 32 mmol/L Final   Glucose, Bld 05/29/2023 149 (H)  70 - 99 mg/dL Final   Glucose reference range applies only to samples taken after fasting for at least 8 hours.   BUN 05/29/2023 15  6 - 20 mg/dL Final   Creatinine 36/64/4034 0.65  0.44 - 1.00 mg/dL Final   Calcium 74/25/9563 9.4  8.9 - 10.3 mg/dL Final   Total Protein 87/56/4332 7.0  6.5 - 8.1 g/dL Final   Albumin 95/18/8416 3.6  3.5 - 5.0 g/dL Final   AST 60/63/0160 20  15 - 41 U/L Final   ALT 05/29/2023 27  0 - 44 U/L Final   Alkaline Phosphatase 05/29/2023 97  38 - 126 U/L Final   Total Bilirubin 05/29/2023 0.6  0.3 - 1.2 mg/dL Final   GFR, Estimated 05/29/2023 >60  >60 mL/min Final   Comment: (NOTE) Calculated using the CKD-EPI Creatinine Equation (2021)    Anion gap 05/29/2023 12  5 - 15 Final   Performed at [facility], [address], [place], [place] [address]   Free T4 05/29/2023 0.87  0.61 - 1.12 ng/dL Final   Comment: (NOTE) Biotin ingestion may interfere with free T4 tests. If the results are inconsistent with the TSH level, previous test results, or the clinical presentation, then consider biotin interference. If needed, order repeat testing after stopping biotin. Performed at [facility], [address] [place], [place] [address]    TSH 05/29/2023 1.382  0.350 - 4.500 uIU/mL Final   Comment: Performed by a 3rd Generation assay with a functional sensitivity of <=0.01 uIU/mL. Performed at [facility], [address], [place],  [place] [address]   Office Visit on 05/29/2023  Component Date Value Ref Range Status   hCG, Beta Chain, Quant, S 05/29/2023 <1  <5 mIU/mL Final   Comment:          GEST. AGE      CONC.  (mIU/mL)   <=1 WEEK        5 - 50     2 WEEKS       50 - 500     3 WEEKS       100 - 10,000     4 WEEKS     1,000 - 30,000     5 WEEKS     3,500 - 115,000   6-8 WEEKS     12,000 - 270,000    12 WEEKS     15,000 - 220,000        FEMALE AND NON-PREGNANT FEMALE:     LESS THAN 5 mIU/mL Performed at [facility], [address] [place], [place] [address]     RADIOGRAPHIC STUDIES: I have personally reviewed the radiological images as listed and agree with the findings in the report  No results found.  ASSESSMENT/PLAN 47 y.o. female is here because of  malignant melanoma.  Medical history notable for tobacco use, diabetes mellitus type 2, hypertension, OSA on CPAP  Malignant melanoma distal right leg, Stage IIIC (  T4b, N1 M0):  October 25 2022- Presented with ulcerated lesion of 18 months duration involving right anterior shin,.  Biopsied November 23 2022- Wide local excision and sentinel LN bx.  (Patient did not have clinically apparent LN's by history, nor in transit disease) December 12 2022- Obtain BRAF mutation studies on surgical material December 16 2022 -PET/CT whole body.  No residual melanoma or metastatic disease.  Two small pulmonary nodules thought unrelated. MRI brain negative  Therapeutics   December 12 2022- Discussed use of adjuvant therapy to prevent recurrence.   December 20 2022- Per NCCN guidelines recommended adjuvant Pembrolizumab  January 03 2023- Cycle 1 Keytruda  Jan 23 2023- Slight skin rash which is being managed with low potency topical steroids  Jan 25 2023- Cycle 2 Keytruda  Feb 06 2023- Labs to evaluate for possible autoimmune symptoms from [facility] notable only for mild elevation in CRP.    Feb 14 2023- Reviewed results of labs with patient.  Suspect that some of sx are due to  inadequately controlled DM Type II and recent bout of asthma/bronchitis  Feb 15 2023- Cycle 3 Keytruda  March 08 2023- Cycle 4 Keytruda  March 29, 2023:  Cycle 5 Keytruda   April 17 2023- Arrange for follow up whole body PET/CT  April 19 2023- Cycle 6 Keytruda   May 08 2023- Results of excision bx performed by dermatology pending.  PET/CT denied by insurance company  May 10 2023- Cycle 7 Keytruda  May 31 2023:  Cycle 8 Keytruda  June 19 2023- To start Prednisone 10 mg daily and titrate upward as needed for rash due to checkpoint inhibitor.  Steroids can exacerbate hyperglycemia from DM Type II Wrote for follow up PET/CT    June 21, 2023: Cycle 9 Keytruda   New skin lesions  April 26 2023- To undergo excision bx of skin lesion on back.  Will obtain path reports.    Fatigue  May 08 2023- Multifactorial etiology with likely causes being 1) Keytruda 2) poorly controlled DM type II 3) obesity 4) depression  Discussed these concerns with patient's pcp  May 29 2023- DM Type II remains poorly controlled  Vivid dreams  Feb 06 2023- Began after starting Chantix for smoking cessation.  This is a known side effect.    Poor venous access:     December 22 2022- Portacath placed  Post operative seroma-  December 20 2022- Mild and should resolve with conservative management.  A common problem with inguinal lymphadenectomy.  Other risk factors are obesity, DM Type II and tobacco use  Jan 23 2023- Resolved with conservative management    Cancer Staging  Malignant melanoma of right lower leg [facility]) Staging form: Melanoma of the Skin, AJCC 8th Edition - Clinical stage from 12/12/2022: Stage III (cT4b, cN1a, cM0) - Signed by [name], MD on 12/12/2022 Histopathologic type: Nodular melanoma Stage prefix: Initial diagnosis    No problem-specific Assessment & Plan notes found for this encounter.    No orders of the defined types were placed in this encounter.   30   minutes was spent in patient care.  This included time spent preparing to see the patient (e.g., review of tests), obtaining and/or reviewing separately obtained history, counseling and educating the patient/family/caregiver, ordering tests, or procedures; documenting clinical information in the electronic or other health record, independently interpreting results and communicating results to the patient/family/caregiver as well as coordination of care.       All questions were answered.  The patient knows to call the clinic with any problems, questions or concerns.  This note was electronically signed.    [name], MD  06/19/2023 1:04 PM

## 2023-06-20 ENCOUNTER — Encounter: Payer: Self-pay | Admitting: Oncology

## 2023-06-20 LAB — T4: T4, Total: 7.4 ug/dL (ref 4.5–12.0)

## 2023-06-20 MED FILL — Pembrolizumab IV Soln 100 MG/4ML (25 MG/ML): INTRAVENOUS | Qty: 8 | Status: AC

## 2023-06-21 ENCOUNTER — Inpatient Hospital Stay: Payer: 59 | Attending: Oncology

## 2023-06-21 ENCOUNTER — Other Ambulatory Visit: Payer: Self-pay

## 2023-06-21 VITALS — BP 151/70 | HR 88 | Temp 98.0°F | Resp 18 | Wt 305.0 lb

## 2023-06-21 DIAGNOSIS — Z452 Encounter for adjustment and management of vascular access device: Secondary | ICD-10-CM | POA: Diagnosis not present

## 2023-06-21 DIAGNOSIS — C4371 Malignant melanoma of right lower limb, including hip: Secondary | ICD-10-CM | POA: Insufficient documentation

## 2023-06-21 DIAGNOSIS — Z79899 Other long term (current) drug therapy: Secondary | ICD-10-CM | POA: Insufficient documentation

## 2023-06-21 DIAGNOSIS — Z5112 Encounter for antineoplastic immunotherapy: Secondary | ICD-10-CM | POA: Diagnosis not present

## 2023-06-21 MED ORDER — SODIUM CHLORIDE 0.9 % IV SOLN: Freq: Once | INTRAVENOUS | Status: AC

## 2023-06-21 MED ORDER — SODIUM CHLORIDE 0.9% FLUSH: 10.0000 mL | INTRAVENOUS | Status: DC | PRN

## 2023-06-21 MED ORDER — SODIUM CHLORIDE 0.9 % IV SOLN
200.0000 mg | Freq: Once | INTRAVENOUS | Status: AC
  Administered 2023-06-21: 200 mg via INTRAVENOUS
  Filled 2023-06-21: qty 8

## 2023-06-21 MED ORDER — HEPARIN SOD (PORK) LOCK FLUSH 100 UNIT/ML IV SOLN: 500.0000 [IU] | Freq: Once | INTRAVENOUS | Status: DC | PRN

## 2023-06-21 NOTE — Patient Instructions (Signed)

## 2023-06-26 DIAGNOSIS — Z9889 Other specified postprocedural states: Secondary | ICD-10-CM | POA: Diagnosis not present

## 2023-06-26 DIAGNOSIS — C4371 Malignant melanoma of right lower limb, including hip: Secondary | ICD-10-CM | POA: Diagnosis not present

## 2023-06-28 ENCOUNTER — Encounter: Payer: Self-pay | Admitting: Pulmonary Disease

## 2023-06-28 ENCOUNTER — Ambulatory Visit (INDEPENDENT_AMBULATORY_CARE_PROVIDER_SITE_OTHER): Payer: 59 | Admitting: Pulmonary Disease

## 2023-06-28 ENCOUNTER — Telehealth: Payer: Self-pay

## 2023-06-28 VITALS — BP 114/76 | HR 78 | Temp 98.0°F | Ht 65.0 in | Wt 309.8 lb

## 2023-06-28 DIAGNOSIS — G4733 Obstructive sleep apnea (adult) (pediatric): Secondary | ICD-10-CM

## 2023-06-28 NOTE — Progress Notes (Signed)
Diana Odom    [id number]    [birth year]  Primary Care Physician:Santiago London Sheer, MD  Referring Physician: Charlott Rakes, MD [address] [place],  [place] [address]  Chief complaint:   Patient being seen for obstructive sleep apnea  HPI:  History of obstructive sleep apnea with last sleep study about 2010 She does use CPAP on a regular basis at present  She was seen in May, a sleep study was ordered but have not been able to have it done so far  Current machine still works just very dated  Breathing has improved significantly since the last visit  She has been treated for stage III melanoma  Does have fatigue, shortness of breath on exertion Exercise tolerance about 100 yards  Reformed smoker quit in 2024  She thinks the current setting may be about a pressure of 11-12, not sure She feels refreshed when she wakes up in the morning No morning headaches Usually gets about 7 to 8 hours of sleep at night Gets up around 7 AM  History of hypertension is well-controlled No significant alcohol use  Reformed smoker  Occasional cough   Outpatient Encounter Medications as of 06/28/2023  Medication Sig   albuterol (VENTOLIN HFA) 108 (90 Base) MCG/ACT inhaler Inhale 2 puffs into the lungs every 6 (six) hours as needed for wheezing or shortness of breath.   atorvastatin (LIPITOR) 10 MG tablet Take 10 mg by mouth at bedtime.   FLUoxetine (PROZAC) 40 MG capsule Take 40 mg by mouth daily.   hydrochlorothiazide (HYDRODIURIL) 25 MG tablet Take by mouth.   hydrOXYzine (ATARAX) 25 MG tablet Take 25 mg by mouth 2 (two) times daily.   lidocaine-prilocaine (EMLA) cream Apply to affected area once   lisinopril (ZESTRIL) 20 MG tablet Take 1 tablet by mouth daily.   metFORMIN (GLUCOPHAGE) 1000 MG tablet Take 1 tablet (1,000 mg total) by mouth 2 (two) times daily with a meal.   metoprolol succinate (TOPROL-XL) 25 MG 24 hr tablet Take by mouth.    montelukast (SINGULAIR) 10 MG tablet Take 10 mg by mouth daily.   NOVOLOG FLEXPEN 100 UNIT/ML FlexPen Inject into the skin as directed. Sliding scale   ondansetron (ZOFRAN) 8 MG tablet Take 1 tablet (8 mg total) by mouth every 8 (eight) hours as needed for nausea or vomiting.   potassium chloride (MICRO-K) 10 MEQ CR capsule Take 10 mEq by mouth 2 (two) times daily.   predniSONE (DELTASONE) 10 MG tablet Take 1 tablet (10 mg total) by mouth daily with breakfast. (Patient taking differently: Take 30 mg by mouth daily with breakfast. Three tablets daily with breakfast)   prochlorperazine (COMPAZINE) 10 MG tablet Take 1 tablet (10 mg total) by mouth every 6 (six) hours as needed for nausea or vomiting.   Semaglutide (OZEMPIC, 0.25 OR 0.5 MG/DOSE, [place]) Inject into the skin.   TRESIBA FLEXTOUCH 100 UNIT/ML FlexTouch Pen Inject 10 Units into the skin at bedtime.   varenicline (CHANTIX) 1 MG tablet Take 1 mg by mouth 2 (two) times daily.   No facility-administered encounter medications on file as of 06/28/2023.    Allergies as of 06/28/2023   (No Known Allergies)    Past Medical History:  Diagnosis Date   Cancer (HCC)    Depression    Diabetes mellitus without complication (HCC)    Hypertension    Melanoma (HCC)    Sleep apnea     Past Surgical  History:  Procedure Laterality Date   CHOLECYSTECTOMY     MELANOMA EXCISION WITH SENTINEL LYMPH NODE BIOPSY      No family history on file.  Social History   Socioeconomic History   Marital status: Divorced    Spouse name: Not on file   Number of children: 2   Years of education: 12   Highest education level: 12th grade  Occupational History    Comment: Print production planner  Tobacco Use   Smoking status: Former    Current packs/day: 0.00    Average packs/day: 0.5 packs/day for 30.0 years (15.0 ttl pk-yrs)    Types: Cigarettes    Start date: 11/22/1992    Quit date: 11/23/2022    Years since quitting: 0.5   Smokeless tobacco: Not on file   Vaping Use   Vaping status: Some Days   Substances: CBD   Devices: CBD vape occasionally  Substance and Sexual Activity   Alcohol use: Never   Drug use: No    Types: Marijuana    Comment: former marijuana smoker   Sexual activity: Not Currently  Other Topics Concern   Not on file  Social History Narrative   Not on file   Social Determinants of Health   Financial Resource Strain: Medium Risk (12/28/2022)   Overall Financial Resource Strain (CARDIA)    Difficulty of Paying Living Expenses: Somewhat hard  Food Insecurity: Low Risk  (03/06/2023)   Received from [facility]   Hunger Vital Sign    Worried About Running Out of Food in the Last Year: Never true    Ran Out of Food in the Last Year: Never true  Transportation Needs: Not on file (03/06/2023)  Physical Activity: Inactive (12/28/2022)   Exercise Vital Sign    Days of Exercise per Week: 0 days    Minutes of Exercise per Session: 0 min  Stress: Stress Concern Present (12/28/2022)   [employer] of Occupational Health - Occupational Stress Questionnaire    Feeling of Stress : Rather much  Social Connections: Moderately Isolated (12/28/2022)   Social Connection and Isolation Panel [NHANES]    Frequency of Communication with Friends and Family: More than three times a week    Frequency of Social Gatherings with Friends and Family: More than three times a week    Attends Religious Services: 1 to 4 times per year    Active Member of [employer] or Organizations: No    Attends Banker Meetings: Never    Marital Status: Divorced  Catering manager Violence: Not At Risk (12/12/2022)   Humiliation, Afraid, Rape, and Kick questionnaire    Fear of Current or Ex-Partner: No    Emotionally Abused: No    Physically Abused: No    Sexually Abused: No    Review of Systems  Constitutional:  Positive for fatigue.  Respiratory:  Positive for apnea and shortness of breath.   Psychiatric/Behavioral:  Positive for sleep  disturbance.     Vitals:   06/28/23 1054  BP: 114/76  Pulse: 78  Temp: 98 F (36.7 C)  SpO2: 98%     Physical Exam Constitutional:      Appearance: She is obese.  HENT:     Head: Normocephalic.     Mouth/Throat:     Mouth: Mucous membranes are moist.  Eyes:     General: Scleral icterus present.  Cardiovascular:     Rate and Rhythm: Normal rate and regular rhythm.     Heart sounds: No murmur heard.  No friction rub.  Pulmonary:     Effort: No respiratory distress.     Breath sounds: No stridor. No wheezing or rhonchi.  Musculoskeletal:     Cervical back: No rigidity.  Neurological:     Mental Status: She is alert.  Psychiatric:        Mood and Affect: Mood normal.      Data Reviewed: Previous sleep study not available for review  Does not have a compliance card  Assessment:  Known obstructive sleep apnea -Uses CPAP nightly -Machine is dated  Stage III melanoma for which she is on immunotherapy  Class III obesity  Daytime fatigue  Plan/Recommendations: Plan for home sleep test  Continue using current machine  Graded activities as tolerated  Tentative follow-up in about 3 months   Virl Diamond MD [facility] 06/28/2023, 11:13 AM  CC: Charlott Rakes, MD

## 2023-06-28 NOTE — Patient Instructions (Signed)
Order will be placed for home sleep test  Will update you with results as soon as available and contacted medical supply company for set up for new CPAP  Continue using current machine  Good luck with your PET scan results  Tentative follow-up in 3 months

## 2023-06-28 NOTE — Telephone Encounter (Signed)
Left detailed message to increase Prednisone.

## 2023-06-28 NOTE — Telephone Encounter (Signed)
-----   Message from [name] sent at 06/28/2023  8:41 AM EDT ----- She can increase to 30 mg daily Thanks ----- Message ----- From: Arville Care, CMA Sent: 06/27/2023   1:00 PM EDT To: [name], MD  Patient called stating that the Prednisone 20 mg has been helping, but she continues to have itching and bleeding. She is wondering if she needs to go up on the dosage of prednisone?

## 2023-06-30 ENCOUNTER — Other Ambulatory Visit: Payer: Self-pay

## 2023-06-30 DIAGNOSIS — C4371 Malignant melanoma of right lower limb, including hip: Secondary | ICD-10-CM

## 2023-06-30 MED ORDER — PREDNISONE 10 MG PO TABS: 30.0000 mg | ORAL_TABLET | Freq: Every day | ORAL | 0 refills | Status: DC

## 2023-07-05 ENCOUNTER — Other Ambulatory Visit: Payer: Self-pay | Admitting: Oncology

## 2023-07-05 ENCOUNTER — Encounter: Payer: Self-pay | Admitting: Oncology

## 2023-07-05 DIAGNOSIS — C4371 Malignant melanoma of right lower limb, including hip: Secondary | ICD-10-CM

## 2023-07-10 ENCOUNTER — Inpatient Hospital Stay: Payer: 59

## 2023-07-10 ENCOUNTER — Inpatient Hospital Stay (HOSPITAL_BASED_OUTPATIENT_CLINIC_OR_DEPARTMENT_OTHER): Payer: 59 | Admitting: Oncology

## 2023-07-10 VITALS — BP 152/96 | HR 88 | Temp 98.8°F | Resp 18 | Ht 65.0 in | Wt 319.4 lb

## 2023-07-10 DIAGNOSIS — Z7952 Long term (current) use of systemic steroids: Secondary | ICD-10-CM

## 2023-07-10 DIAGNOSIS — Z08 Encounter for follow-up examination after completed treatment for malignant neoplasm: Secondary | ICD-10-CM

## 2023-07-10 DIAGNOSIS — Z5112 Encounter for antineoplastic immunotherapy: Secondary | ICD-10-CM

## 2023-07-10 DIAGNOSIS — C4371 Malignant melanoma of right lower limb, including hip: Secondary | ICD-10-CM

## 2023-07-10 DIAGNOSIS — Z79899 Other long term (current) drug therapy: Secondary | ICD-10-CM | POA: Diagnosis not present

## 2023-07-10 DIAGNOSIS — L270 Generalized skin eruption due to drugs and medicaments taken internally: Secondary | ICD-10-CM

## 2023-07-10 DIAGNOSIS — Z452 Encounter for adjustment and management of vascular access device: Secondary | ICD-10-CM | POA: Diagnosis not present

## 2023-07-10 LAB — CMP (CANCER CENTER ONLY)
ALT: 20 U/L (ref 0–44)
AST: 16 U/L (ref 15–41)
Albumin: 3.8 g/dL (ref 3.5–5.0)
Alkaline Phosphatase: 99 U/L (ref 38–126)
Anion gap: 10 (ref 5–15)
BUN: 18 mg/dL (ref 6–20)
CO2: 26 mmol/L (ref 22–32)
Calcium: 9.4 mg/dL (ref 8.9–10.3)
Chloride: 98 mmol/L (ref 98–111)
Creatinine: 0.88 mg/dL (ref 0.44–1.00)
GFR, Estimated: >60 mL/min (ref >60)
Glucose, Bld: 138 mg/dL — ABNORMAL HIGH (ref 70–99)
Potassium: 4.1 mmol/L (ref 3.5–5.1)
Sodium: 134 mmol/L — ABNORMAL LOW (ref 135–145)
Total Bilirubin: 0.3 mg/dL (ref 0.3–1.2)
Total Protein: 7.3 g/dL (ref 6.5–8.1)

## 2023-07-10 LAB — CBC WITH DIFFERENTIAL (CANCER CENTER ONLY)
Abs Immature Granulocytes: 0.23 10*3/uL — ABNORMAL HIGH (ref 0.00–0.07)
Basophils Absolute: 0.1 10*3/uL (ref 0.0–0.1)
Basophils Relative: 0 %
Eosinophils Absolute: 0 10*3/uL (ref 0.0–0.5)
Eosinophils Relative: 0 %
HCT: 41.9 % (ref 36.0–46.0)
Hemoglobin: 13.5 g/dL (ref 12.0–15.0)
Immature Granulocytes: 1 %
Lymphocytes Relative: 9 %
Lymphs Abs: 1.8 10*3/uL (ref 0.7–4.0)
MCH: 28.3 pg (ref 26.0–34.0)
MCHC: 32.2 g/dL (ref 30.0–36.0)
MCV: 87.8 fL (ref 80.0–100.0)
Monocytes Absolute: 1.1 10*3/uL — ABNORMAL HIGH (ref 0.1–1.0)
Monocytes Relative: 5 %
Neutro Abs: 17.3 10*3/uL — ABNORMAL HIGH (ref 1.7–7.7)
Neutrophils Relative %: 85 %
Platelet Count: 314 10*3/uL (ref 150–400)
RBC: 4.77 MIL/uL (ref 3.87–5.11)
RDW: 13.6 % (ref 11.5–15.5)
WBC Count: 20.5 10*3/uL — ABNORMAL HIGH (ref 4.0–10.5)
nRBC: 0 % (ref 0.0–0.2)

## 2023-07-10 LAB — TSH: TSH: 1.888 u[IU]/mL (ref 0.350–4.500)

## 2023-07-10 NOTE — Progress Notes (Signed)
[facility] Cancer Initial Visit:  Patient Care Team: [name], [name], MD as PCP - General (Family Medicine)  CHIEF COMPLAINTS/PURPOSE OF CONSULTATION:  Oncology History  Malignant melanoma of right lower leg (HCC)  11/15/2022 Initial Diagnosis   Malignant melanoma of right lower leg (HCC)   12/12/2022 Cancer Staging   Staging form: Melanoma of the Skin, AJCC 8th Edition - Clinical stage from 12/12/2022: Stage III (cT4b, cN1a, cM0) - Signed by [name], MD on 12/12/2022 Histopathologic type: Nodular melanoma Stage prefix: Initial diagnosis   01/03/2023 -  Chemotherapy   Patient is on Treatment Plan : MELANOMA Pembrolizumab (200) q21d       HISTORY OF PRESENTING ILLNESS: Diana Odom 47 y.o. female is here because of  malignant melanoma Medical history notable for tobacco use, diabetes mellitus type 2, hypertension, OSA on CPAP  October 25, 2022: Presented to dermatology with a red, raised, nodular lesion on right anterior shin which have been present for over a year and a half.  It began as a dark mole and had grown slowly over the period of time.  Patient underwent excisional biopsy followed by electrodesiccation and curettage Pathology demonstrated malignant melanoma at least 5 mm thick.  Deep margin was involved ulceration present mitotic index 5/mm.  Tumor infiltrating lymphocyte not brisk  Patient was subsequently referred to surgery for additional management  November 23, 2022: Wide local excision of malignant melanoma with resulting 6 cm diameter circular wound; right inguinal sentinel lymph node biopsy Pathology from the excisional biopsy site demonstrated residual melanoma in situ, no residual invasive disease, LVI invasion seen, margins free of malignancy.  No change in stage from T4b.  Sentinel lymph node positive for melanoma (3 separate aggregate blocks) no extracapsular extension Pathologic stage IIIC (T4b, N1 M0)  December 12 2022:  [facility]cology Consult  States that her FSGB's run from 185 to 215  Feels down and depressed.  Eating a lot due to her emotions.    Social:  Divorced.  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   Quit smoking since her surgery in March 2024.  Formerly smoked 1 ppd.  EtOH drinks once every few weeks.    [facility] Mother died 39 CHF Father died late 45's CHF and COPD Sister alive 10 well Brother alive 41 CAD, DM Type II, COPD   December 16 2022:  PET/CT (Melanoma protocol) No evidence of residual melanoma or metastatic melanoma on whole-body scan.  Postsurgical seroma in the RIGHT inguinal region without  hypermetabolic lymph nodes in the RIGHT groin. Mild metabolic activity associated wide local excisions surgical site RIGHT lower extremity. Two small pulmonary nodules are favored un-related to melanoma.   MRI brain No evidence of intracranial metastatic disease.   December 20 2022:   Reviewed results of imaging and labs with patient and sister.  Discussed role of adjuvant immunotherapy.  Discussed risks and benefits of this.    January 03 2023:  Cycle 1 Keytruda  January 05 2023:  Has tolerated it well.  No pruritus, rash, nausea or emesis.  Provided reassuance  January 11 2023:  Port placed.    Jan 23, 2023:   Has lost 5 lbs.  Recovering from a bout of diarrhea from the weekend of March 26th; helped by imodium.  Appetite better but overall diminished.  Slight erythematous rash on wrists, resolving.  Slight nausea, no emesis.  Sentinel LN bx site healing and no longer draining.   CMP notable for  sodium 131  Glucose 260  Jan 25, 2023;  Cycle 2 Keytruda  Feb 06 2023:  Scheduled follow-up for melanoma.  Has lost 5 lbs.   States that she did not feel right after receiving last dose of Keytruda; light headed, fatigued, stomach upset (nausea/diarrhea).  Was fine the next day and did well until 3 days ago when developed GI symptoms, SOB and generalized myalgias.  Not smoking.  Using CPAP.  Has a wheeze.  No  pleuritic chest pain. Started Chantix about a month ago.  Having vivid dreams.   Glucose 215.  CK 40.  CRP 2.3 sed rate 17.  Plasma cortisol 4.6.  TSH 0.879.  Free T41.19.  ANA panel negative.  Rheumatoid factor negative  Feb 14, 2023:  Reviewed results of labs with patient.  Feels OK.  Has gained 2 lbs.  Last week was placed on Prednisone and Z pack for bronchitis. Not wheezing as badly since she was treated.  She has seasonal allergies.  Remains a non-smoker.    Feb 15 2923:  Cycle 3 Keytruda   March 08, 2023:  Cycle 4 Keytruda   March 27, 2023:    Has lost 7 lbs.  Had chest heaviness and wheezing with last cycle.  Was placed on Singular and prednisone with the latter causing hyperglycemia requiring institution of insulin.  Now in range 70% compared with 10% of the time.  Wearing a continuous glucose monitor.    March 29, 2023:  Cycle 5 Keytruda   April 17 2023:  Feeling well.  Gained 5 lbs.  FSBG's averaging 160 which is better than in the past.    April 19 2023:  Cycle 6 Keytruda  April 21 2023:  Colonoscopy- Pedunculated internal hemorrhoid.    April 26 2023: Excision biopsy of skin lesion on back at [facility].    May 08 2023:  PET/CT not performed- denied by insurance.  Results of excision bx by Dermatology pending.  Struggling with fatigue which makes it difficult for her to get up in the AM and go to work.  FSBG's averaging 172 for the last 14 days; in range 65%.  States she is doing fine with CPAP.  Discussed fatigue with PCP.  Several people have tested positive for COVID in her workplace; patient has tested negative as late as this AM.    May 10 2023:  Cycle 7 Keytruda  May 29 2023:    Underwent colonoscopy since last visit with removal of a polyp.  Has gained 10 lbs since last visit.  FSBG's were out of control; part of this was due to holding medication perioperatively.    May 31 2023:  Cycle 8 Keytruda  June 19, 2023:    States that glycemic  control has improved with 14 day average of 137.  Has generalized rash which is making her miserable-- wakes her up from sleep.  She is to see her gynecologist on August 02 2023.    Will begin prednisone 10 mg daily which we can increase to 20 mg daily.   Wrote for PET/CT  June 21, 2023: Cycle 9 Keytruda  June 26 2023:  PET/CT No evidence of recurrent or metastatic disease.  Postprocedural changes in the right lower extremity and right groin.   July 10 2023:  Scheduled follow-up for management of malignant melanoma.  Currently on Prednisone 30 mg daily with adequate control of pruritus.  States that average glucose is 165.  Not sleeping well on Prednisone.   Reviewed  results of imaging labs with patient.    July 14 2023:  Cycle 10 Keytruda  Review of Systems  Constitutional:  Positive for fatigue. Negative for appetite change, chills and fever.       Has been gaining weight due to increased eating  HENT:   Negative for nosebleeds and voice change.        Occasional sores on tongue and sore throat.  Occasional trouble swallowing.   Eyes:  Negative for icterus.       Vision worsened since diagnosed with DM.  Has not seen opthalmologist  Respiratory:  Negative for chest tightness, cough, hemoptysis, shortness of breath and wheezing.        PND:  none Orthopnea:  none DOE:    Cardiovascular:  Positive for palpitations. Negative for chest pain and leg swelling.       PND:  none Orthopnea:  none  Gastrointestinal:  Negative for abdominal pain, blood in stool, constipation, nausea and vomiting.       Some diarrhea due to metformen   Endocrine: Negative for hot flashes.       Heat intolerance  Genitourinary:  Negative for bladder incontinence, difficulty urinating, dysuria, frequency, hematuria and nocturia.   Musculoskeletal:  Negative for arthralgias, back pain, gait problem, myalgias, neck pain and neck stiffness.  Skin:  Negative for itching, rash and wound.  Neurological:   Negative for dizziness, extremity weakness, gait problem, light-headedness, numbness and speech difficulty.       Has some stress headaches  Hematological:  Negative for adenopathy. Bruises/bleeds easily.  Psychiatric/Behavioral:  Positive for depression. Negative for suicidal ideas. The patient is not nervous/anxious.     MEDICAL HISTORY: Past Medical History:  Diagnosis Date   Cancer (HCC)    Depression    Diabetes mellitus without complication (HCC)    Hypertension    Melanoma (HCC)    Sleep apnea     SURGICAL HISTORY: Past Surgical History:  Procedure Laterality Date   CHOLECYSTECTOMY     MELANOMA EXCISION WITH SENTINEL LYMPH NODE BIOPSY      SOCIAL HISTORY: Social History   Socioeconomic History   Marital status: Divorced    Spouse name: Not on file   Number of children: 2   Years of education: 12   Highest education level: 12th grade  Occupational History    Comment: Print production planner  Tobacco Use   Smoking status: Former    Current packs/day: 0.00    Average packs/day: 0.5 packs/day for 30.0 years (15.0 ttl pk-yrs)    Types: Cigarettes    Start date: 11/22/1992    Quit date: 11/23/2022    Years since quitting: 0.6   Smokeless tobacco: Not on file  Vaping Use   Vaping status: Some Days   Substances: CBD   Devices: CBD vape occasionally  Substance and Sexual Activity   Alcohol use: Never   Drug use: No    Types: Marijuana    Comment: former marijuana smoker   Sexual activity: Not Currently  Other Topics Concern   Not on file  Social History Narrative   Not on file   Social Determinants of Health   Financial Resource Strain: Medium Risk (12/28/2022)   Overall Financial Resource Strain (CARDIA)    Difficulty of Paying Living Expenses: Somewhat hard  Food Insecurity: Low Risk  (03/06/2023)   Received from [facility]   Hunger Vital Sign    Worried About Running Out of Food in the Last Year: Never true  Ran Out of Food in the Last Year: Never true   Transportation Needs: Not on file (03/06/2023)  Physical Activity: Inactive (12/28/2022)   Exercise Vital Sign    Days of Exercise per Week: 0 days    Minutes of Exercise per Session: 0 min  Stress: Stress Concern Present (12/28/2022)   [employer] of Occupational Health - Occupational Stress Questionnaire    Feeling of Stress : Rather much  Social Connections: Moderately Isolated (12/28/2022)   Social Connection and Isolation Panel [NHANES]    Frequency of Communication with Friends and Family: More than three times a week    Frequency of Social Gatherings with Friends and Family: More than three times a week    Attends Religious Services: 1 to 4 times per year    Active Member of [employer] or Organizations: No    Attends Banker Meetings: Never    Marital Status: Divorced  Catering manager Violence: Not At Risk (12/12/2022)   Humiliation, Afraid, Rape, and Kick questionnaire    Fear of Current or Ex-Partner: No    Emotionally Abused: No    Physically Abused: No    Sexually Abused: No    FAMILY HISTORY No family history on file.  ALLERGIES:  has No Known Allergies.  MEDICATIONS:  Current Outpatient Medications  Medication Sig Dispense Refill   albuterol (VENTOLIN HFA) 108 (90 Base) MCG/ACT inhaler Inhale 2 puffs into the lungs every 6 (six) hours as needed for wheezing or shortness of breath. 8 g 2   atorvastatin (LIPITOR) 10 MG tablet Take 10 mg by mouth at bedtime.     FLUoxetine (PROZAC) 40 MG capsule Take 40 mg by mouth daily.     hydrochlorothiazide (HYDRODIURIL) 25 MG tablet Take by mouth.     hydrOXYzine (ATARAX) 25 MG tablet Take 25 mg by mouth 2 (two) times daily.     lidocaine-prilocaine (EMLA) cream Apply to affected area once 30 g 3   lisinopril (ZESTRIL) 20 MG tablet Take 1 tablet by mouth daily.     metFORMIN (GLUCOPHAGE) 1000 MG tablet Take 1 tablet (1,000 mg total) by mouth 2 (two) times daily with a meal. 60 tablet 1   metoprolol succinate  (TOPROL-XL) 25 MG 24 hr tablet Take by mouth.     montelukast (SINGULAIR) 10 MG tablet Take 10 mg by mouth daily.     NOVOLOG FLEXPEN 100 UNIT/ML FlexPen Inject into the skin as directed. Sliding scale     ondansetron (ZOFRAN) 8 MG tablet Take 1 tablet (8 mg total) by mouth every 8 (eight) hours as needed for nausea or vomiting. 30 tablet 1   potassium chloride (MICRO-K) 10 MEQ CR capsule Take 10 mEq by mouth 2 (two) times daily.     predniSONE (DELTASONE) 10 MG tablet Take 3 tablets (30 mg total) by mouth daily with breakfast. Three tablets daily with breakfast 90 tablet 0   prochlorperazine (COMPAZINE) 10 MG tablet Take 1 tablet (10 mg total) by mouth every 6 (six) hours as needed for nausea or vomiting. 30 tablet 1   Semaglutide (OZEMPIC, 0.25 OR 0.5 MG/DOSE, [place]) Inject into the skin.     TRESIBA FLEXTOUCH 100 UNIT/ML FlexTouch Pen Inject 10 Units into the skin at bedtime.     varenicline (CHANTIX) 1 MG tablet Take 1 mg by mouth 2 (two) times daily.     No current facility-administered medications for this visit.    PHYSICAL EXAMINATION:  ECOG PERFORMANCE STATUS: 0 - Asymptomatic   There were  no vitals filed for this visit.    There were no vitals filed for this visit.     Physical Exam Vitals and nursing note reviewed.  Constitutional:      Appearance: Normal appearance. She is obese. She is not toxic-appearing or diaphoretic.     Comments: Here alone.    HENT:     Head: Normocephalic and atraumatic.     Right Ear: External ear normal.     Left Ear: External ear normal.     Nose: Nose normal. No congestion or rhinorrhea.  Eyes:     General: No scleral icterus.    Extraocular Movements: Extraocular movements intact.     Conjunctiva/sclera: Conjunctivae normal.     Pupils: Pupils are equal, round, and reactive to light.  Cardiovascular:     Rate and Rhythm: Normal rate.     Heart sounds: No murmur heard.    No friction rub. No gallop.  Abdominal:     General: Bowel  sounds are normal.     Palpations: Abdomen is soft.  Musculoskeletal:        General: No swelling, tenderness or deformity.     Cervical back: Normal range of motion and neck supple. No rigidity or tenderness.  Lymphadenopathy:     Head:     Right side of head: No submental, submandibular, tonsillar, preauricular, posterior auricular or occipital adenopathy.     Left side of head: No submental, submandibular, tonsillar, preauricular, posterior auricular or occipital adenopathy.     Cervical: No cervical adenopathy.     Right cervical: No superficial, deep or posterior cervical adenopathy.    Left cervical: No superficial, deep or posterior cervical adenopathy.     Upper Body:     Right upper body: No supraclavicular, axillary, pectoral or epitrochlear adenopathy.     Left upper body: No supraclavicular, axillary, pectoral or epitrochlear adenopathy.  Skin:    General: Skin is warm and dry.     Coloration: Skin is not jaundiced.     Findings: Rash present.     Comments: Flushing of cheeks  Neurological:     General: No focal deficit present.     Mental Status: She is alert and oriented to person, place, and time. Mental status is at baseline.     Cranial Nerves: No cranial nerve deficit.  Psychiatric:        Mood and Affect: Mood normal.        Behavior: Behavior normal.        Thought Content: Thought content normal.        Judgment: Judgment normal.     Comments: Anxious    LABORATORY DATA: I have personally reviewed the data as listed:  Appointment on 06/19/2023  Component Date Value Ref Range Status   WBC Count 06/19/2023 11.0 (H)  4.0 - 10.5 K/uL Final   RBC 06/19/2023 4.63  3.87 - 5.11 MIL/uL Final   Hemoglobin 06/19/2023 13.1  12.0 - 15.0 g/dL Final   HCT 91/47/8295 40.1  36.0 - 46.0 % Final   MCV 06/19/2023 86.6  80.0 - 100.0 fL Final   MCH 06/19/2023 28.3  26.0 - 34.0 pg Final   MCHC 06/19/2023 32.7  30.0 - 36.0 g/dL Final   RDW 62/13/0865 13.6  11.5 - 15.5 % Final    Platelet Count 06/19/2023 306  150 - 400 K/uL Final   nRBC 06/19/2023 0.0  0.0 - 0.2 % Final   Neutrophils Relative % 06/19/2023 66  % Final  Neutro Abs 06/19/2023 7.2  1.7 - 7.7 K/uL Final   Lymphocytes Relative 06/19/2023 21  % Final   Lymphs Abs 06/19/2023 2.3  0.7 - 4.0 K/uL Final   Monocytes Relative 06/19/2023 8  % Final   Monocytes Absolute 06/19/2023 0.9  0.1 - 1.0 K/uL Final   Eosinophils Relative 06/19/2023 4  % Final   Eosinophils Absolute 06/19/2023 0.5  0.0 - 0.5 K/uL Final   Basophils Relative 06/19/2023 1  % Final   Basophils Absolute 06/19/2023 0.1  0.0 - 0.1 K/uL Final   Immature Granulocytes 06/19/2023 0  % Final   Abs Immature Granulocytes 06/19/2023 0.04  0.00 - 0.07 K/uL Final   Performed at [facility], [address] [place], [place] [address]   Sodium 06/19/2023 138  135 - 145 mmol/L Final   Potassium 06/19/2023 3.6  3.5 - 5.1 mmol/L Final   Chloride 06/19/2023 103  98 - 111 mmol/L Final   CO2 06/19/2023 23  22 - 32 mmol/L Final   Glucose, Bld 06/19/2023 126 (H)  70 - 99 mg/dL Final   Glucose reference range applies only to samples taken after fasting for at least 8 hours.   BUN 06/19/2023 11  6 - 20 mg/dL Final   Creatinine 19/14/7829 0.95  0.44 - 1.00 mg/dL Final   Calcium 56/21/3086 9.3  8.9 - 10.3 mg/dL Final   Total Protein 57/84/6962 7.2  6.5 - 8.1 g/dL Final   Albumin 95/28/4132 3.7  3.5 - 5.0 g/dL Final   AST 44/09/270 21  15 - 41 U/L Final   ALT 06/19/2023 20  0 - 44 U/L Final   Alkaline Phosphatase 06/19/2023 110  38 - 126 U/L Final   Total Bilirubin 06/19/2023 0.5  0.3 - 1.2 mg/dL Final   GFR, Estimated 06/19/2023 >60  >60 mL/min Final   Comment: (NOTE) Calculated using the CKD-EPI Creatinine Equation (2021)    Anion gap 06/19/2023 12  5 - 15 Final   Performed at [facility], [address], [place], [place] [address]   T4, Total 06/19/2023 7.4  4.5 - 12.0 ug/dL Final   Comment: (NOTE) Performed At:  [facility] [address] [place], [place] [id number] Jolene Schimke MD DG:[id number]    TSH 06/19/2023 3.496  0.350 - 4.500 uIU/mL Final   Comment: Performed by a 3rd Generation assay with a functional sensitivity of <=0.01 uIU/mL. Performed at [facility], [address] [place], [place] [address]     RADIOGRAPHIC STUDIES: I have personally reviewed the radiological images as listed and agree with the findings in the report  No results found.  ASSESSMENT/PLAN 47 y.o. female is here because of  malignant melanoma.  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  Biopsied November 23 2022- Wide local excision and sentinel LN bx.  (Patient did not have clinically apparent LN's by history, nor in transit disease) December 12 2022- Obtain BRAF mutation studies on surgical material December 16 2022 -PET/CT whole body.  No residual melanoma or metastatic disease.  Two small pulmonary nodules thought unrelated. MRI brain negative  Therapeutics   December 12 2022- Discussed use of adjuvant therapy to prevent recurrence.   December 20 2022- Per NCCN guidelines recommended adjuvant Pembrolizumab  January 03 2023- Cycle 1  Keytruda  Jan 23 2023- Slight skin rash which is being managed with low potency topical steroids  Jan 25 2023- Cycle 2 Keytruda  Feb 06 2023- Labs to evaluate for possible autoimmune symptoms from [facility] notable only for mild elevation in CRP.    Feb 14 2023- Reviewed results of labs with patient.  Suspect that some of sx are due to inadequately controlled DM Type II and recent bout of asthma/bronchitis  Feb 15 2023- Cycle 3 Keytruda  March 08 2023- Cycle 4 Keytruda  March 29, 2023:  Cycle 5 Keytruda   April 17 2023- Arrange for follow up whole body  PET/CT  April 19 2023- Cycle 6 Keytruda   May 08 2023- Results of excision bx performed by dermatology pending.  PET/CT denied by insurance company  May 10 2023- Cycle 7 Keytruda  May 31 2023:  Cycle 8 Keytruda  June 19 2023- To start Prednisone 10 mg daily and titrate upward as needed for rash due to checkpoint inhibitor.  Steroids can exacerbate hyperglycemia from DM Type II Wrote for follow up PET/CT    June 21, 2023: Cycle 9 Keytruda   June 26 2023:  PET/CT No evidence of recurrent or metastatic disease.  Postprocedural changes in the right lower extremity and right groin.    July 10 2023- On prednisone 30 mg daily for pruritus due to checkpoint inhibitor  July 14 2023- Cycle 10 Keytruda   New skin lesions  April 26 2023- To undergo excision bx of skin lesion on back.  Will obtain path reports.    Fatigue  May 08 2023- Multifactorial etiology with likely causes being 1) Keytruda 2) poorly controlled DM type II 3) obesity 4) depression  Discussed these concerns with patient's pcp  May 29 2023- DM Type II remains poorly controlled  Vivid dreams  Feb 06 2023- Began after starting Chantix for smoking cessation.  This is a known side effect.    Poor venous access:     December 22 2022- Portacath placed  Post operative seroma-  December 20 2022- Mild and should resolve with conservative management.  A common problem with inguinal lymphadenectomy.  Other risk factors are obesity, DM Type II and tobacco use  Jan 23 2023- Resolved with conservative management    Cancer Staging  Malignant melanoma of right lower leg [facility]) Staging form: Melanoma of the Skin, AJCC 8th Edition - Clinical stage from 12/12/2022: Stage III (cT4b, cN1a, cM0) - Signed by [name], MD on 12/12/2022 Histopathologic type: Nodular melanoma Stage prefix: Initial diagnosis    No problem-specific Assessment & Plan notes found for this encounter.    No orders of the defined  types were placed in this encounter.   30  minutes was spent in patient care.  This included time spent preparing to see the patient (e.g., review of tests), obtaining and/or reviewing separately obtained history, counseling and educating the patient/family/caregiver, ordering tests, or procedures; documenting clinical information in the electronic or other health record, independently interpreting results and communicating results to the patient/family/caregiver as well as coordination of care.       All questions were answered. The patient knows to call the clinic with any problems, questions or concerns.  This note was electronically signed.    [name], MD  07/10/2023 8:42 AM

## 2023-07-12 ENCOUNTER — Other Ambulatory Visit: Payer: Self-pay

## 2023-07-13 ENCOUNTER — Encounter: Payer: Self-pay | Admitting: Oncology

## 2023-07-13 ENCOUNTER — Other Ambulatory Visit: Payer: Self-pay

## 2023-07-13 DIAGNOSIS — C4371 Malignant melanoma of right lower limb, including hip: Secondary | ICD-10-CM

## 2023-07-13 MED FILL — Pembrolizumab IV Soln 100 MG/4ML (25 MG/ML): INTRAVENOUS | Qty: 8 | Status: AC

## 2023-07-14 ENCOUNTER — Inpatient Hospital Stay: Payer: 59

## 2023-07-14 ENCOUNTER — Encounter: Payer: Self-pay | Admitting: Oncology

## 2023-07-14 VITALS — BP 135/70 | HR 97 | Temp 98.3°F | Resp 18 | Ht 65.0 in | Wt 315.0 lb

## 2023-07-14 DIAGNOSIS — Z452 Encounter for adjustment and management of vascular access device: Secondary | ICD-10-CM | POA: Diagnosis not present

## 2023-07-14 DIAGNOSIS — C4371 Malignant melanoma of right lower limb, including hip: Secondary | ICD-10-CM

## 2023-07-14 DIAGNOSIS — G4733 Obstructive sleep apnea (adult) (pediatric): Secondary | ICD-10-CM

## 2023-07-14 DIAGNOSIS — Z5112 Encounter for antineoplastic immunotherapy: Secondary | ICD-10-CM | POA: Diagnosis not present

## 2023-07-14 DIAGNOSIS — Z79899 Other long term (current) drug therapy: Secondary | ICD-10-CM | POA: Diagnosis not present

## 2023-07-14 MED ORDER — ALTEPLASE 2 MG IJ SOLR
2.0000 mg | Freq: Once | INTRAMUSCULAR | Status: AC | PRN
Start: 2023-07-14 — End: 2023-07-14
  Administered 2023-07-14: 2 mg
  Filled 2023-07-14: qty 2

## 2023-07-14 MED ORDER — SODIUM CHLORIDE 0.9% FLUSH: 10.0000 mL | INTRAVENOUS | Status: DC | PRN

## 2023-07-14 MED ORDER — SODIUM CHLORIDE 0.9 % IV SOLN
200.0000 mg | Freq: Once | INTRAVENOUS | Status: AC
  Administered 2023-07-14: 200 mg via INTRAVENOUS
  Filled 2023-07-14: qty 8

## 2023-07-14 MED ORDER — SODIUM CHLORIDE 0.9 % IV SOLN: Freq: Once | INTRAVENOUS | Status: AC

## 2023-07-14 MED ORDER — HEPARIN SOD (PORK) LOCK FLUSH 100 UNIT/ML IV SOLN
500.0000 [IU] | Freq: Once | INTRAVENOUS | Status: DC | PRN
Start: 2023-07-14 — End: 2023-07-14

## 2023-07-14 NOTE — Patient Instructions (Signed)

## 2023-07-14 NOTE — Progress Notes (Signed)
1445 ok to use peripheral IV for keytruda infusion per Alberteen Sam Discussed with Marchelle Folks, CMA about ordering a dye study, pt is aware

## 2023-07-17 ENCOUNTER — Other Ambulatory Visit: Payer: Self-pay

## 2023-07-17 DIAGNOSIS — Z95828 Presence of other vascular implants and grafts: Secondary | ICD-10-CM

## 2023-07-19 ENCOUNTER — Telehealth: Payer: Self-pay

## 2023-07-19 NOTE — Telephone Encounter (Signed)
[facility] Clinical Social Work  Clinical Social Work was referred by medical provider for assessment of psychosocial needs.  Clinical Social Worker attempted to contact patient by phone to offer support and assess for needs.  No answer, left vm with direct contact information. CSW will attempt to reach patient.  Marguerita Merles, LCSWA Clinical Social Worker [facility]

## 2023-07-20 ENCOUNTER — Encounter: Payer: Self-pay | Admitting: Oncology

## 2023-07-20 DIAGNOSIS — Z7952 Long term (current) use of systemic steroids: Secondary | ICD-10-CM | POA: Insufficient documentation

## 2023-07-24 ENCOUNTER — Other Ambulatory Visit: Payer: Self-pay | Admitting: Oncology

## 2023-07-24 DIAGNOSIS — C4371 Malignant melanoma of right lower limb, including hip: Secondary | ICD-10-CM

## 2023-07-26 ENCOUNTER — Telehealth: Payer: Self-pay

## 2023-07-26 NOTE — Telephone Encounter (Signed)
[facility] Clinical Social Work  Clinical Social Work was referred by medical provider for assessment of psychosocial needs.  Clinical Social Worker attempted to contact patient by phone x2 to offer support and assess for needs.  No answer, left vm with direct contact information. CSW will close the referral. Medical team can refer again if needed.  Marguerita Merles, LCSWA Clinical Social Worker [facility]

## 2023-07-28 ENCOUNTER — Other Ambulatory Visit: Payer: Self-pay | Admitting: Oncology

## 2023-07-28 DIAGNOSIS — C4371 Malignant melanoma of right lower limb, including hip: Secondary | ICD-10-CM

## 2023-07-31 ENCOUNTER — Ambulatory Visit: Payer: 59

## 2023-07-31 ENCOUNTER — Ambulatory Visit: Payer: 59 | Admitting: Oncology

## 2023-07-31 ENCOUNTER — Other Ambulatory Visit: Payer: 59

## 2023-08-02 ENCOUNTER — Encounter: Payer: Self-pay | Admitting: Oncology

## 2023-08-02 ENCOUNTER — Inpatient Hospital Stay (HOSPITAL_BASED_OUTPATIENT_CLINIC_OR_DEPARTMENT_OTHER): Payer: 59 | Admitting: Oncology

## 2023-08-02 ENCOUNTER — Other Ambulatory Visit: Payer: Self-pay | Admitting: Oncology

## 2023-08-02 ENCOUNTER — Inpatient Hospital Stay: Payer: 59

## 2023-08-02 ENCOUNTER — Inpatient Hospital Stay: Payer: 59 | Attending: Oncology

## 2023-08-02 VITALS — BP 148/87 | HR 90 | Temp 99.2°F | Resp 16 | Ht 65.0 in | Wt 320.0 lb

## 2023-08-02 DIAGNOSIS — C4371 Malignant melanoma of right lower limb, including hip: Secondary | ICD-10-CM

## 2023-08-02 DIAGNOSIS — Z01419 Encounter for gynecological examination (general) (routine) without abnormal findings: Secondary | ICD-10-CM | POA: Diagnosis not present

## 2023-08-02 DIAGNOSIS — N898 Other specified noninflammatory disorders of vagina: Secondary | ICD-10-CM | POA: Diagnosis not present

## 2023-08-02 DIAGNOSIS — Z1151 Encounter for screening for human papillomavirus (HPV): Secondary | ICD-10-CM | POA: Diagnosis not present

## 2023-08-02 DIAGNOSIS — E1165 Type 2 diabetes mellitus with hyperglycemia: Secondary | ICD-10-CM | POA: Diagnosis not present

## 2023-08-02 DIAGNOSIS — Z5112 Encounter for antineoplastic immunotherapy: Secondary | ICD-10-CM | POA: Diagnosis present

## 2023-08-02 DIAGNOSIS — Z79899 Other long term (current) drug therapy: Secondary | ICD-10-CM | POA: Insufficient documentation

## 2023-08-02 DIAGNOSIS — Z95828 Presence of other vascular implants and grafts: Secondary | ICD-10-CM

## 2023-08-02 LAB — CBC WITH DIFFERENTIAL (CANCER CENTER ONLY)
Abs Immature Granulocytes: 0.09 10*3/uL — ABNORMAL HIGH (ref 0.00–0.07)
Basophils Absolute: 0.1 10*3/uL (ref 0.0–0.1)
Basophils Relative: 0 %
Eosinophils Absolute: 0.2 10*3/uL (ref 0.0–0.5)
Eosinophils Relative: 1 %
HCT: 38.7 % (ref 36.0–46.0)
Hemoglobin: 12.9 g/dL (ref 12.0–15.0)
Immature Granulocytes: 1 %
Lymphocytes Relative: 12 %
Lymphs Abs: 1.9 10*3/uL (ref 0.7–4.0)
MCH: 28.4 pg (ref 26.0–34.0)
MCHC: 33.3 g/dL (ref 30.0–36.0)
MCV: 85.1 fL (ref 80.0–100.0)
Monocytes Absolute: 1.1 10*3/uL — ABNORMAL HIGH (ref 0.1–1.0)
Monocytes Relative: 7 %
Neutro Abs: 12.5 10*3/uL — ABNORMAL HIGH (ref 1.7–7.7)
Neutrophils Relative %: 79 %
Platelet Count: 319 10*3/uL (ref 150–400)
RBC: 4.55 MIL/uL (ref 3.87–5.11)
RDW: 13.9 % (ref 11.5–15.5)
WBC Count: 15.8 10*3/uL — ABNORMAL HIGH (ref 4.0–10.5)
nRBC: 0 % (ref 0.0–0.2)
nRBC: 0 /100{WBCs}

## 2023-08-02 LAB — CMP (CANCER CENTER ONLY)
ALT: 19 U/L (ref 0–44)
AST: 16 U/L (ref 15–41)
Albumin: 3.9 g/dL (ref 3.5–5.0)
Alkaline Phosphatase: 99 U/L (ref 38–126)
Anion gap: 11 (ref 5–15)
BUN: 12 mg/dL (ref 6–20)
CO2: 27 mmol/L (ref 22–32)
Calcium: 9.5 mg/dL (ref 8.9–10.3)
Chloride: 103 mmol/L (ref 98–111)
Creatinine: 0.99 mg/dL (ref 0.44–1.00)
GFR, Estimated: >60 mL/min (ref >60)
Glucose, Bld: 92 mg/dL (ref 70–99)
Potassium: 3.9 mmol/L (ref 3.5–5.1)
Sodium: 140 mmol/L (ref 135–145)
Total Bilirubin: 0.3 mg/dL (ref <1.2)
Total Protein: 6.4 g/dL — ABNORMAL LOW (ref 6.5–8.1)

## 2023-08-02 NOTE — Progress Notes (Signed)
[facility]  [address] [place],  [place]  [address] [phone]  Clinic Day:  08/02/2023  Referring physician: [name], Irma M, *  HISTORY OF PRESENT ILLNESS:  The patient is a 47 y.o. female with stage IIIC (T4b N1a M0) melanoma, status post a wide local excision and right inguinal sentinel lymph node biopsy in March 2024.  Due to her melanoma staging, she is receiving adjuvant pembrolizumab immunotherapy.  She comes in today to be evaluated before heading into her 11th cycle of treatment.  The patient claims to have tolerated her 10th cycle of pembrolizumab very well.  She claims that she previously had a diffuse rash over her bilateral arms and hands, which she last recalls seeing after her eighth cycle of treatment.  Since that time, she has been taking prednisone 30 mg daily to ameliorate this rash.  However, she has been taking her prednisone while going through her last few cycles of pembrolizumab immunotherapy.  Overall, the patient is doing well.  However, she is somewhat concerned as she has noticed a small dark skin lesion develop just lateral to the area of her wide local excision.  She is not sure if this lesion may represent early signs of local melanoma recurrence.  Of note, she is scheduled to see her dermatologist next month.  PHYSICAL EXAM:  Blood pressure (!) 148/87, pulse 90, temperature 99.2 F (37.3 C), resp. rate 16, height 5\' 5"  (1.651 m), weight (!) 320 lb (145.2 kg), SpO2 97%. Wt Readings from Last 3 Encounters:  08/02/23 (!) 320 lb (145.2 kg)  07/14/23 (!) 315 lb (142.9 kg)  07/10/23 (!) 319 lb 6.4 oz (144.9 kg)   Body mass index is 53.25 kg/m. Performance status (ECOG): 1 - Symptomatic but completely ambulatory Physical Exam Constitutional:      Appearance: Normal appearance. She is not ill-appearing.  HENT:     Mouth/Throat:     Mouth: Mucous membranes are moist.     Pharynx: Oropharynx is clear. No oropharyngeal  exudate or posterior oropharyngeal erythema.  Cardiovascular:     Rate and Rhythm: Normal rate and regular rhythm.     Heart sounds: No murmur heard.    No friction rub. No gallop.  Pulmonary:     Effort: Pulmonary effort is normal. No respiratory distress.     Breath sounds: Normal breath sounds. No wheezing, rhonchi or rales.  Abdominal:     General: Bowel sounds are normal. There is no distension.     Palpations: Abdomen is soft. There is no mass.     Tenderness: There is no abdominal tenderness.  Musculoskeletal:        General: No swelling.     Right lower leg: No edema.     Left lower leg: No edema.  Lymphadenopathy:     Cervical: No cervical adenopathy.     Upper Body:     Right upper body: No supraclavicular or axillary adenopathy.     Left upper body: No supraclavicular or axillary adenopathy.     Lower Body: No right inguinal adenopathy. No left inguinal adenopathy.  Skin:    General: Skin is warm.     Coloration: Skin is not jaundiced.     Findings: Lesion (a dark 3-61mm skin lesion is laterally contiguous to her wide local excision scar) present. No rash.  Neurological:     General: No focal deficit present.     Mental Status: She is alert and oriented to person, place,  and time. Mental status is at baseline.  Psychiatric:        Mood and Affect: Mood normal.        Behavior: Behavior normal.        Thought Content: Thought content normal.    LABS:      Latest Ref Rng & Units 08/02/2023   11:48 AM 07/10/2023    3:32 PM 06/19/2023    3:12 PM  CBC  WBC 4.0 - 10.5 K/uL 15.8  20.5  11.0   Hemoglobin 12.0 - 15.0 g/dL 13.0  86.5  78.4   Hematocrit 36.0 - 46.0 % 38.7  41.9  40.1   Platelets 150 - 400 K/uL 319  314  306       Latest Ref Rng & Units 08/02/2023   11:48 AM 07/10/2023    3:32 PM 06/19/2023    3:12 PM  CMP  Glucose 70 - 99 mg/dL 92  696  295   BUN 6 - 20 mg/dL 12  18  11    Creatinine 0.44 - 1.00 mg/dL 2.84  1.32  4.40   Sodium 135 - 145 mmol/L 140   134  138   Potassium 3.5 - 5.1 mmol/L 3.9  4.1  3.6   Chloride 98 - 111 mmol/L 103  98  103   CO2 22 - 32 mmol/L 27  26  23    Calcium 8.9 - 10.3 mg/dL 9.5  9.4  9.3   Total Protein 6.5 - 8.1 g/dL 6.4  7.3  7.2   Total Bilirubin <1.2 mg/dL 0.3  0.3  0.5   Alkaline Phos 38 - 126 U/L 99  99  110   AST 15 - 41 U/L 16  16  21    ALT 0 - 44 U/L 19  20  20      ASSESSMENT & PLAN:  Assessment/Plan:  A 47 y.o. female with stage IIIC melanoma, status post a wide local excision and sentinel lymph node biopsy in March 2024.  Ideally, I would like for her to proceed with her 11th cycle of pembrolizumab today.  However, I do believe her prednisone needs to be weaned so it will not offset the immune effects from her pembrolizumab immunotherapy.  The patient has already taken her prednisone 30 mg today.  She will also take 30 mg of prednisone tomorrow, followed by 20 mg daily for the following 2 days, followed by 10 mg daily for the next 2 days, then off.  By this time next week, the patient should have been weaned off of her prednisone to where she will proceed with her 11th cycle of pembrolizumab on November 20th.  Of note, the patient is having problems with respect to her port being occluded.  I will have interventional radiology access her port with the hopes of making it patent again to where her future doses of pembrolizumab can be given back through it again.  Until this issue is rectified, her pembrolizumab infusions will continue to be given peripherally.  With respect to the skin lesion on her leg, it is very hard to determine if this is scar tissue from her wide local excision or if it could be the early signs of melanoma developing on her margin.  She knows to take a picture of this lesion on a weekly basis to ensure that it is not enlarging.  She also knows to bring it to the attention of her dermatologist when she sees them next month.  Otherwise, I will see  this patient back in 4 weeks before she heads  into her 12th cycle of adjuvant pembrolizumab immunotherapy.  The patient understands all the plans discussed today and is in agreement with them.    Rayvon Dakin Kirby Funk, MD

## 2023-08-02 NOTE — Progress Notes (Unsigned)
1110 THE PATIENT C/O PAIN AT RIGHT SIDE OF NECK WHEN HER PORT WAS FLUSHED TODAY.  NO BLOOD RETURNED NOTED. I WILL INFORM DR. Melvyn Neth REGARDING THIS. HUBER NEEDLE LEFT ACCESSED UNTIL AFTER THE MD VISIT TODAY. THE PATIENT HAD TROUBLES BEFORE WITH HER PORT. RECOMMEND DYE STUDY TO BE DONE.

## 2023-08-02 NOTE — Progress Notes (Unsigned)
Dr Melvyn Neth notified of pain with port flush- Also aware that port does not provide blood return- States he will arrange dye study for port placement follow up.

## 2023-08-03 ENCOUNTER — Other Ambulatory Visit: Payer: Self-pay

## 2023-08-04 ENCOUNTER — Ambulatory Visit (HOSPITAL_COMMUNITY)
Admission: RE | Admit: 2023-08-04 | Discharge: 2023-08-04 | Disposition: A | Discharge status: Home / Self Care | Payer: 59 | Source: Ambulatory Visit | Attending: Oncology | Admitting: Oncology

## 2023-08-04 ENCOUNTER — Other Ambulatory Visit: Payer: Self-pay | Admitting: Oncology

## 2023-08-04 DIAGNOSIS — Z452 Encounter for adjustment and management of vascular access device: Secondary | ICD-10-CM | POA: Insufficient documentation

## 2023-08-04 DIAGNOSIS — Z95828 Presence of other vascular implants and grafts: Secondary | ICD-10-CM

## 2023-08-04 DIAGNOSIS — T82598A Other mechanical complication of other cardiac and vascular devices and implants, initial encounter: Secondary | ICD-10-CM | POA: Diagnosis not present

## 2023-08-04 HISTORY — PX: IR CV LINE INJECTION: IMG2294

## 2023-08-04 MED ORDER — IOHEXOL 300 MG/ML  SOLN: 50.0000 mL | Freq: Once | INTRAMUSCULAR | Status: DC | PRN

## 2023-08-07 ENCOUNTER — Other Ambulatory Visit: Payer: Self-pay | Admitting: Radiology

## 2023-08-07 DIAGNOSIS — C4371 Malignant melanoma of right lower limb, including hip: Secondary | ICD-10-CM

## 2023-08-07 NOTE — H&P (Shared)
Chief Complaint: Patient was seen in consultation today for malfunctioning Port-A-Cath removal and replacement at the request of Dr. Helene Kelp, MD  Supervising Physician: Marliss Coots  Patient Status: [facility] - Out-pt  History of Present Illness: Diana Odom is a 47 y.o. female   [...] Code status  The patient is a 47 y.o. female with stage IIIC (T4b N1a M0) melanoma, status post a wide local excision and right inguinal sentinel lymph node biopsy in March 2024.  Due to her melanoma staging, she is receiving adjuvant pembrolizumab immunotherapy.   Patient completed her 10th cycle of immunotherapy on 10/25 through peripheral IV as her port caused pain on flushing and did not return blood.   Injection line study on 11/15: FINDINGS: RIGHT jugular-approach port catheter with tip projecting of the proximal SVC.   Kink at the RIGHT neck with catheter fracture and POSITIVE contrast extravasation into the soft tissues. See key image   IMPRESSION: Examination is POSITIVE for kink and catheter fracture with contrast extravasation into the soft tissues.   DO NOT USE RIGHT chest port.   RECOMMENDATIONS: The patient will return to Interventional Radiology for port catheter removal and replacement.   Roanna Banning, MD  Patient was recommended for malfunctioning Port-A-Cath replacement in IR. She is scheduled for same today.    Past Medical History:  Diagnosis Date   Cancer (HCC)    Depression    Diabetes mellitus without complication (HCC)    Hypertension    Melanoma (HCC)    Sleep apnea     Past Surgical History:  Procedure Laterality Date   CHOLECYSTECTOMY     IR CV LINE INJECTION  08/04/2023   MELANOMA EXCISION WITH SENTINEL LYMPH NODE BIOPSY      Allergies: Patient has no known allergies.  Medications: Prior to Admission medications   Medication Sig Start Date End Date Taking? Authorizing Provider  albuterol (VENTOLIN HFA) 108 (90 Base) MCG/ACT inhaler  Inhale 2 puffs into the lungs every 6 (six) hours as needed for wheezing or shortness of breath. 02/08/23   Cobb, Ruby Cola, NP  atorvastatin (LIPITOR) 10 MG tablet Take 10 mg by mouth at bedtime. 05/03/23   [provider]  FLUoxetine (PROZAC) 40 MG capsule Take 40 mg by mouth daily. 05/25/23   [provider]  hydrochlorothiazide (HYDRODIURIL) 25 MG tablet Take by mouth.    [provider]  hydrOXYzine (ATARAX) 25 MG tablet Take 25 mg by mouth 2 (two) times daily. 01/03/23   [provider]  lidocaine-prilocaine (EMLA) cream Apply to affected area once 03/29/23   [name], MD  lisinopril (ZESTRIL) 20 MG tablet Take 1 tablet by mouth daily.    [provider]  metFORMIN (GLUCOPHAGE) 1000 MG tablet Take 1 tablet (1,000 mg total) by mouth 2 (two) times daily with a meal. 10/10/21   Mancel Bale, MD  metoprolol succinate (TOPROL-XL) 25 MG 24 hr tablet Take by mouth.    [provider]  montelukast (SINGULAIR) 10 MG tablet Take 10 mg by mouth daily.    [provider]  NOVOLOG FLEXPEN 100 UNIT/ML FlexPen Inject into the skin as directed. Sliding scale 03/16/23   [provider]  ondansetron (ZOFRAN) 8 MG tablet Take 1 tablet (8 mg total) by mouth every 8 (eight) hours as needed for nausea or vomiting. 12/28/22   [name], MD  potassium chloride (MICRO-K) 10 MEQ CR capsule Take 10 mEq by mouth 2 (two) times daily. 01/03/23  [provider]  predniSONE (DELTASONE) 10 MG tablet Take 3 tablets (30 mg total) by mouth daily with breakfast. Three tablets daily with breakfast 06/30/23 12/27/23  [name], MD  prochlorperazine (COMPAZINE) 10 MG tablet Take 1 tablet (10 mg total) by mouth every 6 (six) hours as needed for nausea or vomiting. 12/28/22   [name], MD  Semaglutide (OZEMPIC, 0.25 OR 0.5 MG/DOSE, [place]) Inject 1 mL into the skin once a week.    [provider]  TRESIBA FLEXTOUCH  100 UNIT/ML FlexTouch Pen Inject 10 Units into the skin at bedtime. 03/06/23   [provider]  varenicline (CHANTIX) 1 MG tablet Take 1 mg by mouth 2 (two) times daily. 03/07/23   [provider]     No family history on file.  Social History   Socioeconomic History   Marital status: Divorced    Spouse name: Not on file   Number of children: 2   Years of education: 12   Highest education level: 12th grade  Occupational History    Comment: Print production planner  Tobacco Use   Smoking status: Former    Current packs/day: 0.00    Average packs/day: 0.5 packs/day for 30.0 years (15.0 ttl pk-yrs)    Types: Cigarettes    Start date: 11/22/1992    Quit date: 11/23/2022    Years since quitting: 0.7   Smokeless tobacco: Not on file  Vaping Use   Vaping status: Some Days   Substances: CBD   Devices: CBD vape occasionally  Substance and Sexual Activity   Alcohol use: Never   Drug use: No    Types: Marijuana    Comment: former marijuana smoker   Sexual activity: Not Currently  Other Topics Concern   Not on file  Social History Narrative   Not on file   Social Determinants of Health   Financial Resource Strain: Medium Risk (12/28/2022)   Overall Financial Resource Strain (CARDIA)    Difficulty of Paying Living Expenses: Somewhat hard  Food Insecurity: Low Risk  (03/06/2023)   Received from [facility]   Hunger Vital Sign    Worried About Running Out of Food in the Last Year: Never true    Ran Out of Food in the Last Year: Never true  Transportation Needs: Not on file (03/06/2023)  Physical Activity: Inactive (12/28/2022)   Exercise Vital Sign    Days of Exercise per Week: 0 days    Minutes of Exercise per Session: 0 min  Stress: Stress Concern Present (12/28/2022)   [employer] of Occupational Health - Occupational Stress Questionnaire    Feeling of Stress : Rather much  Social Connections: Moderately Isolated (12/28/2022)   Social Connection and Isolation Panel  [NHANES]    Frequency of Communication with Friends and Family: More than three times a week    Frequency of Social Gatherings with Friends and Family: More than three times a week    Attends Religious Services: 1 to 4 times per year    Active Member of [employer] or Organizations: No    Attends Banker Meetings: Never    Marital Status: Divorced    Review of Systems: A 12 point ROS discussed and pertinent positives are indicated in the HPI above.  All other systems are negative.  Review of Systems  Vital Signs: There were no vitals taken for this visit.   Physical Exam  Imaging: IR CV Line Injection  Result Date: 08/04/2023 INDICATION: Poor blood return on Port.  History of melanoma on immunotherapy. Surgically-placed port catheter inserted [facility]/11/2023 at [facility]. EXAM: FLUOROSCOPIC GUIDED PORT A CATHETER INJECTION COMPARISON:  Chest XR, 02/23/2023.  PET-CT, 06/26/2023. MEDICATIONS: None ANESTHESIA/SEDATION: None CONTRAST:  10 mL Isovue-300 FLUOROSCOPY TIME:  Fluoroscopic dose; 1 mGy COMPLICATIONS: None immediate. PROCEDURE: The patient's RIGHT anterior chest wall subclavian vein approach port a catheter was accessed under sterile precautions. The patient was placed supine on the fluoroscopy table. A preprocedural spot radiographic image was obtained of the existing RIGHT jugular-approach port a catheter with tip overlying the proximal aspect of the SVC. Port a Catheter with noted difficulty to aspirate. Multiple spot fluoroscopic and angiographic images were obtained in various obliquities following the injection of a small amount of contrast via the Port a catheter. Images were reviewed and the procedure was terminated. The port was de-accessed. A dressing was placed. The patient tolerated the procedure well without immediate postprocedural complication. FINDINGS: RIGHT jugular-approach port catheter with tip projecting of the proximal SVC. Kink at the RIGHT neck with  catheter fracture and POSITIVE contrast extravasation into the soft tissues. See key image IMPRESSION: Examination is POSITIVE for kink and catheter fracture with contrast extravasation into the soft tissues. DO NOT USE RIGHT chest port. RECOMMENDATIONS: The patient will return to Interventional Radiology for port catheter removal and replacement. Roanna Banning, MD [facility] Electronically Signed   By: Roanna Banning M.D.   On: 08/04/2023 17:32    Labs:  CBC: Recent Labs    05/29/23 1510 06/19/23 1512 07/10/23 1532 08/02/23 1148  WBC 12.1* 11.0* 20.5* 15.8*  HGB 12.8 13.1 13.5 12.9  HCT 38.8 40.1 41.9 38.7  PLT 294 306 314 319    COAGS: No results for input(s): "INR", "APTT" in the last 8760 hours.  BMP: Recent Labs    05/29/23 1510 06/19/23 1512 07/10/23 1532 08/02/23 1148  NA 136 138 134* 140  K 3.5 3.6 4.1 3.9  CL 101 103 98 103  CO2 23 23 26 27   GLUCOSE 149* 126* 138* 92  BUN 15 11 18 12   CALCIUM 9.4 9.3 9.4 9.5  CREATININE 0.65 0.95 0.88 0.99  GFRNONAA >60 >60 >60 >60    LIVER FUNCTION TESTS: Recent Labs    05/29/23 1510 06/19/23 1512 07/10/23 1532 08/02/23 1148  BILITOT 0.6 0.5 0.3 0.3  AST 20 21 16 16   ALT 27 20 20 19   ALKPHOS 97 110 99 99  PROT 7.0 7.2 7.3 6.4*  ALBUMIN 3.6 3.7 3.8 3.9    TUMOR MARKERS: No results for input(s): "AFPTM", "CEA", "CA199", "CHROMGRNA" in the last 8760 hours.  Assessment and Plan:  Patient is noted with malfunctioning Port-A-Cath on line study dated 11/15. She is experiencing pain on port access and no blood return.She presents today for Port-A-Cath replacement. Risks and benefits of image guided port-a-catheter removal and replacement was discussed with the patient including, but not limited to bleeding, infection, pneumothorax, or fibrin sheath development and need for additional procedures.  All of the patient's questions were answered, patient is agreeable to  proceed. Consent signed and in chart.   Thank you for this interesting consult.  I greatly enjoyed meeting Kenlea L Duck and look forward to participating in their care.  A copy of this report was sent to the requesting provider on this date.  Electronically Signed: Sable Feil, PA-C 08/07/2023, 1:42 PM   I spent a total of  30 Minutes   in face to face in clinical consultation,  greater than 50% of which was counseling/coordinating care for Port-A-Cath removal and replacement

## 2023-08-08 ENCOUNTER — Encounter (HOSPITAL_COMMUNITY): Payer: Self-pay

## 2023-08-08 ENCOUNTER — Other Ambulatory Visit: Payer: Self-pay

## 2023-08-08 ENCOUNTER — Ambulatory Visit (HOSPITAL_COMMUNITY)
Admission: RE | Admit: 2023-08-08 | Discharge: 2023-08-08 | Disposition: A | Discharge status: Home / Self Care | Payer: 59 | Source: Ambulatory Visit | Attending: Interventional Radiology | Admitting: Interventional Radiology

## 2023-08-08 ENCOUNTER — Other Ambulatory Visit: Payer: Self-pay | Admitting: Oncology

## 2023-08-08 VITALS — BP 136/84 | HR 85 | Temp 98.7°F | Resp 16 | Ht 65.0 in | Wt 314.4 lb

## 2023-08-08 DIAGNOSIS — Z95828 Presence of other vascular implants and grafts: Secondary | ICD-10-CM

## 2023-08-08 DIAGNOSIS — E119 Type 2 diabetes mellitus without complications: Secondary | ICD-10-CM | POA: Insufficient documentation

## 2023-08-08 DIAGNOSIS — G4730 Sleep apnea, unspecified: Secondary | ICD-10-CM | POA: Diagnosis not present

## 2023-08-08 DIAGNOSIS — Z794 Long term (current) use of insulin: Secondary | ICD-10-CM | POA: Insufficient documentation

## 2023-08-08 DIAGNOSIS — T82598A Other mechanical complication of other cardiac and vascular devices and implants, initial encounter: Secondary | ICD-10-CM | POA: Diagnosis not present

## 2023-08-08 DIAGNOSIS — Z7984 Long term (current) use of oral hypoglycemic drugs: Secondary | ICD-10-CM | POA: Insufficient documentation

## 2023-08-08 DIAGNOSIS — Z452 Encounter for adjustment and management of vascular access device: Secondary | ICD-10-CM | POA: Insufficient documentation

## 2023-08-08 DIAGNOSIS — Z7985 Long-term (current) use of injectable non-insulin antidiabetic drugs: Secondary | ICD-10-CM | POA: Insufficient documentation

## 2023-08-08 DIAGNOSIS — Z85820 Personal history of malignant melanoma of skin: Secondary | ICD-10-CM | POA: Insufficient documentation

## 2023-08-08 DIAGNOSIS — I10 Essential (primary) hypertension: Secondary | ICD-10-CM | POA: Insufficient documentation

## 2023-08-08 DIAGNOSIS — Z87891 Personal history of nicotine dependence: Secondary | ICD-10-CM | POA: Insufficient documentation

## 2023-08-08 DIAGNOSIS — C439 Malignant melanoma of skin, unspecified: Secondary | ICD-10-CM | POA: Diagnosis not present

## 2023-08-08 DIAGNOSIS — I878 Other specified disorders of veins: Secondary | ICD-10-CM

## 2023-08-08 HISTORY — PX: IR IMAGING GUIDED PORT INSERTION: IMG5740

## 2023-08-08 HISTORY — PX: IR REMOVAL TUN ACCESS W/ PORT W/O FL MOD SED: IMG2290

## 2023-08-08 LAB — POCT PREGNANCY, URINE: Preg Test, Ur: NEGATIVE

## 2023-08-08 MED ORDER — LIDOCAINE-EPINEPHRINE 1 %-1:100000 IJ SOLN
20.0000 mL | Freq: Once | INTRAMUSCULAR | Status: AC
  Administered 2023-08-08: 20 mL via INTRADERMAL

## 2023-08-08 MED ORDER — OXYCODONE HCL 5 MG PO TABS
ORAL_TABLET | ORAL | Status: AC
  Filled 2023-08-08: qty 1

## 2023-08-08 MED ORDER — OXYCODONE-ACETAMINOPHEN 5-325 MG PO TABS: 1.0000 | ORAL_TABLET | Freq: Once | ORAL | Status: DC

## 2023-08-08 MED ORDER — MIDAZOLAM HCL 2 MG/2ML IJ SOLN
INTRAMUSCULAR | Status: AC
  Filled 2023-08-08: qty 4

## 2023-08-08 MED ORDER — LIDOCAINE-EPINEPHRINE 1 %-1:100000 IJ SOLN
INTRAMUSCULAR | Status: AC
Start: 2023-08-08 — End: ?
  Filled 2023-08-08: qty 1

## 2023-08-08 MED ORDER — FENTANYL CITRATE (PF) 100 MCG/2ML IJ SOLN
INTRAMUSCULAR | Status: AC
  Filled 2023-08-08: qty 4

## 2023-08-08 MED ORDER — FENTANYL CITRATE (PF) 100 MCG/2ML IJ SOLN
INTRAMUSCULAR | Status: AC | PRN
  Administered 2023-08-08 (×4): 50 ug via INTRAVENOUS

## 2023-08-08 MED ORDER — MIDAZOLAM HCL 2 MG/2ML IJ SOLN
INTRAMUSCULAR | Status: AC | PRN
  Administered 2023-08-08 (×4): 1 mg via INTRAVENOUS

## 2023-08-08 MED ORDER — HEPARIN SOD (PORK) LOCK FLUSH 100 UNIT/ML IV SOLN
500.0000 [IU] | Freq: Once | INTRAVENOUS | Status: AC
  Administered 2023-08-08: 500 [IU] via INTRAVENOUS

## 2023-08-08 MED ORDER — HEPARIN SOD (PORK) LOCK FLUSH 100 UNIT/ML IV SOLN
INTRAVENOUS | Status: AC
  Filled 2023-08-08: qty 5

## 2023-08-08 MED ORDER — OXYCODONE HCL 5 MG PO TABS
5.0000 mg | ORAL_TABLET | Freq: Once | ORAL | Status: AC
  Administered 2023-08-08: 5 mg via ORAL

## 2023-08-08 MED ORDER — MIDAZOLAM HCL 2 MG/2ML IJ SOLN
INTRAMUSCULAR | Status: AC
  Filled 2023-08-08: qty 2

## 2023-08-08 MED ORDER — SODIUM CHLORIDE 0.9 % IV SOLN: INTRAVENOUS | Status: DC

## 2023-08-08 NOTE — Procedures (Signed)
Interventional Radiology Procedure Note ° °Procedure: Single Lumen Power Port Placement   ° °Access:  Right internal jugular vein ° °Findings: Catheter tip positioned at cavoatrial junction. Port is ready for immediate use.  ° °Complications: None ° °EBL: < 10 mL ° °Recommendations:  °- Ok to shower in 24 hours °- Do not submerge for 7 days °- Routine line care  ° ° °Mithran Strike, MD ° ° ° °

## 2023-08-08 NOTE — Discharge Instructions (Addendum)
Please call Interventional Radiology clinic [phone] with any questions or concerns.  You may remove your dressing and shower tomorrow.  DO NOT use EMLA cream on your port site for 2 weeks as this cream will remove surgical glue on your incision.  Implanted Port Insertion/Removal, Care After This sheet gives you information about how to care for yourself after your procedure. Your health care provider may also give you more specific instructions. If you have problems or questions, contact your health care provider. What can I expect after the procedure? After the procedure, it is common to have: Discomfort at the port insertion site. Bruising on the skin over the port. This should improve over 3-4 days. Follow these instructions at home: [facility] care After your port is placed, you will get a manufacturer's information card. The card has information about your port. Keep this card with you at all times. Take care of the port as told by your health care provider. Ask your health care provider if you or a family member can get training for taking care of the port at home. A home health care nurse may also take care of the port. Make sure to remember what type of port you have. Incision care Follow instructions from your health care provider about how to take care of your port insertion site. Make sure you: Wash your hands with soap and water before and after you change your bandage (dressing). If soap and water are not available, use hand sanitizer. Change your dressing as told by your health care provider. Leave stitches (sutures), skin glue, or adhesive strips in place. These skin closures may need to stay in place for 2 weeks or longer. If adhesive strip edges start to loosen and curl up, you may trim the loose edges. Do not remove adhesive strips completely unless your health care provider tells you to do that. Check your port insertion site every day for signs of infection. Check  for: Redness, swelling, or pain. Fluid or blood. Warmth. Pus or a bad smell.        Activity Return to your normal activities as told by your health care provider. Ask your health care provider what activities are safe for you. Do not lift anything that is heavier than 10 lb (4.5 kg), or the limit that you are told, until your health care provider says that it is safe. General instructions Take over-the-counter and prescription medicines only as told by your health care provider. Do not take baths, swim, or use a hot tub until your health care provider approves. Ask your health care provider if you may take showers. You may only be allowed to take sponge baths. Do not drive for 24 hours if you were given a sedative during your procedure. Wear a medical alert bracelet in case of an emergency. This will tell any health care providers that you have a port. Keep all follow-up visits as told by your health care provider. This is important. Contact a health care provider if: You cannot flush your port with saline as directed, or you cannot draw blood from the port. You have a fever or chills. You have redness, swelling, or pain around your port insertion site. You have fluid or blood coming from your port insertion site. Your port insertion site feels warm to the touch. You have pus or a bad smell coming from the port insertion site. Get help right away if: You have chest pain or shortness of breath. You have bleeding from  your port that you cannot control. Summary Take care of the port as told by your health care provider. Keep the manufacturer's information card with you at all times. Change your dressing as told by your health care provider. Contact a health care provider if you have a fever or chills or if you have redness, swelling, or pain around your port insertion site. Keep all follow-up visits as told by your health care provider. This information is not intended to replace  advice given to you by your health care provider. Make sure you discuss any questions you have with your health care provider. Document Revised: 04/03/2018 Document Reviewed: 04/03/2018 Elsevier Patient Education  2021 Elsevier Inc.   Moderate Conscious Sedation, Adult, Care After This sheet gives you information about how to care for yourself after your procedure. Your health care provider may also give you more specific instructions. If you have problems or questions, contact your health care provider. What can I expect after the procedure? After the procedure, it is common to have: Sleepiness for several hours. Impaired judgment for several hours. Difficulty with balance. Vomiting if you eat too soon. Follow these instructions at home: For the time period you were told by your health care provider: Rest. Do not participate in activities where you could fall or become injured. Do not drive or use machinery. Do not drink alcohol. Do not take sleeping pills or medicines that cause drowsiness. Do not make important decisions or sign legal documents. Do not take care of children on your own.        Eating and drinking Follow the diet recommended by your health care provider. Drink enough fluid to keep your urine pale yellow. If you vomit: Drink water, juice, or soup when you can drink without vomiting. Make sure you have little or no nausea before eating solid foods.    General instructions Take over-the-counter and prescription medicines only as told by your health care provider. Have a responsible adult stay with you for the time you are told. It is important to have someone help care for you until you are awake and alert. Do not smoke. Keep all follow-up visits as told by your health care provider. This is important. Contact a health care provider if: You are still sleepy or having trouble with balance after 24 hours. You feel light-headed. You keep feeling nauseous or you  keep vomiting. You develop a rash. You have a fever. You have redness or swelling around the IV site. Get help right away if: You have trouble breathing. You have new-onset confusion at home. Summary After the procedure, it is common to feel sleepy, have impaired judgment, or feel nauseous if you eat too soon. Rest after you get home. Know the things you should not do after the procedure. Follow the diet recommended by your health care provider and drink enough fluid to keep your urine pale yellow. Get help right away if you have trouble breathing or new-onset confusion at home. This information is not intended to replace advice given to you by your health care provider. Make sure you discuss any questions you have with your health care provider. Document Revised: 01/03/2020 Document Reviewed: 08/01/2019 Elsevier Patient Education  2021 [employer].

## 2023-08-09 ENCOUNTER — Inpatient Hospital Stay: Payer: 59

## 2023-08-09 VITALS — BP 140/70 | HR 88 | Temp 98.0°F | Resp 18

## 2023-08-09 DIAGNOSIS — C4371 Malignant melanoma of right lower limb, including hip: Secondary | ICD-10-CM

## 2023-08-09 DIAGNOSIS — Z5112 Encounter for antineoplastic immunotherapy: Secondary | ICD-10-CM | POA: Diagnosis not present

## 2023-08-09 MED ORDER — DIPHENHYDRAMINE HCL 50 MG/ML IJ SOLN
25.0000 mg | Freq: Once | INTRAMUSCULAR | Status: AC
  Administered 2023-08-09: 25 mg via INTRAVENOUS
  Filled 2023-08-09: qty 1

## 2023-08-09 MED ORDER — FAMOTIDINE IN NACL 20-0.9 MG/50ML-% IV SOLN
20.0000 mg | Freq: Once | INTRAVENOUS | Status: AC
  Administered 2023-08-09: 20 mg via INTRAVENOUS
  Filled 2023-08-09: qty 50

## 2023-08-09 MED ORDER — SODIUM CHLORIDE 0.9 % IV SOLN: Freq: Once | INTRAVENOUS | Status: AC

## 2023-08-09 MED ORDER — PEMBROLIZUMAB CHEMO INJECTION 100 MG/4ML
200.0000 mg | Freq: Once | INTRAVENOUS | Status: AC
  Administered 2023-08-09: 200 mg via INTRAVENOUS
  Filled 2023-08-09: qty 8

## 2023-08-09 NOTE — Patient Instructions (Signed)

## 2023-08-24 ENCOUNTER — Ambulatory Visit: Payer: 59 | Admitting: Dermatology

## 2023-08-28 ENCOUNTER — Encounter: Payer: Self-pay | Admitting: Pulmonary Disease

## 2023-08-29 ENCOUNTER — Other Ambulatory Visit: Payer: Self-pay

## 2023-08-29 ENCOUNTER — Encounter: Payer: Self-pay | Admitting: Oncology

## 2023-08-30 ENCOUNTER — Ambulatory Visit: Payer: 59

## 2023-08-30 ENCOUNTER — Ambulatory Visit: Payer: 59 | Admitting: Hematology and Oncology

## 2023-08-30 ENCOUNTER — Ambulatory Visit: Payer: 59 | Admitting: Dermatology

## 2023-08-30 ENCOUNTER — Inpatient Hospital Stay: Payer: 59

## 2023-08-31 NOTE — Progress Notes (Cosign Needed Addendum)
[facility]  [address] [place],  [place]  [address] [phone]  Clinic Day:  09/01/2023  Referring physician: [name], Irma M, *  HISTORY OF PRESENT ILLNESS:  The patient is a 47 y.o. female with stage IIIC (T4b N1a M0) melanoma, status post a wide local excision and right inguinal sentinel lymph node biopsy in March 2024.  Due to her melanoma staging, she is receiving adjuvant pembrolizumab immunotherapy.  She comes in today to be evaluated before heading into her 12th cycle of treatment.  The patient claims to have tolerated her 11th cycle of pembrolizumab fairly well.  She reports persistent itchy rash which improves with benadryl. She requested a to receive benadryl and famotidine during treatment as she felt this helped with the itching and rash with her last cycle.  She also requests a handicap placard as she has been experiencing increased fatigue.  She also reports mild shortness of breath on exertion.  She denies cough, chest pain, palpitations or lightheadedness.  She denies fevers, chills, or night sweats.  She states the lesion adjacent to her surgical site in her right leg has enlarged.  She was scheduled with her dermatologist this month, but missed the appointment due to work.  She is scheduled to see dermatology towards the end of the month.  PHYSICAL EXAM:  Blood pressure (!) 146/98, pulse 80, temperature 97.8 F (36.6 C), temperature source Oral, resp. rate 20, height 5\' 5"  (1.651 m), weight (!) 313 lb 9.6 oz (142.2 kg), SpO2 98%. Wt Readings from Last 3 Encounters:  09/01/23 (!) 313 lb 9.6 oz (142.2 kg)  08/08/23 (!) 314 lb 6.4 oz (142.6 kg)  08/02/23 (!) 320 lb (145.2 kg)   Body mass index is 52.19 kg/m.  Performance status (ECOG): 1 - Symptomatic but completely ambulatory  Physical Exam Vitals and nursing note reviewed.  Constitutional:      General: She is not in acute distress.    Appearance: She is obese. She is not ill-appearing.   HENT:     Head: Normocephalic and atraumatic.     Mouth/Throat:     Mouth: Mucous membranes are moist.     Pharynx: Oropharynx is clear. No oropharyngeal exudate or posterior oropharyngeal erythema.  Eyes:     General: No scleral icterus.    Extraocular Movements: Extraocular movements intact.     Conjunctiva/sclera: Conjunctivae normal.     Pupils: Pupils are equal, round, and reactive to light.  Cardiovascular:     Rate and Rhythm: Normal rate and regular rhythm.     Heart sounds: Normal heart sounds. No murmur heard.    No friction rub. No gallop.  Pulmonary:     Effort: Pulmonary effort is normal.     Breath sounds: Normal breath sounds. No wheezing, rhonchi or rales.  Abdominal:     General: Abdomen is protuberant. There is no distension.     Palpations: Abdomen is soft. There is no mass.     Tenderness: There is no abdominal tenderness.     Comments: Difficult to assess for hepatosplenomegaly due to body habitus  Musculoskeletal:        General: Normal range of motion.     Cervical back: Normal range of motion and neck supple. No tenderness.     Right lower leg: No edema.     Left lower leg: No edema.  Lymphadenopathy:     Cervical: No cervical adenopathy.     Upper Body:     Right  upper body: No supraclavicular or axillary adenopathy.     Left upper body: No supraclavicular or axillary adenopathy.     Lower Body: No right inguinal adenopathy. No left inguinal adenopathy.  Skin:    General: Skin is warm and dry.     Coloration: Skin is not jaundiced.     Findings: No rash.     Comments: Lesion of the right lower extremity adjacent to her surgical scar is at least 5mm in greatest dimension and now nodular.  There is no current skin rash.  Neurological:     Mental Status: She is alert and oriented to person, place, and time.     Cranial Nerves: No cranial nerve deficit.  Psychiatric:        Mood and Affect: Mood normal.        Behavior: Behavior normal.         Thought Content: Thought content normal.    LABS:      Latest Ref Rng & Units 09/01/2023    8:17 AM 08/02/2023   11:48 AM 07/10/2023    3:32 PM  CBC  WBC 4.0 - 10.5 K/uL 7.6  15.8  20.5   Hemoglobin 12.0 - 15.0 g/dL 16.1  09.6  04.5   Hematocrit 36.0 - 46.0 % 35.7  38.7  41.9   Platelets 150 - 400 K/uL 237  319  314       Latest Ref Rng & Units 09/01/2023    8:17 AM 08/02/2023   11:48 AM 07/10/2023    3:32 PM  CMP  Glucose 70 - 99 mg/dL 409  92  811   BUN 6 - 20 mg/dL 16  12  18    Creatinine 0.44 - 1.00 mg/dL 9.14  7.82  9.56   Sodium 135 - 145 mmol/L 139  140  134   Potassium 3.5 - 5.1 mmol/L 3.8  3.9  4.1   Chloride 98 - 111 mmol/L 103  103  98   CO2 22 - 32 mmol/L 25  27  26    Calcium 8.9 - 10.3 mg/dL 9.3  9.5  9.4   Total Protein 6.5 - 8.1 g/dL 6.2  6.4  7.3   Total Bilirubin <1.2 mg/dL 0.4  0.3  0.3   Alkaline Phos 38 - 126 U/L 94  99  99   AST 15 - 41 U/L 19  16  16    ALT 0 - 44 U/L 17  19  20      Lab Results  Component Value Date   LDH 123 12/12/2022       Component Value Date/Time   LDH 123 12/12/2022 1515    Review Flowsheet       Latest Ref Rng & Units 12/12/2022  Oncology Labs  LDH 98 - 192 U/L 123    STUDIES:  IR IMAGING GUIDED PORT INSERTION Result Date: 08/08/2023 INDICATION: 47 year old female with history of melanoma on immunotherapy via a right internal jugular Port-A-Cath placed at [facility] off hospital. The patient presents with port malfunction with port injection demonstrating catheter fracture. The patient presents today for port revision. EXAM: 1. IMPLANTED PORT A CATH PLACEMENT WITH ULTRASOUND AND FLUOROSCOPIC GUIDANCE 2. Port removal. COMPARISON:  None Available. MEDICATIONS: None. ANESTHESIA/SEDATION: Moderate (conscious) sedation was employed during this procedure. A total of Versed 4 mg and Fentanyl 200 mcg was administered intravenously. Moderate Sedation Time: 27 minutes. The patient's level of consciousness and vital signs were  monitored continuously by radiology nursing throughout the procedure under  my direct supervision. CONTRAST:  None FLUOROSCOPY TIME:  Two mGy COMPLICATIONS: None immediate. PROCEDURE: The procedure, risks, benefits, and alternatives were explained to the patient. Questions regarding the procedure were encouraged and answered. The patient understands and consents to the procedure. The right neck and chest were prepped with chlorhexidine in a sterile fashion, and a sterile drape was applied covering the operative field. Maximum barrier sterile technique with sterile gowns and gloves were used for the procedure. A timeout was performed prior to the initiation of the procedure. Ultrasound was used to examine the jugular vein which was compressible and free of internal echoes. A skin marker was used to demarcate the planned venotomy and port pocket incision sites. Local anesthesia was provided to these sites and the subcutaneous tunnel track with 1% lidocaine with 1:100,000 epinephrine. A small incision was created at the jugular access site and blunt dissection was performed of the subcutaneous tissues. Under ultrasound guidance, the jugular vein was accessed with a 21 ga micropuncture needle and an 0.018" wire was inserted to the superior vena cava. Real-time ultrasound guidance was utilized for vascular access including the acquisition of a permanent ultrasound image documenting patency of the accessed vessel. A 5 Fr micopuncture set was then used, through which a 0.035" Rosen wire was passed under fluoroscopic guidance into the inferior vena cava. An 8 Fr dilator was then placed over the wire. Local anesthetic was administered about the incision of the previously placed, indwelling Port-A-Cath. Incision was created with a 15 blade and using a combination of blunt and sharp dissection, the Port-A-Cath was removed intact. A new subcutaneous port pocket was then created along the upper chest wall utilizing a combination  of sharp and blunt dissection. The pocket was irrigated with sterile saline, packed with gauze, and observed for hemorrhage. A single lumen "ISP" sized power injectable port was chosen for placement. The 8 Fr catheter was tunneled from the port pocket site to the venotomy incision. The port was placed in the pocket. The external catheter was trimmed to appropriate length. The dilator was exchanged for an 8 Fr peel-away sheath under fluoroscopic guidance. The catheter was then placed through the sheath and the sheath was removed. Final catheter positioning was confirmed and documented with a fluoroscopic spot radiograph. The port was accessed with a Huber needle, aspirated, and flushed with heparinized saline. The deep dermal layer of both of the port pocket incisions was closed with interrupted 3-0 Vicryl suture. The new port skin incision was opposed with a running subcuticular 4-0 Monocryl suture. Dermabond was then placed over the port pockets and neck incisions. The patient tolerated the procedure well without immediate post procedural complication. FINDINGS: After catheter placement, the tip lies within the superior cavoatrial junction. The catheter aspirates and flushes normally and is ready for immediate use. IMPRESSION: 1. Successful placement of a power injectable Port-A-Cath via the right internal jugular vein. The catheter is ready for immediate use. 2. Successful removal of indwelling, fractured Port-A-Cath. Marliss Coots, MD [facility] Radiology Electronically Signed   By: Marliss Coots M.D.   On: 08/08/2023 10:51   IR REMOVAL TUN ACCESS W/ PORT W/O FL MOD SED Result Date: 08/08/2023 INDICATION: 47 year old female with history of melanoma on immunotherapy via a right internal jugular Port-A-Cath placed at [facility] off hospital. The patient presents with port malfunction with port injection demonstrating catheter fracture. The patient presents today for  port revision. EXAM: 1. IMPLANTED PORT A CATH PLACEMENT WITH ULTRASOUND AND FLUOROSCOPIC  GUIDANCE 2. Port removal. COMPARISON:  None Available. MEDICATIONS: None. ANESTHESIA/SEDATION: Moderate (conscious) sedation was employed during this procedure. A total of Versed 4 mg and Fentanyl 200 mcg was administered intravenously. Moderate Sedation Time: 27 minutes. The patient's level of consciousness and vital signs were monitored continuously by radiology nursing throughout the procedure under my direct supervision. CONTRAST:  None FLUOROSCOPY TIME:  Two mGy COMPLICATIONS: None immediate. PROCEDURE: The procedure, risks, benefits, and alternatives were explained to the patient. Questions regarding the procedure were encouraged and answered. The patient understands and consents to the procedure. The right neck and chest were prepped with chlorhexidine in a sterile fashion, and a sterile drape was applied covering the operative field. Maximum barrier sterile technique with sterile gowns and gloves were used for the procedure. A timeout was performed prior to the initiation of the procedure. Ultrasound was used to examine the jugular vein which was compressible and free of internal echoes. A skin marker was used to demarcate the planned venotomy and port pocket incision sites. Local anesthesia was provided to these sites and the subcutaneous tunnel track with 1% lidocaine with 1:100,000 epinephrine. A small incision was created at the jugular access site and blunt dissection was performed of the subcutaneous tissues. Under ultrasound guidance, the jugular vein was accessed with a 21 ga micropuncture needle and an 0.018" wire was inserted to the superior vena cava. Real-time ultrasound guidance was utilized for vascular access including the acquisition of a permanent ultrasound image documenting patency of the accessed vessel. A 5 Fr micopuncture set was then used, through which a 0.035" Rosen wire was passed under  fluoroscopic guidance into the inferior vena cava. An 8 Fr dilator was then placed over the wire. Local anesthetic was administered about the incision of the previously placed, indwelling Port-A-Cath. Incision was created with a 15 blade and using a combination of blunt and sharp dissection, the Port-A-Cath was removed intact. A new subcutaneous port pocket was then created along the upper chest wall utilizing a combination of sharp and blunt dissection. The pocket was irrigated with sterile saline, packed with gauze, and observed for hemorrhage. A single lumen "ISP" sized power injectable port was chosen for placement. The 8 Fr catheter was tunneled from the port pocket site to the venotomy incision. The port was placed in the pocket. The external catheter was trimmed to appropriate length. The dilator was exchanged for an 8 Fr peel-away sheath under fluoroscopic guidance. The catheter was then placed through the sheath and the sheath was removed. Final catheter positioning was confirmed and documented with a fluoroscopic spot radiograph. The port was accessed with a Huber needle, aspirated, and flushed with heparinized saline. The deep dermal layer of both of the port pocket incisions was closed with interrupted 3-0 Vicryl suture. The new port skin incision was opposed with a running subcuticular 4-0 Monocryl suture. Dermabond was then placed over the port pockets and neck incisions. The patient tolerated the procedure well without immediate post procedural complication. FINDINGS: After catheter placement, the tip lies within the superior cavoatrial junction. The catheter aspirates and flushes normally and is ready for immediate use. IMPRESSION: 1. Successful placement of a power injectable Port-A-Cath via the right internal jugular vein. The catheter is ready for immediate use. 2. Successful removal of indwelling, fractured Port-A-Cath. Marliss Coots, MD [facility]  Radiology Electronically Signed   By: Marliss Coots M.D.   On: 08/08/2023 10:51   IR CV Line Injection Result Date: 08/04/2023 INDICATION:  Poor blood return on Port. History of melanoma on immunotherapy. Surgically-placed port catheter inserted 01/11/2023 at [facility] EXAM: FLUOROSCOPIC GUIDED PORT A CATHETER INJECTION COMPARISON:  Chest XR, 02/23/2023.  PET-CT, 06/26/2023. MEDICATIONS: None ANESTHESIA/SEDATION: None CONTRAST:  10 mL Isovue-300 FLUOROSCOPY TIME:  Fluoroscopic dose; 1 mGy COMPLICATIONS: None immediate. PROCEDURE: The patient's RIGHT anterior chest wall subclavian vein approach port a catheter was accessed under sterile precautions. The patient was placed supine on the fluoroscopy table. A preprocedural spot radiographic image was obtained of the existing RIGHT jugular-approach port a catheter with tip overlying the proximal aspect of the SVC. Port a Catheter with noted difficulty to aspirate. Multiple spot fluoroscopic and angiographic images were obtained in various obliquities following the injection of a small amount of contrast via the Port a catheter. Images were reviewed and the procedure was terminated. The port was de-accessed. A dressing was placed. The patient tolerated the procedure well without immediate postprocedural complication. FINDINGS: RIGHT jugular-approach port catheter with tip projecting of the proximal SVC. Kink at the RIGHT neck with catheter fracture and POSITIVE contrast extravasation into the soft tissues. See key image IMPRESSION: Examination is POSITIVE for kink and catheter fracture with contrast extravasation into the soft tissues. DO NOT USE RIGHT chest port. RECOMMENDATIONS: The patient will return to Interventional Radiology for port catheter removal and replacement. Roanna Banning, MD [facility] Radiology Electronically Signed   By: Roanna Banning M.D.   On: 08/04/2023 17:32      ASSESSMENT & PLAN:    Assessment/Plan:  47 y.o. female with stage IIIC melanoma, status post a wide local excision and sentinel lymph node biopsy in March 2024.  Prior to her 11th cycle of pembrolizumab, she had been on prednisone 30 mg daily.  She was weaned off the prednisone rapidly last month so it will not offset the immune effects from her pembrolizumab immunotherapy.  She proceeded with her 11th cycle of pembrolizumab after the steroid taper.  She still has intermittent pruritus and skin rash improved with Benadryl..  I will give her benadryl and famotidine prior to each treatment to help with her rash. She will proceed with her 12th cycle of pembrolizumab today. With respect to the skin lesion on her leg, it is very suspicious for recurrence. Unfortunately she missed her appointment with her dermatologist due to work and is rescheduled next month. I contacted Dr. Francee Nodal office to see if he could see the patient as soon as possible for his recommendations. Otherwise, I will see this patient back in 3 weeks before she heads into her 13th cycle of adjuvant pembrolizumab immunotherapy.  The patient understands all the plans discussed today and is in agreement with them.  She knows to contact our office if she develops concerns prior to her next appointment.     Adah Perl, PA-C   [facility] - A DEPT OF [facility] [address] [place] [address] Dept: [phone] Dept Fax: [phone]     I,Jasmine M Lassiter,acting as a scribe for Adah Perl, PA-C.,have documented all relevant documentation on the behalf of Adah Perl, PA-C,as directed by  Adah Perl, PA-C while in the presence of Adah Perl, PA-C.

## 2023-09-01 ENCOUNTER — Inpatient Hospital Stay: Payer: 59

## 2023-09-01 ENCOUNTER — Other Ambulatory Visit: Payer: 59

## 2023-09-01 ENCOUNTER — Encounter: Payer: Self-pay | Admitting: Oncology

## 2023-09-01 ENCOUNTER — Ambulatory Visit: Payer: 59

## 2023-09-01 ENCOUNTER — Other Ambulatory Visit: Payer: Self-pay | Admitting: Oncology

## 2023-09-01 ENCOUNTER — Telehealth: Payer: Self-pay

## 2023-09-01 ENCOUNTER — Inpatient Hospital Stay (HOSPITAL_BASED_OUTPATIENT_CLINIC_OR_DEPARTMENT_OTHER): Payer: 59 | Admitting: Hematology and Oncology

## 2023-09-01 ENCOUNTER — Encounter: Payer: Self-pay | Admitting: Hematology and Oncology

## 2023-09-01 ENCOUNTER — Inpatient Hospital Stay: Payer: 59 | Attending: Oncology

## 2023-09-01 ENCOUNTER — Ambulatory Visit: Payer: 59 | Admitting: Oncology

## 2023-09-01 ENCOUNTER — Telehealth: Payer: Self-pay | Admitting: Hematology and Oncology

## 2023-09-01 VITALS — BP 146/98 | HR 80 | Temp 97.8°F | Resp 20 | Ht 65.0 in | Wt 313.6 lb

## 2023-09-01 DIAGNOSIS — C4371 Malignant melanoma of right lower limb, including hip: Secondary | ICD-10-CM

## 2023-09-01 DIAGNOSIS — Z5112 Encounter for antineoplastic immunotherapy: Secondary | ICD-10-CM | POA: Diagnosis not present

## 2023-09-01 DIAGNOSIS — Z79899 Other long term (current) drug therapy: Secondary | ICD-10-CM | POA: Diagnosis not present

## 2023-09-01 LAB — CBC WITH DIFFERENTIAL (CANCER CENTER ONLY)
Abs Immature Granulocytes: 0.03 10*3/uL (ref 0.00–0.07)
Basophils Absolute: 0.1 10*3/uL (ref 0.0–0.1)
Basophils Relative: 1 %
Eosinophils Absolute: 0.3 10*3/uL (ref 0.0–0.5)
Eosinophils Relative: 3 %
HCT: 35.7 % — ABNORMAL LOW (ref 36.0–46.0)
Hemoglobin: 12.5 g/dL (ref 12.0–15.0)
Immature Granulocytes: 0 %
Lymphocytes Relative: 19 %
Lymphs Abs: 1.5 10*3/uL (ref 0.7–4.0)
MCH: 29.6 pg (ref 26.0–34.0)
MCHC: 35 g/dL (ref 30.0–36.0)
MCV: 84.6 fL (ref 80.0–100.0)
Monocytes Absolute: 0.5 10*3/uL (ref 0.1–1.0)
Monocytes Relative: 7 %
Neutro Abs: 5.2 10*3/uL (ref 1.7–7.7)
Neutrophils Relative %: 70 %
Platelet Count: 237 10*3/uL (ref 150–400)
RBC: 4.22 MIL/uL (ref 3.87–5.11)
RDW: 13.8 % (ref 11.5–15.5)
WBC Count: 7.6 10*3/uL (ref 4.0–10.5)
nRBC: 0 % (ref 0.0–0.2)
nRBC: 0 /100{WBCs}

## 2023-09-01 LAB — CMP (CANCER CENTER ONLY)
ALT: 17 U/L (ref 0–44)
AST: 19 U/L (ref 15–41)
Albumin: 3.6 g/dL (ref 3.5–5.0)
Alkaline Phosphatase: 94 U/L (ref 38–126)
Anion gap: 11 (ref 5–15)
BUN: 16 mg/dL (ref 6–20)
CO2: 25 mmol/L (ref 22–32)
Calcium: 9.3 mg/dL (ref 8.9–10.3)
Chloride: 103 mmol/L (ref 98–111)
Creatinine: 0.83 mg/dL (ref 0.44–1.00)
GFR, Estimated: >60 mL/min (ref >60)
Glucose, Bld: 134 mg/dL — ABNORMAL HIGH (ref 70–99)
Potassium: 3.8 mmol/L (ref 3.5–5.1)
Sodium: 139 mmol/L (ref 135–145)
Total Bilirubin: 0.4 mg/dL (ref <1.2)
Total Protein: 6.2 g/dL — ABNORMAL LOW (ref 6.5–8.1)

## 2023-09-01 LAB — TSH: TSH: 2.102 u[IU]/mL (ref 0.350–4.500)

## 2023-09-01 MED ORDER — SODIUM CHLORIDE 0.9% FLUSH
10.0000 mL | INTRAVENOUS | Status: DC | PRN
  Administered 2023-09-01: 10 mL

## 2023-09-01 MED ORDER — SODIUM CHLORIDE 0.9 % IV SOLN
200.0000 mg | Freq: Once | INTRAVENOUS | Status: AC
  Administered 2023-09-01: 200 mg via INTRAVENOUS
  Filled 2023-09-01: qty 8

## 2023-09-01 MED ORDER — FAMOTIDINE IN NACL 20-0.9 MG/50ML-% IV SOLN
20.0000 mg | Freq: Once | INTRAVENOUS | Status: AC
  Administered 2023-09-01: 20 mg via INTRAVENOUS
  Filled 2023-09-01: qty 50

## 2023-09-01 MED ORDER — SODIUM CHLORIDE 0.9 % IV SOLN: Freq: Once | INTRAVENOUS | Status: AC

## 2023-09-01 MED ORDER — HEPARIN SOD (PORK) LOCK FLUSH 100 UNIT/ML IV SOLN
500.0000 [IU] | Freq: Once | INTRAVENOUS | Status: AC | PRN
  Administered 2023-09-01: 500 [IU]

## 2023-09-01 MED ORDER — DIPHENHYDRAMINE HCL 50 MG/ML IJ SOLN
25.0000 mg | Freq: Once | INTRAMUSCULAR | Status: AC
  Administered 2023-09-01: 25 mg via INTRAVENOUS
  Filled 2023-09-01: qty 1

## 2023-09-01 MED ORDER — FAMOTIDINE IN NACL 20-0.9 MG/50ML-% IV SOLN
20.0000 mg | Freq: Once | INTRAVENOUS | Status: AC
Start: 2023-09-01 — End: 2023-09-01
  Administered 2023-09-01: 20 mg via INTRAVENOUS
  Filled 2023-09-01: qty 50

## 2023-09-01 NOTE — Telephone Encounter (Signed)
Patient has been scheduled for follow-up visit per 09/01/23 LOS.  Pt given an appt calendar with date and time.

## 2023-09-01 NOTE — Telephone Encounter (Signed)
-----   Message from Diana Odom sent at 09/01/2023  8:57 AM EST ----- Please ask dermatology to see asap. Patient has progressive skin lesion right lower extremity adjacent to surgical site concerning for recurrent melanoma. She has to miss appt this month due to work, rescheduled to later next month. Thanks

## 2023-09-01 NOTE — Telephone Encounter (Signed)
Disregard previous message Diana Odom spoke with Dr Theone Stanley office and patient will follow up with them, [facility] spoke with patient concerning this. Patient will call to set up appt at her convenience.

## 2023-09-01 NOTE — Patient Instructions (Signed)
 [facility] - A DEPT OF [facility]  Discharge Instructions: Thank you for choosing [facility] to provide your oncology and hematology care.  If you have a lab appointment with the Cancer Center, please go directly to the Cancer Center and check in at the registration area.   Wear comfortable clothing and clothing appropriate for easy access to any Portacath or PICC line.   We strive to give you quality time with your provider. You may need to reschedule your appointment if you arrive late (15 or more minutes).  Arriving late affects you and other patients whose appointments are after yours.  Also, if you miss three or more appointments without notifying the office, you may be dismissed from the clinic at the provider's discretion.      For prescription refill requests, have your pharmacy contact our office and allow 72 hours for refills to be completed.    Today you received the following chemotherapy and/or immunotherapy agents Keytruda      To help prevent nausea and vomiting after your treatment, we encourage you to take your nausea medication as directed.  BELOW ARE SYMPTOMS THAT SHOULD BE REPORTED IMMEDIATELY: *FEVER GREATER THAN 100.4 F (38 C) OR HIGHER *CHILLS OR SWEATING *NAUSEA AND VOMITING THAT IS NOT CONTROLLED WITH YOUR NAUSEA MEDICATION *UNUSUAL SHORTNESS OF BREATH *UNUSUAL BRUISING OR BLEEDING *URINARY PROBLEMS (pain or burning when urinating, or frequent urination) *BOWEL PROBLEMS (unusual diarrhea, constipation, pain near the anus) TENDERNESS IN MOUTH AND THROAT WITH OR WITHOUT PRESENCE OF ULCERS (sore throat, sores in mouth, or a toothache) UNUSUAL RASH, SWELLING OR PAIN  UNUSUAL VAGINAL DISCHARGE OR ITCHING   Items with * indicate a potential emergency and should be followed up as soon as possible or go to the Emergency Department if any problems should occur.  Please show the CHEMOTHERAPY ALERT CARD or IMMUNOTHERAPY ALERT  CARD at check-in to the Emergency Department and triage nurse.  Should you have questions after your visit or need to cancel or reschedule your appointment, please contact [facility] [place] - [facility]  Dept: [phone]  and follow the prompts.  Office hours are 8:00 a.m. to 4:30 p.m. Monday - Friday. Please note that voicemails left after 4:00 p.m. may not be returned until the following business day.  We are closed weekends and major holidays. You have access to a nurse at all times for urgent questions. Please call the main number to the clinic Dept: [phone] and follow the prompts.  For any non-urgent questions, you may also contact your provider using MyChart. We now offer e-Visits for anyone 72 and older to request care online for non-urgent symptoms. For details visit [email or web address].   Also download the MyChart app! Go to the app store, search "MyChart", open the app, select [place], and log in with your MyChart username and password.

## 2023-09-03 LAB — T4: T4, Total: 7.7 ug/dL (ref 4.5–12.0)

## 2023-09-04 DIAGNOSIS — C792 Secondary malignant neoplasm of skin: Secondary | ICD-10-CM | POA: Diagnosis not present

## 2023-09-04 DIAGNOSIS — C439 Malignant melanoma of skin, unspecified: Secondary | ICD-10-CM | POA: Diagnosis not present

## 2023-09-04 DIAGNOSIS — D485 Neoplasm of uncertain behavior of skin: Secondary | ICD-10-CM | POA: Diagnosis not present

## 2023-09-04 DIAGNOSIS — C4371 Malignant melanoma of right lower limb, including hip: Secondary | ICD-10-CM | POA: Diagnosis not present

## 2023-09-08 NOTE — Addendum Note (Signed)
Addended by: Glynda Jaeger on: 09/08/2023 01:37 PM   Modules accepted: Orders

## 2023-09-26 ENCOUNTER — Encounter: Payer: Self-pay | Admitting: Oncology

## 2023-09-26 DIAGNOSIS — C439 Malignant melanoma of skin, unspecified: Secondary | ICD-10-CM | POA: Diagnosis not present

## 2023-09-26 DIAGNOSIS — R918 Other nonspecific abnormal finding of lung field: Secondary | ICD-10-CM | POA: Diagnosis not present

## 2023-09-26 DIAGNOSIS — C4371 Malignant melanoma of right lower limb, including hip: Secondary | ICD-10-CM | POA: Diagnosis not present

## 2023-09-26 DIAGNOSIS — R590 Localized enlarged lymph nodes: Secondary | ICD-10-CM | POA: Diagnosis not present

## 2023-09-26 DIAGNOSIS — R9389 Abnormal findings on diagnostic imaging of other specified body structures: Secondary | ICD-10-CM | POA: Diagnosis not present

## 2023-09-26 NOTE — Progress Notes (Deleted)
 [facility]  [address] [place],  [place]  [address] [phone]  Clinic Day:  09/26/2023  Referring physician: Melonie Colonel, Irma M, *  HISTORY OF PRESENT ILLNESS:  The patient is a 48 y.o. female with stage IIIC (T4b N1a M0) melanoma, status post a wide local excision and right inguinal sentinel lymph node biopsy in March 2024.  Due to her melanoma staging, she is receiving adjuvant pembrolizumab  immunotherapy.  She comes in today to be evaluated before heading into her 13th cycle of treatment.  The patient claims to have tolerated her 11th cycle of pembrolizumab  fairly well.  She reports persistent itchy rash which improves with benadryl . She requested a to receive benadryl  and famotidine  during treatment as she felt this helped with the itching and rash with her last cycle.  She also requests a handicap placard as she has been experiencing increased fatigue.  She also reports mild shortness of breath on exertion.  She denies cough, chest pain, palpitations or lightheadedness.  She denies fevers, chills, or night sweats.  She states the lesion adjacent to her surgical site in her right leg has enlarged.  She was scheduled with her dermatologist this month, but missed the appointment due to work.  She is scheduled to see dermatology towards the end of the month.  PHYSICAL EXAM:  There were no vitals taken for this visit. Wt Readings from Last 3 Encounters:  09/01/23 (!) 313 lb 9.6 oz (142.2 kg)  08/08/23 (!) 314 lb 6.4 oz (142.6 kg)  08/02/23 (!) 320 lb (145.2 kg)   There is no height or weight on file to calculate BMI.  Performance status (ECOG): 1 - Symptomatic but completely ambulatory  Physical Exam Vitals and nursing note reviewed.  Constitutional:      General: She is not in acute distress.    Appearance: She is obese. She is not ill-appearing.  HENT:     Head: Normocephalic and atraumatic.     Mouth/Throat:     Mouth: Mucous membranes are moist.      Pharynx: Oropharynx is clear. No oropharyngeal exudate or posterior oropharyngeal erythema.  Eyes:     General: No scleral icterus.    Extraocular Movements: Extraocular movements intact.     Conjunctiva/sclera: Conjunctivae normal.     Pupils: Pupils are equal, round, and reactive to light.  Cardiovascular:     Rate and Rhythm: Normal rate and regular rhythm.     Heart sounds: Normal heart sounds. No murmur heard.    No friction rub. No gallop.  Pulmonary:     Effort: Pulmonary effort is normal.     Breath sounds: Normal breath sounds. No wheezing, rhonchi or rales.  Abdominal:     General: Abdomen is protuberant. There is no distension.     Palpations: Abdomen is soft. There is no mass.     Tenderness: There is no abdominal tenderness.     Comments: Difficult to assess for hepatosplenomegaly due to body habitus  Musculoskeletal:        General: Normal range of motion.     Cervical back: Normal range of motion and neck supple. No tenderness.     Right lower leg: No edema.     Left lower leg: No edema.  Lymphadenopathy:     Cervical: No cervical adenopathy.     Upper Body:     Right upper body: No supraclavicular or axillary adenopathy.     Left upper body: No supraclavicular or axillary adenopathy.  Lower Body: No right inguinal adenopathy. No left inguinal adenopathy.  Skin:    General: Skin is warm and dry.     Coloration: Skin is not jaundiced.     Findings: No rash.     Comments: Lesion of the right lower extremity adjacent to her surgical scar is at least 5mm in greatest dimension and now nodular.  There is no current skin rash.  Neurological:     Mental Status: She is alert and oriented to person, place, and time.     Cranial Nerves: No cranial nerve deficit.  Psychiatric:        Mood and Affect: Mood normal.        Behavior: Behavior normal.        Thought Content: Thought content normal.    LABS:      Latest Ref Rng & Units 09/01/2023    8:17 AM 08/02/2023    11:48 AM 07/10/2023    3:32 PM  CBC  WBC 4.0 - 10.5 K/uL 7.6  15.8  20.5   Hemoglobin 12.0 - 15.0 g/dL 87.4  87.0  86.4   Hematocrit 36.0 - 46.0 % 35.7  38.7  41.9   Platelets 150 - 400 K/uL 237  319  314       Latest Ref Rng & Units 09/01/2023    8:17 AM 08/02/2023   11:48 AM 07/10/2023    3:32 PM  CMP  Glucose 70 - 99 mg/dL 865  92  861   BUN 6 - 20 mg/dL 16  12  18    Creatinine 0.44 - 1.00 mg/dL 9.16  9.00  9.11   Sodium 135 - 145 mmol/L 139  140  134   Potassium 3.5 - 5.1 mmol/L 3.8  3.9  4.1   Chloride 98 - 111 mmol/L 103  103  98   CO2 22 - 32 mmol/L 25  27  26    Calcium 8.9 - 10.3 mg/dL 9.3  9.5  9.4   Total Protein 6.5 - 8.1 g/dL 6.2  6.4  7.3   Total Bilirubin <1.2 mg/dL 0.4  0.3  0.3   Alkaline Phos 38 - 126 U/L 94  99  99   AST 15 - 41 U/L 19  16  16    ALT 0 - 44 U/L 17  19  20      Lab Results  Component Value Date   LDH 123 12/12/2022       Component Value Date/Time   LDH 123 12/12/2022 1515    Review Flowsheet       Latest Ref Rng & Units 12/12/2022  Oncology Labs  LDH 98 - 192 U/L 123    STUDIES:  No results found.     ASSESSMENT & PLAN:   Assessment/Plan:  48 y.o. female with stage IIIC melanoma, status post a wide local excision and sentinel lymph node biopsy in March 2024.  Prior to her 11th cycle of pembrolizumab , she had been on prednisone  30 mg daily.  She was weaned off the prednisone  rapidly last month so it will not offset the immune effects from her pembrolizumab  immunotherapy.  She proceeded with her 11th cycle of pembrolizumab  after the steroid taper.  She still has intermittent pruritus and skin rash improved with Benadryl ..  I will give her benadryl  and famotidine  prior to each treatment to help with her rash. She will proceed with her 12th cycle of pembrolizumab  today. With respect to the skin lesion on her leg, it is very  suspicious for recurrence. Unfortunately she missed her appointment with her dermatologist due to work and is  rescheduled next month. I contacted Dr. Trecia office to see if he could see the patient as soon as possible for his recommendations. Otherwise, I will see this patient back in 3 weeks before she heads into her 13th cycle of adjuvant pembrolizumab  immunotherapy.  The patient understands all the plans discussed today and is in agreement with them.  She knows to contact our office if she develops concerns prior to her next appointment.     Kelli A Mosher, PA-C   [facility] - A DEPT OF [facility] [address] [place] [address] Dept: [phone] Dept Fax: [phone]     I,Jasmine M Lassiter,acting as a scribe for Rainna Nearhood DELENA Kerns, MD.,have documented all relevant documentation on the behalf of Brittane Grudzinski DELENA Kerns, MD,as directed by  Valaria DELENA Kerns, MD while in the presence of Nayeli Calvert DELENA Kerns, MD.

## 2023-09-27 ENCOUNTER — Inpatient Hospital Stay: Payer: 59

## 2023-09-27 ENCOUNTER — Inpatient Hospital Stay: Payer: 59 | Admitting: Oncology

## 2023-09-28 ENCOUNTER — Other Ambulatory Visit: Payer: Self-pay

## 2023-10-03 ENCOUNTER — Other Ambulatory Visit: Payer: Self-pay | Admitting: Dermatology

## 2023-10-03 ENCOUNTER — Encounter: Payer: Self-pay | Admitting: Hematology and Oncology

## 2023-10-03 ENCOUNTER — Inpatient Hospital Stay: Payer: 59 | Admitting: Hematology and Oncology

## 2023-10-03 ENCOUNTER — Encounter: Payer: Self-pay | Admitting: Dermatology

## 2023-10-03 ENCOUNTER — Ambulatory Visit (INDEPENDENT_AMBULATORY_CARE_PROVIDER_SITE_OTHER): Payer: 59 | Admitting: Dermatology

## 2023-10-03 ENCOUNTER — Inpatient Hospital Stay: Payer: 59 | Attending: Oncology

## 2023-10-03 ENCOUNTER — Inpatient Hospital Stay: Payer: 59

## 2023-10-03 ENCOUNTER — Encounter: Payer: Self-pay | Admitting: Oncology

## 2023-10-03 VITALS — BP 161/97 | HR 85 | Temp 98.7°F | Resp 18 | Ht 65.0 in | Wt 305.1 lb

## 2023-10-03 DIAGNOSIS — L570 Actinic keratosis: Secondary | ICD-10-CM

## 2023-10-03 DIAGNOSIS — L820 Inflamed seborrheic keratosis: Secondary | ICD-10-CM

## 2023-10-03 DIAGNOSIS — L814 Other melanin hyperpigmentation: Secondary | ICD-10-CM | POA: Diagnosis not present

## 2023-10-03 DIAGNOSIS — Z85820 Personal history of malignant melanoma of skin: Secondary | ICD-10-CM

## 2023-10-03 DIAGNOSIS — Z79620 Long term (current) use of immunosuppressive biologic: Secondary | ICD-10-CM | POA: Diagnosis not present

## 2023-10-03 DIAGNOSIS — C4371 Malignant melanoma of right lower limb, including hip: Secondary | ICD-10-CM | POA: Diagnosis not present

## 2023-10-03 DIAGNOSIS — W908XXA Exposure to other nonionizing radiation, initial encounter: Secondary | ICD-10-CM | POA: Diagnosis not present

## 2023-10-03 DIAGNOSIS — D229 Melanocytic nevi, unspecified: Secondary | ICD-10-CM

## 2023-10-03 DIAGNOSIS — D485 Neoplasm of uncertain behavior of skin: Secondary | ICD-10-CM

## 2023-10-03 DIAGNOSIS — D1801 Hemangioma of skin and subcutaneous tissue: Secondary | ICD-10-CM | POA: Diagnosis not present

## 2023-10-03 DIAGNOSIS — D492 Neoplasm of unspecified behavior of bone, soft tissue, and skin: Secondary | ICD-10-CM

## 2023-10-03 DIAGNOSIS — L578 Other skin changes due to chronic exposure to nonionizing radiation: Secondary | ICD-10-CM | POA: Diagnosis not present

## 2023-10-03 DIAGNOSIS — L821 Other seborrheic keratosis: Secondary | ICD-10-CM | POA: Diagnosis not present

## 2023-10-03 LAB — CBC WITH DIFFERENTIAL (CANCER CENTER ONLY)
Abs Immature Granulocytes: 0.05 10*3/uL (ref 0.00–0.07)
Basophils Absolute: 0.1 10*3/uL (ref 0.0–0.1)
Basophils Relative: 1 %
Eosinophils Absolute: 0.2 10*3/uL (ref 0.0–0.5)
Eosinophils Relative: 2 %
HCT: 36.4 % (ref 36.0–46.0)
Hemoglobin: 12.5 g/dL (ref 12.0–15.0)
Immature Granulocytes: 1 %
Lymphocytes Relative: 13 %
Lymphs Abs: 1.4 10*3/uL (ref 0.7–4.0)
MCH: 28.8 pg (ref 26.0–34.0)
MCHC: 34.3 g/dL (ref 30.0–36.0)
MCV: 83.9 fL (ref 80.0–100.0)
Monocytes Absolute: 0.7 10*3/uL (ref 0.1–1.0)
Monocytes Relative: 7 %
Neutro Abs: 8.1 10*3/uL — ABNORMAL HIGH (ref 1.7–7.7)
Neutrophils Relative %: 76 %
Platelet Count: 263 10*3/uL (ref 150–400)
RBC: 4.34 MIL/uL (ref 3.87–5.11)
RDW: 13 % (ref 11.5–15.5)
WBC Count: 10.6 10*3/uL — ABNORMAL HIGH (ref 4.0–10.5)
nRBC: 0 % (ref 0.0–0.2)
nRBC: 0 /100{WBCs}

## 2023-10-03 LAB — CMP (CANCER CENTER ONLY)
ALT: 17 U/L (ref 0–44)
AST: 17 U/L (ref 15–41)
Albumin: 3.8 g/dL (ref 3.5–5.0)
Alkaline Phosphatase: 115 U/L (ref 38–126)
Anion gap: 11 (ref 5–15)
BUN: 11 mg/dL (ref 6–20)
CO2: 25 mmol/L (ref 22–32)
Calcium: 9.4 mg/dL (ref 8.9–10.3)
Chloride: 105 mmol/L (ref 98–111)
Creatinine: 0.8 mg/dL (ref 0.44–1.00)
GFR, Estimated: >60 mL/min (ref >60)
Glucose, Bld: 98 mg/dL (ref 70–99)
Potassium: 3.7 mmol/L (ref 3.5–5.1)
Sodium: 141 mmol/L (ref 135–145)
Total Bilirubin: 0.5 mg/dL (ref 0.0–1.2)
Total Protein: 6.4 g/dL — ABNORMAL LOW (ref 6.5–8.1)

## 2023-10-03 LAB — TSH: TSH: 1.591 u[IU]/mL (ref 0.350–4.500)

## 2023-10-03 MED ORDER — CLOBETASOL PROPIONATE 0.05 % EX SOLN: 1.0000 | Freq: Two times a day (BID) | CUTANEOUS | 3 refills | Status: DC

## 2023-10-03 MED ORDER — TRIAMCINOLONE ACETONIDE 0.1 % EX OINT: 1.0000 | TOPICAL_OINTMENT | Freq: Every day | CUTANEOUS | 3 refills | Status: DC | PRN

## 2023-10-03 NOTE — Progress Notes (Signed)
   New Patient Visit   Subjective  Diana Odom is a 48 y.o. female who presents for the following: Skin Cancer Screening and Full Body Skin Exam. Hx of MM and is being treated for metastatic melanoma, initial lesion found on the right lower leg.  The patient presents for Total-Body Skin Exam (TBSE) for skin cancer screening and mole check. The patient has spots, moles and lesions to be evaluated, some may be new or changing.  The following portions of the chart were reviewed this encounter and updated as appropriate: medications, allergies, medical history  Review of Systems:  No other skin or systemic complaints except as noted in HPI or Assessment and Plan.  Objective  Well appearing patient in no apparent distress; mood and affect are within normal limits.  A full examination was performed including scalp, head, eyes, ears, nose, lips, neck, chest, axillae, abdomen, back, buttocks, bilateral upper extremities, bilateral lower extremities, hands, feet, fingers, toes, fingernails, and toenails. All findings within normal limits unless otherwise noted below.   Relevant physical exam findings are noted in the Assessment and Plan.  Left Upper Back Pink gritty scaly papule  Assessment & Plan   SKIN CANCER SCREENING PERFORMED TODAY.  ACTINIC DAMAGE - Chronic condition, secondary to cumulative UV/sun exposure - diffuse scaly erythematous macules with underlying dyspigmentation - Recommend daily broad spectrum sunscreen SPF 30+ to sun-exposed areas, reapply every 2 hours as needed.  - Staying in the shade or wearing long sleeves, sun glasses (UVA+UVB protection) and wide brim hats (4-inch brim around the entire circumference of the hat) are also recommended for sun protection.  - Call for new or changing lesions.  LENTIGINES, SEBORRHEIC KERATOSES, HEMANGIOMAS - Benign normal skin lesions - Benign-appearing - Call for any changes  MELANOCYTIC NEVI - Tan-brown and/or  pink-flesh-colored symmetric macules and papules - Benign appearing on exam today - Observation - Call clinic for new or changing moles - Recommend daily use of broad spectrum spf 30+ sunscreen to sun-exposed areas.   HISTORY OF MELANOMA - Currently undergoing tx for metastatic melanoma - Recommend regular full body skin exams - Recommend daily broad spectrum sunscreen SPF 30+ to sun-exposed areas, reapply every 2 hours as needed.  - Call if any new or changing lesions are noted between office visits  NEOPLASM OF UNCERTAIN BEHAVIOR OF SKIN Left Upper Back Skin / nail biopsy Type of biopsy: tangential   Informed consent: discussed and consent obtained   Timeout: patient name, date of birth, surgical site, and procedure verified   Anesthesia: the lesion was anesthetized in a standard fashion   Anesthetic:  1% lidocaine  w/ epinephrine  1-100,000 buffered w/ 8.4% NaHCO3 Instrument used: DermaBlade   Hemostasis achieved with: aluminum chloride   Outcome: patient tolerated procedure well   Post-procedure details: sterile dressing applied and wound care instructions given   Dressing type: bandage and petrolatum   ACTINIC KERATOSIS   LENTIGINES   SEBORRHEIC KERATOSES   CHERRY ANGIOMA   MULTIPLE BENIGN NEVI    Return in about 3 months (around 01/01/2024) for TBSE.   Documentation: I have reviewed the above documentation for accuracy and completeness, and I agree with the above.  RUFUS CHRISTELLA HOLY, MD

## 2023-10-03 NOTE — Patient Instructions (Signed)
 Important Information  Due to recent changes in healthcare laws, you may see results of your pathology and/or laboratory studies on MyChart before the doctors have had a chance to review them. We understand that in some cases there may be results that are confusing or concerning to you. Please understand that not all results are received at the same time and often the doctors may need to interpret multiple results in order to provide you with the best plan of care or course of treatment. Therefore, we ask that you please give [place] 2 business days to thoroughly review all your results before contacting the office for clarification. Should we see a critical lab result, you will be contacted sooner.   If You Need Anything After Your Visit  If you have any questions or concerns for your doctor, please call our main line at [phone] If no one answers, please leave a voicemail as directed and we will return your call as soon as possible. Messages left after 4 pm will be answered the following business day.   You may also send [place] a message via MyChart. We typically respond to MyChart messages within 1-2 business days.  For prescription refills, please ask your pharmacy to contact our office. Our fax number is [phone].  If you have an urgent issue when the clinic is closed that cannot wait until the next business day, you can page your doctor at the number below.    Please note that while we do our best to be available for urgent issues outside of office hours, we are not available 24/7.   If you have an urgent issue and are unable to reach [place], you may choose to seek medical care at your doctor's office, retail clinic, urgent care center, or emergency room.  If you have a medical emergency, please immediately call 911 or go to the emergency department. In the event of inclement weather, please call our main line at [phone] for an update on the status of any delays or  closures.  Dermatology Medication Tips: Please keep the boxes that topical medications come in in order to help keep track of the instructions about where and how to use these. Pharmacies typically print the medication instructions only on the boxes and not directly on the medication tubes.   If your medication is too expensive, please contact our office at [phone] or send [place] a message through MyChart.   We are unable to tell what your co-pay for medications will be in advance as this is different depending on your insurance coverage. However, we may be able to find a substitute medication at lower cost or fill out paperwork to get insurance to cover a needed medication.   If a prior authorization is required to get your medication covered by your insurance company, please allow [place] 1-2 business days to complete this process.  Drug prices often vary depending on where the prescription is filled and some pharmacies may offer cheaper prices.  The website www.goodrx.com contains coupons for medications through different pharmacies. The prices here do not account for what the cost may be with help from insurance (it may be cheaper with your insurance), but the website can give you the price if you did not use any insurance.  - You can print the associated coupon and take it with your prescription to the pharmacy.  - You may also stop by our office during regular business hours and pick up a GoodRx coupon card.  - If  you need your prescription sent electronically to a different pharmacy, notify our office through [facility] or by phone at [phone]    Skin Education :   I counseled the patient regarding the following: Sun screen (SPF 30 or greater) should be applied during peak UV exposure (between 10am and 2pm) and reapplied after exercise or swimming.  The ABCDEs of melanoma were reviewed with the patient, and the importance of monthly self-examination of moles was emphasized.  Should any moles change in shape or color, or itch, bleed or burn, pt will contact our office for evaluation sooner then their interval appointment.  Plan: Sunscreen Recommendations I recommended a broad spectrum sunscreen with a SPF of 30 or higher. I explained that SPF 30 sunscreens block approximately 97 percent of the sun's harmful rays. Sunscreens should be applied at least 15 minutes prior to expected sun exposure and then every 2 hours after that as long as sun exposure continues. If swimming or exercising sunscreen should be reapplied every 45 minutes to an hour after getting wet or sweating. One ounce, or the equivalent of a shot glass full of sunscreen, is adequate to protect the skin not covered by a bathing suit. I also recommended a lip balm with a sunscreen as well. Sun protective clothing can be used in lieu of sunscreen but must be worn the entire time you are exposed to the sun's rays.                                                                Daily Skincare Regimen: Patient Handout  Morning Routine:  Cleanse: Start your day by washing your face with a gentle cleanser. Choose a product suitable for your skin type, such as CeraVe, Neutrogena, Cetaphil, or LaRoche-Posay. Gently massage the cleanser onto damp skin, then rinse thoroughly with lukewarm water and pat dry with a clean towel.  Moisturize: Finish your morning routine by applying a moisturizer to your face and neck. Opt for a moisturizer that has SPF included, is suitable for your skin type and provides hydration without clogging pores. Consider brands like CeraVe, Neutrogena, Cetaphil, or LaRoche-Posay for effective hydration and skin barrier support.  Evening Routine:  Cleanse: Before bed, cleanse your face again with a gentle cleanser to remove makeup, dirt, and impurities accumulated throughout the day. Use the same cleanser as in the morning and follow the same steps for cleansing.  Moisturize: After applying any  medications, moisturize your skin to seal in hydration and support skin barrier function. Choose a moisturizer suitable for nighttime use that helps replenish moisture overnight. Look for products from trusted brands like CeraVe, Neutrogena, Cetaphil, or LaRoche-Posay for optimal results.   Additional Tips:  Sun Protection: During the daytime, it is essential to apply a broad-spectrum sunscreen with an SPF of 30 or higher to protect your skin from harmful UV rays. Apply sunscreen as the last step of your morning skincare routine and reapply throughout the day as needed, especially if you will be spending time outdoors.  Hydration: Drink plenty of water throughout the day to keep your skin hydrated from within.  Healthy Lifestyle: Maintain a balanced diet, get regular exercise, manage stress, and prioritize adequate sleep to support overall skin health.   Following a consistent daily skincare  regimen can help promote healthy, radiant skin and minimize the risk of common skin issues. Be patient and consistent with your routine, and remember to listen to your skin's needs

## 2023-10-03 NOTE — Progress Notes (Cosign Needed)
 [facility]  [address] [place],  [place]  [address] [phone]  Clinic Day:  10/03/2023  Referring physician: Melonie Colonel, Irma M, *  HISTORY OF PRESENT ILLNESS:  The patient is a 48 y.o. female with stage IIIC (T4b N1a M0) melanoma of the right leg, status post a wide local excision and right inguinal sentinel lymph node biopsy in March 2024.  Due to her melanoma staging, she is receiving adjuvant pembrolizumab  immunotherapy.  She comes in today to be evaluated before heading into her 13th cycle of treatment.  The patient claims to have tolerated her 12th cycle of pembrolizumab  fairly well.  She reports worsening itchy rash, which initially improved with benadryl  and famotidine .  At her last visit, the concerning lesion adjacent to her surgical site in her right leg had enlarged.  I had her see Dr. Bert and punch biopsies were positive for metastatic melanoma. He ordered a restaging PET scan prior to resection, which was done last week.  We do not have the report yet. She has some excoriated lesions are not healing. She saw dermatology today and was prescribed and ointment for the open sores, she does not know the name of this. She states she had a viral infection a couple of weeks ago, which resolved on it's own.   PHYSICAL EXAM:  Blood pressure (!) 161/97, pulse 85, temperature 98.7 F (37.1 C), temperature source Oral, resp. rate 18, height 5' 5 (1.651 m), weight (!) 305 lb 1.6 oz (138.4 kg), SpO2 98%. Wt Readings from Last 3 Encounters:  10/03/23 (!) 305 lb 1.6 oz (138.4 kg)  09/01/23 (!) 313 lb 9.6 oz (142.2 kg)  08/08/23 (!) 314 lb 6.4 oz (142.6 kg)   Body mass index is 50.77 kg/m.  Performance status (ECOG): 1 - Symptomatic but completely ambulatory  Physical Exam Vitals and nursing note reviewed.  Constitutional:      General: She is not in acute distress.    Appearance: Normal appearance. She is obese. She is not ill-appearing.  HENT:      Head: Normocephalic and atraumatic.     Mouth/Throat:     Mouth: Mucous membranes are moist.     Pharynx: Oropharynx is clear. No oropharyngeal exudate or posterior oropharyngeal erythema.  Eyes:     General: No scleral icterus.    Extraocular Movements: Extraocular movements intact.     Conjunctiva/sclera: Conjunctivae normal.     Pupils: Pupils are equal, round, and reactive to light.  Cardiovascular:     Rate and Rhythm: Normal rate and regular rhythm.     Heart sounds: Normal heart sounds. No murmur heard.    No friction rub. No gallop.  Pulmonary:     Effort: Pulmonary effort is normal.     Breath sounds: Normal breath sounds. No wheezing, rhonchi or rales.  Abdominal:     General: Bowel sounds are normal. There is no distension.     Palpations: Abdomen is soft. There is no hepatomegaly, splenomegaly or mass.     Tenderness: There is no abdominal tenderness.  Musculoskeletal:        General: Normal range of motion.     Cervical back: Normal range of motion and neck supple. No tenderness.     Right lower leg: No edema.     Left lower leg: No edema.  Lymphadenopathy:     Cervical: No cervical adenopathy.     Upper Body:     Right upper body: No supraclavicular or axillary  adenopathy.     Left upper body: No supraclavicular or axillary adenopathy.     Lower Body: No right inguinal adenopathy. No left inguinal adenopathy.  Skin:    General: Skin is warm and dry.     Coloration: Skin is not jaundiced.     Findings: Lesion present.     Comments: Scabs over biopsied areas of the right lower extremity adjacent to her surgical scar, stable in size  There is no current skin rash, there are scattered excoriated lesions without infection   Neurological:     Mental Status: She is alert and oriented to person, place, and time.     Cranial Nerves: No cranial nerve deficit.  Psychiatric:        Mood and Affect: Mood normal.        Behavior: Behavior normal.        Thought Content:  Thought content normal.     LABS:      Latest Ref Rng & Units 10/03/2023   12:38 PM 09/01/2023    8:17 AM 08/02/2023   11:48 AM  CBC  WBC 4.0 - 10.5 K/uL 10.6  7.6  15.8   Hemoglobin 12.0 - 15.0 g/dL 87.4  87.4  87.0   Hematocrit 36.0 - 46.0 % 36.4  35.7  38.7   Platelets 150 - 400 K/uL 263  237  319       Latest Ref Rng & Units 10/03/2023   12:38 PM 09/01/2023    8:17 AM 08/02/2023   11:48 AM  CMP  Glucose 70 - 99 mg/dL 98  865  92   BUN 6 - 20 mg/dL 11  16  12    Creatinine 0.44 - 1.00 mg/dL 9.19  9.16  9.00   Sodium 135 - 145 mmol/L 141  139  140   Potassium 3.5 - 5.1 mmol/L 3.7  3.8  3.9   Chloride 98 - 111 mmol/L 105  103  103   CO2 22 - 32 mmol/L 25  25  27    Calcium 8.9 - 10.3 mg/dL 9.4  9.3  9.5   Total Protein 6.5 - 8.1 g/dL 6.4  6.2  6.4   Total Bilirubin 0.0 - 1.2 mg/dL 0.5  0.4  0.3   Alkaline Phos 38 - 126 U/L 115  94  99   AST 15 - 41 U/L 17  19  16    ALT 0 - 44 U/L 17  17  19      Lab Results  Component Value Date   LDH 123 12/12/2022       Component Value Date/Time   LDH 123 12/12/2022 1515    Review Flowsheet       Latest Ref Rng & Units 12/12/2022  Oncology Labs  LDH 98 - 192 U/L 123    STUDIES:  No results found.     ASSESSMENT & PLAN:   Assessment/Plan:  48 y.o. female with stage IIIC melanoma of the right, status post a wide local excision and sentinel lymph node biopsy in March 2024.  Prior to her 11th cycle of pembrolizumab , she had been on prednisone  30 mg daily.  She was weaned off the prednisone  rapidly last month so it will not offset the immune effects from her pembrolizumab  immunotherapy.  She received a 12th cycle on December 13. She missed her appointment last week.  Unfortunately, she had local recurrence in the right leg.  Additional excision was recommended pending PET scan. After discussion with Dr. Ezzard, he  recommends holding further treatment pending the PET results. Hopefully, we will be able to proceed with additional  excision.  He will plan to see the patient back in 6 weeks to decide on treatment going forward.  The patient understands all the plans discussed today and is in agreement with them.  She knows to contact our office if she develops concerns prior to her next appointment.     Lilianne Delair A Cara Aguino, PA-C   [facility] - A DE[facility] OF [place] [address] [place] [address] Dept: [phone] Dept Fax: [phone]

## 2023-10-04 ENCOUNTER — Other Ambulatory Visit: Payer: Self-pay

## 2023-10-04 LAB — SURGICAL PATHOLOGY

## 2023-10-05 ENCOUNTER — Encounter: Payer: Self-pay | Admitting: Oncology

## 2023-10-09 ENCOUNTER — Telehealth: Payer: Self-pay

## 2023-10-09 DIAGNOSIS — C4371 Malignant melanoma of right lower limb, including hip: Secondary | ICD-10-CM | POA: Diagnosis not present

## 2023-10-09 DIAGNOSIS — S91301A Unspecified open wound, right foot, initial encounter: Secondary | ICD-10-CM | POA: Diagnosis not present

## 2023-10-09 DIAGNOSIS — L509 Urticaria, unspecified: Secondary | ICD-10-CM | POA: Diagnosis not present

## 2023-10-09 DIAGNOSIS — Z6841 Body Mass Index (BMI) 40.0 and over, adult: Secondary | ICD-10-CM | POA: Diagnosis not present

## 2023-10-09 DIAGNOSIS — B370 Candidal stomatitis: Secondary | ICD-10-CM | POA: Diagnosis not present

## 2023-10-09 NOTE — Telephone Encounter (Signed)
Pt called to report that the open areas she has on her body and feet are itching. No drainage. Afebrile. She states these sores have been ongoing for sometime. Pt encouraged to call her PCP for appointment. Dr Melvyn Neth agreed with above. Pt has been taking Keytruda.  [name], has referred her to dermatology in the past for the sores.

## 2023-10-12 ENCOUNTER — Other Ambulatory Visit: Payer: Self-pay | Admitting: Oncology

## 2023-10-12 ENCOUNTER — Other Ambulatory Visit: Payer: Self-pay

## 2023-10-12 DIAGNOSIS — C4371 Malignant melanoma of right lower limb, including hip: Secondary | ICD-10-CM

## 2023-10-12 NOTE — Progress Notes (Signed)
This information was for discussion at Tumor board only. It is not a part of the official treatment plan.

## 2023-10-13 ENCOUNTER — Encounter: Payer: Self-pay | Admitting: Oncology

## 2023-10-16 NOTE — Progress Notes (Signed)
Irish Lack, MD  Claudean Kinds PROCEDURE / BIOPSY REVIEW Date: 10/16/23  Requested Biopsy site: Right cervical LN Reason for request: Hypermetabolic LN's. Hx of melanoma. Imaging review: Best seen on PET  Decision: Approved Imaging modality to perform: Ultrasound Schedule with: Moderate Sedation Schedule for: Any VIR  Additional comments: @Schedulers . Can give option of sedation or no sedation. May be little more difficult given small size of LN by PET.  Please contact me with questions, concerns, or if issue pertaining to this request arise.  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

## 2023-10-18 DIAGNOSIS — Z6841 Body Mass Index (BMI) 40.0 and over, adult: Secondary | ICD-10-CM | POA: Diagnosis not present

## 2023-10-18 DIAGNOSIS — L0103 Bullous impetigo: Secondary | ICD-10-CM | POA: Diagnosis not present

## 2023-10-25 DIAGNOSIS — G4733 Obstructive sleep apnea (adult) (pediatric): Secondary | ICD-10-CM | POA: Diagnosis not present

## 2023-10-30 ENCOUNTER — Telehealth: Payer: Self-pay | Admitting: Oncology

## 2023-10-30 ENCOUNTER — Other Ambulatory Visit: Payer: Self-pay | Admitting: Radiology

## 2023-10-30 DIAGNOSIS — Z6841 Body Mass Index (BMI) 40.0 and over, adult: Secondary | ICD-10-CM | POA: Diagnosis not present

## 2023-10-30 DIAGNOSIS — B370 Candidal stomatitis: Secondary | ICD-10-CM | POA: Diagnosis not present

## 2023-10-30 DIAGNOSIS — N898 Other specified noninflammatory disorders of vagina: Secondary | ICD-10-CM | POA: Diagnosis not present

## 2023-10-30 DIAGNOSIS — C4371 Malignant melanoma of right lower limb, including hip: Secondary | ICD-10-CM

## 2023-10-30 DIAGNOSIS — L0103 Bullous impetigo: Secondary | ICD-10-CM | POA: Diagnosis not present

## 2023-10-30 NOTE — Telephone Encounter (Signed)
 10/30/23 Per patient and Dorothyann Gather patient requesting Transfer of Care to [facility].Sent msg to [employer]. At [facility]

## 2023-11-01 ENCOUNTER — Other Ambulatory Visit: Payer: Self-pay

## 2023-11-01 ENCOUNTER — Ambulatory Visit (HOSPITAL_COMMUNITY)
Admission: RE | Admit: 2023-11-01 | Discharge: 2023-11-01 | Disposition: A | Discharge status: Home / Self Care | Payer: 59 | Source: Ambulatory Visit | Attending: Oncology | Admitting: Oncology

## 2023-11-01 ENCOUNTER — Encounter (HOSPITAL_COMMUNITY): Payer: Self-pay

## 2023-11-01 ENCOUNTER — Ambulatory Visit (HOSPITAL_COMMUNITY)
Admission: RE | Admit: 2023-11-01 | Discharge: 2023-11-01 | Disposition: A | Discharge status: Home / Self Care | Payer: 59 | Source: Ambulatory Visit | Attending: Family Medicine

## 2023-11-01 ENCOUNTER — Encounter: Payer: Self-pay | Admitting: *Deleted

## 2023-11-01 DIAGNOSIS — C4371 Malignant melanoma of right lower limb, including hip: Secondary | ICD-10-CM

## 2023-11-01 DIAGNOSIS — I10 Essential (primary) hypertension: Secondary | ICD-10-CM | POA: Diagnosis not present

## 2023-11-01 DIAGNOSIS — Z85820 Personal history of malignant melanoma of skin: Secondary | ICD-10-CM | POA: Diagnosis not present

## 2023-11-01 DIAGNOSIS — R918 Other nonspecific abnormal finding of lung field: Secondary | ICD-10-CM | POA: Diagnosis not present

## 2023-11-01 DIAGNOSIS — G4730 Sleep apnea, unspecified: Secondary | ICD-10-CM | POA: Insufficient documentation

## 2023-11-01 DIAGNOSIS — Z7985 Long-term (current) use of injectable non-insulin antidiabetic drugs: Secondary | ICD-10-CM | POA: Insufficient documentation

## 2023-11-01 DIAGNOSIS — R234 Changes in skin texture: Secondary | ICD-10-CM | POA: Insufficient documentation

## 2023-11-01 DIAGNOSIS — F32A Depression, unspecified: Secondary | ICD-10-CM | POA: Insufficient documentation

## 2023-11-01 DIAGNOSIS — R590 Localized enlarged lymph nodes: Secondary | ICD-10-CM | POA: Insufficient documentation

## 2023-11-01 DIAGNOSIS — Z5986 Financial insecurity: Secondary | ICD-10-CM | POA: Insufficient documentation

## 2023-11-01 DIAGNOSIS — R599 Enlarged lymph nodes, unspecified: Secondary | ICD-10-CM | POA: Diagnosis not present

## 2023-11-01 DIAGNOSIS — F17290 Nicotine dependence, other tobacco product, uncomplicated: Secondary | ICD-10-CM | POA: Diagnosis not present

## 2023-11-01 DIAGNOSIS — E119 Type 2 diabetes mellitus without complications: Secondary | ICD-10-CM | POA: Insufficient documentation

## 2023-11-01 LAB — CBC WITH DIFFERENTIAL/PLATELET
Abs Immature Granulocytes: 0.04 10*3/uL (ref 0.00–0.07)
Basophils Absolute: 0.1 10*3/uL (ref 0.0–0.1)
Basophils Relative: 1 %
Eosinophils Absolute: 0.2 10*3/uL (ref 0.0–0.5)
Eosinophils Relative: 2 %
HCT: 37.1 % (ref 36.0–46.0)
Hemoglobin: 12 g/dL (ref 12.0–15.0)
Immature Granulocytes: 0 %
Lymphocytes Relative: 22 %
Lymphs Abs: 2 10*3/uL (ref 0.7–4.0)
MCH: 28.4 pg (ref 26.0–34.0)
MCHC: 32.3 g/dL (ref 30.0–36.0)
MCV: 87.9 fL (ref 80.0–100.0)
Monocytes Absolute: 0.8 10*3/uL (ref 0.1–1.0)
Monocytes Relative: 9 %
Neutro Abs: 6 10*3/uL (ref 1.7–7.7)
Neutrophils Relative %: 66 %
Platelets: 274 10*3/uL (ref 150–400)
RBC: 4.22 MIL/uL (ref 3.87–5.11)
RDW: 13.3 % (ref 11.5–15.5)
WBC: 9.1 10*3/uL (ref 4.0–10.5)
nRBC: 0 % (ref 0.0–0.2)

## 2023-11-01 LAB — GLUCOSE, CAPILLARY: Glucose-Capillary: 104 mg/dL — ABNORMAL HIGH (ref 70–99)

## 2023-11-01 LAB — PROTIME-INR
INR: 1 (ref 0.8–1.2)
Prothrombin Time: 13.6 s (ref 11.4–15.2)

## 2023-11-01 MED ORDER — FENTANYL CITRATE (PF) 100 MCG/2ML IJ SOLN
INTRAMUSCULAR | Status: AC
  Filled 2023-11-01: qty 2

## 2023-11-01 MED ORDER — MIDAZOLAM HCL 2 MG/2ML IJ SOLN
INTRAMUSCULAR | Status: AC | PRN
  Administered 2023-11-01 (×2): 1 mg via INTRAVENOUS

## 2023-11-01 MED ORDER — FENTANYL CITRATE (PF) 100 MCG/2ML IJ SOLN
INTRAMUSCULAR | Status: AC | PRN
  Administered 2023-11-01 (×2): 50 ug via INTRAVENOUS

## 2023-11-01 MED ORDER — MIDAZOLAM HCL 2 MG/2ML IJ SOLN
INTRAMUSCULAR | Status: AC
  Filled 2023-11-01: qty 2

## 2023-11-01 MED ORDER — SODIUM CHLORIDE 0.9 % IV SOLN: INTRAVENOUS | Status: DC

## 2023-11-01 MED ORDER — LIDOCAINE HCL 1 % IJ SOLN
INTRAMUSCULAR | Status: AC
  Filled 2023-11-01: qty 20

## 2023-11-01 NOTE — Procedures (Signed)
Interventional Radiology Procedure Note  Procedure: US guided biopsy of right cervical lymph node.  Hx melanoma.  Complications: None EBL: None  Recommendations: - Bedrest 1 hours.   - Routine wound care - Follow up pathology - Advance diet   Signed,  Gilmer Mor, DO

## 2023-11-01 NOTE — Progress Notes (Signed)
Received message that patient would like to transfer her care to Dr Myna Hidalgo. She had scans recently that showed possible progression, and she is in IR today getting a biopsy.   Reached out to [employer] to introduce myself as the office RN Navigator and explain our new patient process. Reviewed the reason for their referral and scheduled their new patient appointment along with labs. Provided address and directions to the office including call back phone number. Reviewed with patient any concerns they may have or any possible barriers to attending their appointment.   Informed patient about my role as a navigator and that I will meet with them prior to their New Patient appointment and more fully discuss what services I can provide. At this time patient has no further questions or needs.    Oncology Nurse Navigator Documentation     11/01/2023    2:30 PM  Oncology Nurse Navigator Flowsheets  Navigator Follow Up Date: 11/03/2023  Navigator Follow Up Reason: Pathology  Navigator Location [facility]  Navigator Encounter Type Introductory Phone Call  Patient Visit Type MedOnc  Treatment Phase Active Tx  Barriers/Navigation Needs Coordination of Care;Education  Education Other  Interventions Coordination of Care;Education  Acuity Level 2-Minimal Needs (1-2 Barriers Identified)  Coordination of Care Appts  Education Method Verbal  Time Spent with Patient 30

## 2023-11-01 NOTE — Discharge Instructions (Signed)
Please call Interventional Radiology clinic [phone] with any questions or concerns.  You may remove your dressing and shower tomorrow.  Needle Biopsy, Care After This sheet gives you information about how to care for yourself after your procedure. Your health care provider may also give you more specific instructions. If you have problems or questions, contact your health care provider. What can I expect after the procedure? After the procedure, it is common to have  Soreness Bruising Mild pain at the insertion site.  This should go away in a few days.  Follow these instructions at home: Needle insertion site care Wash your hands with soap and water before you change your bandage (dressing). If you cannot use soap and water, use hand sanitizer. Follow instructions from your health care provider about how to take care of your insertion site. This includes: When and how to change your dressing. When to remove your dressing. Check your insertion site every day for signs of infection. Check for: Redness, swelling, or pain. Fluid or blood. Pus or a bad smell. Warmth/Heat around your insertion site   General instructions Return to your normal activities as told by your health care provider. Ask your health care provider what activities are safe for you. Do not take baths, swim, or use a hot tub until your health care provider approves. Ask your health care provider if you may take showers. You may only be allowed to take sponge baths. Take over-the-counter and prescription medicines only as told by your health care provider. Keep all follow-up visits as told by your health care provider. This is important.  Contact a health care provider if: You have a fever. You have redness, swelling, or pain at the insertion site that lasts longer than a few days. You have fluid, blood, or pus coming from your insertion site. Your insertion site feels warm to the touch. Get help right away  if: You have severe bleeding from the insertion site. Summary After the procedure, it is common to have soreness, bruising, or mild pain at the insertion site. This should go away in a few days. Check your insertion site every day for signs of infection, such as redness, swelling, or pain. Get help right away if you have severe bleeding from your insertion site.  This information is not intended to replace advice given to you by your health care provider. Make sure you discuss any questions you have with your healthcare provider. Document Revised: 03/05/2020 Document Reviewed: 03/05/2020 Elsevier Patient Education  2022 Elsevier Inc.  Moderate Conscious Sedation-Care After  This sheet gives you information about how to care for yourself after your procedure. Your health care provider may also give you more specific instructions. If you have problems or questions, contact your health care provider.  After the procedure, it is common to have: Sleepiness for several hours. Impaired judgment for several hours. Difficulty with balance. Vomiting if you eat too soon.  Follow these instructions at home:  Rest. Do not participate in activities where you could fall or become injured. Do not drive or use machinery. Do not drink alcohol. Do not take sleeping pills or medicines that cause drowsiness. Do not make important decisions or sign legal documents. Do not take care of children on your own.  Eating and drinking Follow the diet recommended by your health care provider. Drink enough fluid to keep your urine pale yellow. If you vomit: Drink water, juice, or soup when you can drink without vomiting. Make sure you have  little or no nausea before eating solid foods.  General instructions Take over-the-counter and prescription medicines only as told by your health care provider. Have a responsible adult stay with you for the time you are told. It is important to have someone help care for  you until you are awake and alert. Do not smoke. Keep all follow-up visits as told by your health care provider. This is important.  Contact a health care provider if: You are still sleepy or having trouble with balance after 24 hours. You feel light-headed. You keep feeling nauseous or you keep vomiting. You develop a rash. You have a fever. You have redness or swelling around the IV site.  Get help right away if: You have trouble breathing. You have new-onset confusion at home.  This information is not intended to replace advice given to you by your health care provider. Make sure you discuss any questions you have with your healthcare provider.

## 2023-11-01 NOTE — H&P (Addendum)
Chief Complaint: Melanoma, cervical adenopathy; referred for US guided lymph node biopsy  Referring Provider(s): Lewis,D  Supervising Physician: Gilmer Mor  Patient Status: [facility] - Out-pt  History of Present Illness: Diana Odom is a 48 y.o. female with PMH sig for depression, DM, HTN, sleep apnea and  stage IIIC (T4b N1a M0) melanoma, status post a wide local excision and right inguinal sentinel lymph node biopsy in March 2024. She is on immunotherapy/Keytruda. Recent PET scan on 09/26/23 revealed:   1. Interval development of bilateral pulmonary nodules with indistinct margins and low level FDG uptake. Differential considerations include metastatic disease versus inflammatory/infectious nodules. 2. Interval development of increased FDG uptake associated with bilateral cervical lymph nodes. The most intense uptake localizes to the right level 2 lymph node which has an SUV max of 6.67. 3. Mild diffuse increased radiotracer uptake throughout the bone marrow is nonspecific, but likely treatment related. 4. Focal area of skin thickening and mild subcutaneous soft tissue stranding overlying the anterolateral aspect of the right lower leg with SUV max of 2.71. This most likely reflects post procedure changes associated with the primary melanoma site. 5. Several small superficial foci of increased uptake localizing to the skin including: Within the left calf, and medial aspect of the right hindfoot. These findings are nonspecific and correlation with physical exam findings advised     She is scheduled today for image guided biopsy of a right cervical lymph node for further evaluation. She is known to IR team from port a cath injection on 08/04/23 followed by port revision on 08/08/23.   Patient is Full Code  Past Medical History:  Diagnosis Date   Cancer (HCC)    Depression    Diabetes mellitus without complication (HCC)    Hypertension    Melanoma (HCC)    Sleep  apnea     Past Surgical History:  Procedure Laterality Date   CHOLECYSTECTOMY     IR CV LINE INJECTION  08/04/2023   IR IMAGING GUIDED PORT INSERTION  08/08/2023   IR REMOVAL TUN ACCESS W/ PORT W/O FL MOD SED  08/08/2023   MELANOMA EXCISION WITH SENTINEL LYMPH NODE BIOPSY      Allergies: Patient has no known allergies.  Medications: Prior to Admission medications   Medication Sig Start Date End Date Taking? Authorizing Provider  ARIPiprazole (ABILIFY) 2 MG tablet Take 2 mg by mouth daily. 08/03/23  Yes [provider]  atorvastatin (LIPITOR) 10 MG tablet Take 10 mg by mouth at bedtime. 05/03/23  Yes [provider]  BD PEN NEEDLE MICRO U/F 32G X 6 MM MISC SMARTSIG:1 pen needle SUB-Q Every Other Day 07/28/23  Yes [provider]  Continuous Glucose Sensor (DEXCOM G7 SENSOR) MISC 1 (ONE) EACH EVERY 10 DAYS 08/02/23  Yes [provider]  FLUoxetine (PROZAC) 40 MG capsule Take 40 mg by mouth daily. 05/25/23  Yes [provider]  hydrochlorothiazide (HYDRODIURIL) 25 MG tablet Take by mouth.   Yes [provider]  hydrOXYzine (ATARAX) 25 MG tablet Take 25 mg by mouth 2 (two) times daily. 01/03/23  Yes [provider]  lisinopril (ZESTRIL) 20 MG tablet Take 1 tablet by mouth daily.   Yes [provider]  metFORMIN (GLUCOPHAGE) 1000 MG tablet Take 1 tablet (1,000 mg total) by mouth 2 (two) times daily with a meal. 10/10/21  Yes Mancel Bale, MD  metoprolol succinate (TOPROL-XL) 25 MG 24 hr tablet Take by mouth.   Yes [provider]  montelukast (  SINGULAIR) 10 MG tablet Take 10 mg by mouth daily.   Yes [provider]  mupirocin cream (BACTROBAN) 2 % Apply 1 Application topically 2 (two) times daily.   Yes [provider]  NOVOLOG FLEXPEN 100 UNIT/ML FlexPen Inject into the skin as directed. Sliding scale 03/16/23  Yes [provider]  ondansetron (ZOFRAN) 8 MG tablet Take 1 tablet (8 mg  total) by mouth every 8 (eight) hours as needed for nausea or vomiting. 12/28/22  Yes Ribakove, Cindie Crumbly, MD  potassium chloride (MICRO-K) 10 MEQ CR capsule Take 10 mEq by mouth 2 (two) times daily. 01/03/23  Yes [provider]  predniSONE (DELTASONE) 10 MG tablet Take 10 mg by mouth daily with breakfast. 6 tablets  Taking d/t reaction to [place]   Yes [provider]  TRESIBA FLEXTOUCH 100 UNIT/ML FlexTouch Pen Inject 10 Units into the skin at bedtime. 03/06/23  Yes [provider]  albuterol (VENTOLIN HFA) 108 (90 Base) MCG/ACT inhaler Inhale 2 puffs into the lungs every 6 (six) hours as needed for wheezing or shortness of breath. 02/08/23   Cobb, Ruby Cola, NP  clobetasol (TEMOVATE) 0.05 % external solution Apply 1 Application topically 2 (two) times daily. Apply to scalp 2 x per day as needed 10/03/23   Gwenith Daily, MD  FIASP FLEXTOUCH 100 UNIT/ML FlexTouch Pen  08/26/23   [provider]  lidocaine-prilocaine (EMLA) cream Apply to affected area once 03/29/23   Ribakove, Cindie Crumbly, MD  OZEMPIC, 2 MG/DOSE, 8 MG/3ML SOPN Inject 2 mg into the skin once a week.    [provider]  prochlorperazine (COMPAZINE) 10 MG tablet Take 1 tablet (10 mg total) by mouth every 6 (six) hours as needed for nausea or vomiting. 12/28/22   [name], MD  varenicline (CHANTIX) 1 MG tablet Take 1 mg by mouth 2 (two) times daily. Patient not taking: Reported on 09/01/2023 03/07/23   [provider]     History reviewed. No pertinent family history.  Social History   Socioeconomic History   Marital status: Divorced    Spouse name: Not on file   Number of children: 2   Years of education: 12   Highest education level: 12th grade  Occupational History    Comment: Print production planner  Tobacco Use   Smoking status: Former    Current packs/day: 0.00    Average packs/day: 0.5 packs/day for 30.0 years (15.0 ttl pk-yrs)    Types: Cigarettes    Start date:  11/22/1992    Quit date: 11/23/2022    Years since quitting: 0.9   Smokeless tobacco: Not on file  Vaping Use   Vaping status: Some Days   Substances: CBD   Devices: CBD vape occasionally  Substance and Sexual Activity   Alcohol use: Never   Drug use: No    Types: Marijuana    Comment: former marijuana smoker   Sexual activity: Not Currently  Other Topics Concern   Not on file  Social History Narrative   Not on file   Social Drivers of Health   Financial Resource Strain: Medium Risk (12/28/2022)   Overall Financial Resource Strain (CARDIA)    Difficulty of Paying Living Expenses: Somewhat hard  Food Insecurity: Low Risk  (03/06/2023)   Received from [facility]   Hunger Vital Sign    Worried About Running Out of Food in the Last Year: Never true    Ran Out of Food in the Last Year: Never true  Transportation Needs: Not on file (03/06/2023)  Physical Activity: Inactive (12/28/2022)   Exercise Vital Sign    Days of Exercise per Week: 0 days    Minutes of Exercise per Session: 0 min  Stress: Stress Concern Present (12/28/2022)   [employer] of Occupational Health - Occupational Stress Questionnaire    Feeling of Stress : Rather much  Social Connections: Moderately Isolated (12/28/2022)   Social Connection and Isolation Panel [NHANES]    Frequency of Communication with Friends and Family: More than three times a week    Frequency of Social Gatherings with Friends and Family: More than three times a week    Attends Religious Services: 1 to 4 times per year    Active Member of [employer] or Organizations: No    Attends Engineer, structural: Never    Marital Status: Divorced       Review of Systems: denies fever,HA,CP,dyspnea, abd/back pain,N/V or bleeding; does have occ cough  Vital Signs: Pulse 72   Temp 98.7 F (37.1 C) (Oral)   Resp 16   SpO2 99%   Advance Care Plan: no documents on file  Physical Exam; awake/alert; chest- CTA bilat; rt chest wall port  present; heart- RRR; abd-soft,obese, +BS,NT; ext with FROM; scatt skin blister craters noted (? Secondary to Keytruda); rt lower leg wound from melanoma excision  Imaging: No results found.  Labs:  CBC: Recent Labs    08/02/23 1148 09/01/23 0817 10/03/23 1238 11/01/23 1130  WBC 15.8* 7.6 10.6* 9.1  HGB 12.9 12.5 12.5 12.0  HCT 38.7 35.7* 36.4 37.1  PLT 319 237 263 274    COAGS: Recent Labs    11/01/23 1130  INR 1.0    BMP: Recent Labs    07/10/23 1532 08/02/23 1148 09/01/23 0817 10/03/23 1238  NA 134* 140 139 141  K 4.1 3.9 3.8 3.7  CL 98 103 103 105  CO2 26 27 25 25   GLUCOSE 138* 92 134* 98  BUN 18 12 16 11   CALCIUM 9.4 9.5 9.3 9.4  CREATININE 0.88 0.99 0.83 0.80  GFRNONAA >60 >60 >60 >60    LIVER FUNCTION TESTS: Recent Labs    07/10/23 1532 08/02/23 1148 09/01/23 0817 10/03/23 1238  BILITOT 0.3 0.3 0.4 0.5  AST 16 16 19 17   ALT 20 19 17 17   ALKPHOS 99 99 94 115  PROT 7.3 6.4* 6.2* 6.4*  ALBUMIN 3.8 3.9 3.6 3.8    TUMOR MARKERS: No results for input(s): "AFPTM", "CEA", "CA199", "CHROMGRNA" in the last 8760 hours.  Assessment and Plan: 48 y.o. female with PMH sig for depression, DM, HTN, sleep apnea and  stage IIIC (T4b N1a M0) melanoma, status post a wide local excision and right inguinal sentinel lymph node biopsy in March 2024. She has received immunotherapy. Recent PET scan on 09/26/23 revealed:   1. Interval development of bilateral pulmonary nodules with indistinct margins and low level FDG uptake. Differential considerations include metastatic disease versus inflammatory/infectious nodules. 2. Interval development of increased FDG uptake associated with bilateral cervical lymph nodes. The most intense uptake localizes to the right level 2 lymph node which has an SUV max of 6.67. 3. Mild diffuse increased radiotracer uptake throughout the bone marrow is nonspecific, but likely treatment related. 4. Focal area of skin thickening and mild  subcutaneous soft tissue stranding overlying the anterolateral aspect of the right lower leg with SUV max of 2.71. This most likely reflects post procedure changes associated with the primary melanoma site. 5. Several  small superficial foci of increased uptake localizing to the skin including: Within the left calf, and medial aspect of the right hindfoot. These findings are nonspecific and correlation with physical exam findings advised     She is known to IR team from port a cath injection on 08/04/23 followed by port revision on 08/08/23. She is scheduled today for image guided biopsy of a right cervical lymph node for further evaluation. Risks and benefits of procedure was discussed with the patient  including, but not limited to bleeding, infection, damage to adjacent structures or low yield requiring additional tests.  All of the questions were answered and there is agreement to proceed.  Consent signed and in chart.    Thank you for allowing our service to participate in Diana Odom 's care.  Electronically Signed: D. Jeananne Rama, PA-C   11/01/2023, 12:25 PM      I spent a total of    15 Minutes in face to face in clinical consultation, greater than 50% of which was counseling/coordinating care for image guided right cervical lymph node biopsy

## 2023-11-03 LAB — SURGICAL PATHOLOGY

## 2023-11-07 ENCOUNTER — Encounter: Payer: Self-pay | Admitting: *Deleted

## 2023-11-07 NOTE — Progress Notes (Signed)
LN core biopsy negative for malignancy  Oncology Nurse Navigator Documentation     11/07/2023    8:00 AM  Oncology Nurse Navigator Flowsheets  Navigator Follow Up Date: 11/10/2023  Navigator Follow Up Reason: New Patient Appointment  Navigator Location [facility]  Navigator Encounter Type Pathology Review  Patient Visit Type MedOnc  Treatment Phase Active Tx  Barriers/Navigation Needs Coordination of Care;Education  Interventions None Required  Acuity Level 2-Minimal Needs (1-2 Barriers Identified)  Time Spent with Patient 15

## 2023-11-10 ENCOUNTER — Inpatient Hospital Stay: Payer: 59 | Attending: Oncology

## 2023-11-10 ENCOUNTER — Other Ambulatory Visit: Payer: Self-pay

## 2023-11-10 ENCOUNTER — Encounter: Payer: Self-pay | Admitting: Hematology & Oncology

## 2023-11-10 ENCOUNTER — Inpatient Hospital Stay: Payer: 59 | Admitting: Hematology & Oncology

## 2023-11-10 ENCOUNTER — Inpatient Hospital Stay: Payer: 59

## 2023-11-10 ENCOUNTER — Encounter: Payer: Self-pay | Admitting: *Deleted

## 2023-11-10 VITALS — BP 168/92 | HR 60 | Temp 98.6°F | Resp 18 | Ht 65.0 in | Wt 310.0 lb

## 2023-11-10 DIAGNOSIS — C4371 Malignant melanoma of right lower limb, including hip: Secondary | ICD-10-CM | POA: Insufficient documentation

## 2023-11-10 DIAGNOSIS — K1370 Unspecified lesions of oral mucosa: Secondary | ICD-10-CM

## 2023-11-10 DIAGNOSIS — Z87891 Personal history of nicotine dependence: Secondary | ICD-10-CM

## 2023-11-10 LAB — CMP (CANCER CENTER ONLY)
ALT: 11 U/L (ref 0–44)
AST: 9 U/L — ABNORMAL LOW (ref 15–41)
Albumin: 3.7 g/dL (ref 3.5–5.0)
Alkaline Phosphatase: 96 U/L (ref 38–126)
Anion gap: 8 (ref 5–15)
BUN: 21 mg/dL — ABNORMAL HIGH (ref 6–20)
CO2: 31 mmol/L (ref 22–32)
Calcium: 9.4 mg/dL (ref 8.9–10.3)
Chloride: 103 mmol/L (ref 98–111)
Creatinine: 0.91 mg/dL (ref 0.44–1.00)
GFR, Estimated: >60 mL/min (ref >60)
Glucose, Bld: 164 mg/dL — ABNORMAL HIGH (ref 70–99)
Potassium: 4.1 mmol/L (ref 3.5–5.1)
Sodium: 142 mmol/L (ref 135–145)
Total Bilirubin: 0.4 mg/dL (ref 0.0–1.2)
Total Protein: 6.2 g/dL — ABNORMAL LOW (ref 6.5–8.1)

## 2023-11-10 LAB — CBC WITH DIFFERENTIAL (CANCER CENTER ONLY)
Abs Immature Granulocytes: 0.15 10*3/uL — ABNORMAL HIGH (ref 0.00–0.07)
Basophils Absolute: 0.1 10*3/uL (ref 0.0–0.1)
Basophils Relative: 1 %
Eosinophils Absolute: 0.3 10*3/uL (ref 0.0–0.5)
Eosinophils Relative: 2 %
HCT: 36.4 % (ref 36.0–46.0)
Hemoglobin: 11.8 g/dL — ABNORMAL LOW (ref 12.0–15.0)
Immature Granulocytes: 1 %
Lymphocytes Relative: 15 %
Lymphs Abs: 1.9 10*3/uL (ref 0.7–4.0)
MCH: 28.2 pg (ref 26.0–34.0)
MCHC: 32.4 g/dL (ref 30.0–36.0)
MCV: 86.9 fL (ref 80.0–100.0)
Monocytes Absolute: 0.8 10*3/uL (ref 0.1–1.0)
Monocytes Relative: 6 %
Neutro Abs: 9.7 10*3/uL — ABNORMAL HIGH (ref 1.7–7.7)
Neutrophils Relative %: 75 %
Platelet Count: 330 10*3/uL (ref 150–400)
RBC: 4.19 MIL/uL (ref 3.87–5.11)
RDW: 13.2 % (ref 11.5–15.5)
WBC Count: 12.9 10*3/uL — ABNORMAL HIGH (ref 4.0–10.5)
nRBC: 0 % (ref 0.0–0.2)

## 2023-11-10 LAB — TSH: TSH: 2.367 u[IU]/mL (ref 0.350–4.500)

## 2023-11-10 LAB — LACTATE DEHYDROGENASE: LDH: 164 U/L (ref 98–192)

## 2023-11-10 MED ORDER — TRAMADOL HCL 50 MG PO TABS: 100.0000 mg | ORAL_TABLET | Freq: Four times a day (QID) | ORAL | 0 refills | Status: DC | PRN

## 2023-11-10 MED ORDER — CHLORHEXIDINE GLUCONATE 0.12 % MT SOLN: 15.0000 mL | Freq: Four times a day (QID) | OROMUCOSAL | 0 refills | Status: DC

## 2023-11-10 MED ORDER — HYDROXYZINE HCL 25 MG PO TABS: 25.0000 mg | ORAL_TABLET | ORAL | 5 refills | Status: AC | PRN

## 2023-11-10 NOTE — Progress Notes (Signed)
Initial RN Navigator Patient Visit  Name: Diana Odom Diagnosis: Met Melanoma   Met with patient prior to their visit with MD. Jovita Gamma patient "Your Patient Navigator" handout which explains my role, areas in which I am able to help, and all the contact information for myself and the office. Also gave patient MD and Navigator business card.   Patient is transferring care from the [facility] location to this location. She has been under active treatment since her diagnosis in March 2024. She is already working with nutrition, but she would appreciate a referral to social work.  Patient has a port, however she is currently having multiple skin issues including wounds and sloughing of skin preventing access. Dr Myna Hidalgo will call a dermatologist in the [facility] to have patient seen ASAP.   She lives with her sister and Diana Odom daughter. She works full time in a Nurse, learning disability. She has a supportive home environment and thus far her employer has been flexible with appointments.   Per Dr Myna Hidalgo, request for BRAF testing sent on specimen [id number] DOS 09/04/2023 in the [facility]. Called and spoke to their tech [phone]) and she gave me instructions to fax [phone]). Request faxed.   Will follow up for referral next week.  Patient understands all follow up procedures and expectations. They have my number to reach out for any further clarification or additional needs.    Oncology Nurse Navigator Documentation     11/10/2023   11:00 AM  Oncology Nurse Navigator Flowsheets  Navigator Follow Up Date: 11/13/2023  Navigator Follow Up Reason: Appointment Review  Navigator Location [facility]  Navigator Encounter Type Initial MedOnc;Molecular Studies  Patient Visit Type MedOnc  Treatment Phase Active Tx  Barriers/Navigation Needs Coordination of Care;Education  Education Other  Interventions Coordination of Care;Education;Psycho-Social Support  Acuity Level  2-Minimal Needs (1-2 Barriers Identified)  Coordination of Care Pathology  Education Method Verbal;Written  Support Groups/Services Friends and Family  Time Spent with Patient 45

## 2023-11-10 NOTE — Progress Notes (Signed)
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  HPI: Diana Odom is a very nice 48 year old white female.  She lives down in [place].  Her history is incredibly interesting.  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  She underwent a wide local excision and sentinel node biopsy.  She subsequently has been receiving adjuvant chemotherapy with pembrolizumab.  I have no clue what the BRAF status is of the tumor.  She unfortunately seems to have developed a significant dermatologic problem.  She has had these papular lesions all over her body.  They are mostly on her legs and arms.  She has some down in her genital area.  However, she also seems to have had a recurrence in the right leg.  She had a biopsy that was done in December which showed melanoma.  She had a PET scan that was done in January.  Surprising, the PET scan seem to show that she had adenopathy in the neck.  She had multiple pulmonary nodules that were very small and barely hypermetabolic.  She had a lymph node biopsy that was done.  However, this was negative for any melanoma.  In the meantime, her skin has just gotten worse.  She has skin that is sloughing off her feet.  She has lost her toenails.  Have to believe this is all from immunotherapy.  She currently is on 40 of prednisone.  She is diabetic.  She really is miserable.  She has mouth sores.  She has sores down on the gynecologic area.  Clearly, this appears to be some type of autoimmune disease with respect to her immunotherapy.  It almost  looks like some pemphigus, particularly on her feet.  She has had no fever.  She was a smoker.  She thing has any alcohol use.  There is been no problems with  COVID.  Currently, I would say that her performance status is probably ECOG 2.    Past Medical History:  Diagnosis Date   Cancer (HCC)    Depression    Diabetes mellitus without complication (HCC)    Hypertension    Melanoma (HCC)    Sleep apnea   :   Past Surgical History:  Procedure Laterality Date   CHOLECYSTECTOMY     IR CV LINE INJECTION  08/04/2023   IR IMAGING GUIDED PORT INSERTION  08/08/2023   IR REMOVAL TUN ACCESS W/ PORT W/O FL MOD SED  08/08/2023   MELANOMA EXCISION WITH SENTINEL LYMPH NODE BIOPSY    :   Current Outpatient Medications:    fluconazole (DIFLUCAN) 150 MG tablet, Take 150 mg by mouth., Disp: , Rfl:    nystatin cream (MYCOSTATIN), Apply 1 Application topically 2 (two) times daily., Disp: , Rfl:    traMADol (ULTRAM) 50 MG tablet, Take 50 mg by mouth every 8 (eight) hours as needed., Disp: , Rfl:    albuterol (VENTOLIN HFA) 108 (90 Base) MCG/ACT inhaler, Inhale 2 puffs into the lungs every 6 (six) hours as needed for wheezing or shortness of breath., Disp: 8 g, Rfl: 2   ARIPiprazole (ABILIFY) 2 MG tablet, Take 2 mg by mouth daily., Disp: , Rfl:    atorvastatin (LIPITOR) 10 MG tablet, Take 10 mg by mouth at bedtime.,  Disp: , Rfl:    BD PEN NEEDLE MICRO U/F 32G X 6 MM MISC, SMARTSIG:1 pen needle SUB-Q Every Other Day, Disp: , Rfl:    clobetasol (TEMOVATE) 0.05 % external solution, Apply 1 Application topically 2 (two) times daily. Apply to scalp 2 x per day as needed, Disp: 50 mL, Rfl: 3   Continuous Glucose Sensor (DEXCOM G7 SENSOR) MISC, 1 (ONE) EACH EVERY 10 DAYS, Disp: , Rfl:    FIASP FLEXTOUCH 100 UNIT/ML FlexTouch Pen, , Disp: , Rfl:    FLUoxetine (PROZAC) 40 MG capsule, Take 40 mg by mouth daily., Disp: , Rfl:    hydrochlorothiazide (HYDRODIURIL) 25 MG tablet, Take by mouth., Disp: , Rfl:    hydrOXYzine (ATARAX) 25 MG tablet, Take 25 mg by mouth 2 (two) times daily., Disp: , Rfl:    lidocaine-prilocaine (EMLA) cream, Apply to affected area once,  Disp: 30 g, Rfl: 3   lisinopril (ZESTRIL) 20 MG tablet, Take 1 tablet by mouth daily., Disp: , Rfl:    metFORMIN (GLUCOPHAGE) 1000 MG tablet, Take 1 tablet (1,000 mg total) by mouth 2 (two) times daily with a meal., Disp: 60 tablet, Rfl: 1   metoprolol succinate (TOPROL-XL) 25 MG 24 hr tablet, Take by mouth., Disp: , Rfl:    montelukast (SINGULAIR) 10 MG tablet, Take 10 mg by mouth daily., Disp: , Rfl:    mupirocin cream (BACTROBAN) 2 %, Apply 1 Application topically 2 (two) times daily., Disp: , Rfl:    NOVOLOG FLEXPEN 100 UNIT/ML FlexPen, Inject into the skin as directed. Sliding scale, Disp: , Rfl:    ondansetron (ZOFRAN) 8 MG tablet, Take 1 tablet (8 mg total) by mouth every 8 (eight) hours as needed for nausea or vomiting., Disp: 30 tablet, Rfl: 1   OZEMPIC, 2 MG/DOSE, 8 MG/3ML SOPN, Inject 2 mg into the skin once a week., Disp: , Rfl:    potassium chloride (MICRO-K) 10 MEQ CR capsule, Take 10 mEq by mouth 2 (two) times daily., Disp: , Rfl:    predniSONE (DELTASONE) 10 MG tablet, Take 10 mg by mouth daily with breakfast. 6 tablets  Taking d/t reaction to [place], Disp: , Rfl:    prochlorperazine (COMPAZINE) 10 MG tablet, Take 1 tablet (10 mg total) by mouth every 6 (six) hours as needed for nausea or vomiting., Disp: 30 tablet, Rfl: 1   TRESIBA FLEXTOUCH 100 UNIT/ML FlexTouch Pen, Inject 10 Units into the skin at bedtime., Disp: , Rfl: :  :  No Known Allergies:  History reviewed. No pertinent family history.:   Social History   Socioeconomic History   Marital status: Divorced    Spouse name: Not on file   Number of children: 2   Years of education: 12   Highest education level: 12th grade  Occupational History    Comment: Print production planner  Tobacco Use   Smoking status: Former    Current packs/day: 0.00    Average packs/day: 0.5 packs/day for 30.0 years (15.0 ttl pk-yrs)    Types: Cigarettes    Start date: 11/22/1992    Quit date: 11/23/2022    Years since quitting: 0.9    Smokeless tobacco: Not on file  Vaping Use   Vaping status: Some Days   Substances: CBD   Devices: CBD vape occasionally  Substance and Sexual Activity   Alcohol use: Never   Drug use: No    Types: Marijuana    Comment: former marijuana smoker   Sexual activity: Not Currently  Other Topics Concern  Not on file  Social History Narrative   Not on file   Social Drivers of Health   Financial Resource Strain: Medium Risk (12/28/2022)   Overall Financial Resource Strain (CARDIA)    Difficulty of Paying Living Expenses: Somewhat hard  Food Insecurity: Low Risk  (03/06/2023)   Received from [facility]   Hunger Vital Sign    Worried About Running Out of Food in the Last Year: Never true    Ran Out of Food in the Last Year: Never true  Transportation Needs: Not on file (03/06/2023)  Physical Activity: Inactive (12/28/2022)   Exercise Vital Sign    Days of Exercise per Week: 0 days    Minutes of Exercise per Session: 0 min  Stress: Stress Concern Present (12/28/2022)   [employer] of Occupational Health - Occupational Stress Questionnaire    Feeling of Stress : Rather much  Social Connections: Moderately Isolated (12/28/2022)   Social Connection and Isolation Panel [NHANES]    Frequency of Communication with Friends and Family: More than three times a week    Frequency of Social Gatherings with Friends and Family: More than three times a week    Attends Religious Services: 1 to 4 times per year    Active Member of [employer] or Organizations: No    Attends Banker Meetings: Never    Marital Status: Divorced  Catering manager Violence: Not At Risk (12/12/2022)   Humiliation, Afraid, Rape, and Kick questionnaire    Fear of Current or Ex-Partner: No    Emotionally Abused: No    Physically Abused: No    Sexually Abused: No  :  Review of Systems  Constitutional:  Positive for malaise/fatigue.  HENT:  Positive for sore throat.   Eyes: Negative.   Respiratory: Negative.     Cardiovascular:  Positive for leg swelling.  Gastrointestinal: Negative.   Genitourinary: Negative.   Musculoskeletal: Negative.   Skin:  Positive for itching and rash.  Neurological: Negative.   Endo/Heme/Allergies: Negative.   Psychiatric/Behavioral: Negative.       Exam: Vital signs show a temperature of 98.6.  Pulse 60.  Blood pressure is 166/101.  Weight is 310 pounds. @IPVITALS @ Physical Exam Vitals reviewed.  HENT:     Head: Normocephalic and atraumatic.  Eyes:     Pupils: Pupils are equal, round, and reactive to light.  Cardiovascular:     Rate and Rhythm: Normal rate and regular rhythm.     Heart sounds: Normal heart sounds.  Pulmonary:     Effort: Pulmonary effort is normal.     Breath sounds: Normal breath sounds.  Abdominal:     General: Bowel sounds are normal.     Palpations: Abdomen is soft.  Musculoskeletal:        General: No tenderness or deformity. Normal range of motion.     Cervical back: Normal range of motion.  Lymphadenopathy:     Cervical: No cervical adenopathy.  Skin:    General: Skin is warm and dry.     Findings: No erythema or rash.     Comments: Skin exam shows numerous papular lesions that are scarred over.  She has on her instep on the left foot and area that is quite large and the skin is eroded.  On the right lower leg, she has a large area that was biopsied.  This probably  measures about 3 x 4 cm.  She has some hyperpigmented nodule at the rim of this lesion.  There is some  scaling on this.  Neurological:     Mental Status: She is alert and oriented to person, place, and time.  Psychiatric:        Behavior: Behavior normal.        Thought Content: Thought content normal.        Judgment: Judgment normal.      Recent Labs    11/10/23 1059  WBC 12.9*  HGB 11.8*  HCT 36.4  PLT 330    Recent Labs    11/10/23 1059  NA 142  K 4.1  CL 103  CO2 31  GLUCOSE 164*  BUN 21*  CREATININE 0.91  CALCIUM 9.4    Blood smear  review: None  Pathology: See above    Assessment and Plan:   Diana Odom is a very nice 48 year old white female.  She has a history of melanoma.  She has at least recurrent melanoma in the right leg.  This was recently biopsied.  Again, we really need to have the BRAF status.  I think the bigger problem is clearly the skin issues.  I really am worried about her feet.  She has skin that is totally denuded from the instep of her left foot.  I think this is a problem that she really needs to be seen by a academic dermatologic clinic.  We will see about making a referral out to [facility].  I know that they are very very good in managing skin toxicity from our treatments.  I just feel bad for her.  Again, she looks has recurrent melanoma.  I just do not think this is the active issue right now.  I will be in the near future.  Again, I will see about give her some for her mouth.  I will give her some Peridex mouth rinse.  We will see if this may help.  Hopefully, we will be able to get her in to [facility] next week.  I think this skin issue is critical and she really needs to be seen.  I also told her to try to taper down on the prednisone.  I told her to try 40 mg a day alternating with 40 mg a day.  This is incredibly complex.  I spent over an hour and a half with she and her sister.  She is incredibly nice.  I  just feel horrible for her for what she is experiencing with her skin.

## 2023-11-11 ENCOUNTER — Other Ambulatory Visit: Payer: Self-pay

## 2023-11-13 ENCOUNTER — Encounter: Payer: Self-pay | Admitting: *Deleted

## 2023-11-13 ENCOUNTER — Telehealth: Payer: Self-pay | Admitting: Oncology

## 2023-11-13 NOTE — Progress Notes (Signed)
 Dr Myna Hidalgo would like patient seen by a dermatologist in [facility]. Called office and was able to schedule as follows:  Dr Mamie Laurel [address] [place] [address] [phone]  Thursday, February 27th at 9:45am.  Called and spoke to patient. She is aware of appointment date, time and location. Message also sent to MyChart for reinforcement of education.   Oncology Nurse Navigator Documentation     11/13/2023    9:00 AM  Oncology Nurse Navigator Flowsheets  Navigator Follow Up Date: 11/16/2023  Navigator Follow Up Reason: Review Note  Navigator Location [facility]  Navigator Encounter Type Telephone  Telephone Outgoing Call  Patient Visit Type MedOnc  Treatment Phase Active Tx  Barriers/Navigation Needs Coordination of Care;Education  Education Other  Interventions Coordination of Care;Education;Referrals  Acuity Level 2-Minimal Needs (1-2 Barriers Identified)  Referrals Other  Coordination of Care Other  Education Method Verbal;Written  Support Groups/Services Friends and Family  Time Spent with Patient 45

## 2023-11-13 NOTE — Telephone Encounter (Signed)
 11/13/23 Patient requested that all appts be cancelled.She has relocated to [facility] and will be seen at the [facility].

## 2023-11-14 ENCOUNTER — Encounter: Payer: Self-pay | Admitting: Oncology

## 2023-11-15 ENCOUNTER — Inpatient Hospital Stay: Payer: 59 | Admitting: Licensed Clinical Social Worker

## 2023-11-15 ENCOUNTER — Inpatient Hospital Stay: Payer: 59 | Admitting: Oncology

## 2023-11-15 ENCOUNTER — Inpatient Hospital Stay: Payer: 59

## 2023-11-15 NOTE — Progress Notes (Signed)
 [facility] Clinical Social Work  Initial Assessment   Diana Odom is a 48 y.o. year old female contacted by phone. Clinical Social Work was referred by nurse navigator for assessment of psychosocial needs.   SDOH (Social Determinants of Health) assessments performed: Yes SDOH Interventions    Flowsheet Row Clinical Support from 12/28/2022 in [facility] - A Dept Of [place]. [facility] Office Visit from 12/12/2022 in [facility]  - A Dept Of [place]. [facility]  SDOH Interventions    Food Insecurity Interventions -- Intervention Not Indicated  Housing Interventions -- Intervention Not Indicated  Transportation Interventions -- Intervention Not Indicated  Utilities Interventions -- Intervention Not Indicated  Alcohol Usage Interventions -- Intervention Not Indicated (Score <7)  Financial Strain Interventions Artist, Other (Comment)  [referral to grant fund manager] --  Physical Activity Interventions Intervention Not Indicated --  Stress Interventions Provide Counseling --  Social Connections Interventions Intervention Not Indicated --       SDOH Screenings   Food Insecurity: Low Risk  (03/06/2023)   Received from [facility]  Housing: Low Risk  (03/06/2023)   Received from [facility]  Utilities: Low Risk  (03/06/2023)   Received from [facility]  Alcohol Screen: Low Risk  (12/12/2022)  Depression (PHQ2-9): Low Risk  (12/12/2022)  Financial Resource Strain: Medium Risk (12/28/2022)  Physical Activity: Inactive (12/28/2022)  Social Connections: Moderately Isolated (12/28/2022)  Stress: Stress Concern Present (12/28/2022)  Tobacco Use: Medium Risk (11/10/2023)     Distress Screen completed: No    12/12/2022    2:22 PM  ONCBCN DISTRESS SCREENING  Screening Type Initial Screening  Distress experienced in past week (1-10) 0      Family/Social Information:  Housing Arrangement: patient lives with her sister, Diana Odom. Family members/support persons in your life? Family and Friends Transportation concerns: no  Employment: Working full time as a Production designer, theatre/television/film at [employer]. Income source: Employment Financial concerns: Yes, due to illness and/or loss of work during treatment Type of concern: Aeronautical engineer access concerns: no Religious or spiritual practice: Yes-Patient identifies as Media planner. Advanced directives: No Services Currently in place:  [employer]  Coping/ Adjustment to diagnosis: Patient understands treatment plan and what happens next? yes Concerns about diagnosis and/or treatment: How I will pay for the services I need Patient reported stressors: Finances Hopes and/or priorities: Health Care. Patient enjoys time with family/ friends Current coping skills/ strengths: Average or above average intelligence , Capable of independent living , Communication skills , General fund of knowledge , and Supportive family/friends     SUMMARY: Current SDOH Barriers:  Financial constraints related to transportation cost.  Clinical Social Work Clinical Goal(s):  Explore community resource options for unmet needs related to:  Financial Strain   Interventions: Discussed common feeling and emotions when being diagnosed with cancer, and the importance of support during treatment Informed patient of the support team roles and support services at [facility] Provided CSW contact information and encouraged patient to call with any questions or concerns Provided patient with information about CSW role and the [employer].  Patient stated she received gas cards from [facility] in the past.  CSW to contact financial counselor to verify if patient has a balance.  She said she will resume treatment with Dr. Myna Hidalgo.   Follow Up Plan: CSW will follow-up with patient by phone  Patient verbalizes understanding of plan: Yes    Dorothey Baseman, LCSW Clinical Social  Worker [employer]

## 2023-11-16 DIAGNOSIS — L438 Other lichen planus: Secondary | ICD-10-CM | POA: Diagnosis not present

## 2023-11-16 DIAGNOSIS — R21 Rash and other nonspecific skin eruption: Secondary | ICD-10-CM | POA: Diagnosis not present

## 2023-11-21 ENCOUNTER — Encounter: Payer: Self-pay | Admitting: *Deleted

## 2023-11-21 NOTE — Progress Notes (Signed)
 Received path results from previous request. Patient is BRAF positive. Shared with Dr Myna Hidalgo. We are also still waiting from skin biopsies from the dermatologist last week. Dr Myna Hidalgo would like to see the patient next week to discuss treatment.   Called and spoke to [place]. She is out of town next week. Scheduled for 12/04/2023. She is aware of time, date and location.   Oncology Nurse Navigator Documentation     11/21/2023   12:30 PM  Oncology Nurse Navigator Flowsheets  Navigator Follow Up Date: 12/04/2023  Navigator Follow Up Reason: Follow-up Appointment  Navigator Location [facility]  Navigator Encounter Type Pathology Review;Telephone  Telephone Outgoing Call  Patient Visit Type MedOnc  Treatment Phase Active Tx  Barriers/Navigation Needs Coordination of Care;Education  Education Other  Interventions Coordination of Care;Education  Acuity Level 2-Minimal Needs (1-2 Barriers Identified)  Coordination of Care Appts  Education Method Verbal  Support Groups/Services Friends and Family  Time Spent with Patient 30

## 2023-11-22 DIAGNOSIS — G4733 Obstructive sleep apnea (adult) (pediatric): Secondary | ICD-10-CM | POA: Diagnosis not present

## 2023-11-22 DIAGNOSIS — I10 Essential (primary) hypertension: Secondary | ICD-10-CM | POA: Diagnosis not present

## 2023-11-23 ENCOUNTER — Other Ambulatory Visit: Payer: Self-pay | Admitting: *Deleted

## 2023-11-23 ENCOUNTER — Telehealth: Payer: Self-pay

## 2023-11-23 MED ORDER — TRAMADOL HCL 50 MG PO TABS: 100.0000 mg | ORAL_TABLET | Freq: Four times a day (QID) | ORAL | 0 refills | Status: DC | PRN

## 2023-11-23 NOTE — Telephone Encounter (Signed)
 Referral received from Child psychotherapist. Attempted to contact the patient by phone, but there was no answer. Left a voicemail requesting a call back.

## 2023-11-24 ENCOUNTER — Encounter: Payer: Self-pay | Admitting: *Deleted

## 2023-11-24 NOTE — Progress Notes (Signed)
 Skin biopsies have resulted from patient visit with the dermatologist.   Final Diagnosis   A. SKIN, RIGHT HAND, PUNCH BIOPSY:               CHANGES CONSISTENT WITH LICHEN PLANUS PEMPHIGOIDES (SEE COMMENT)   B. SKIN, RIGHT HAND, DIRECT IMMUNOFLUORESCENCE:               POSITIVE FOR LINEAR DEPOSITION OF IgG AND C3 AT THE BASEMENT MEMBRANE (SEE COMMENT)   C. SKIN, LEFT FOOT, PUNCH BIOPSY:               CHANGES CONSISTENT WITH LICHEN PLANUS PEMPHIGOIDES (SEE COMMENT)   Reviewed with Dr Myna Hidalgo. No additional need at this time.   Oncology Nurse Navigator Documentation     11/24/2023    9:15 AM  Oncology Nurse Navigator Flowsheets  Navigator Follow Up Date: 12/04/2023  Navigator Follow Up Reason: Follow-up Appointment;Genetics Pending  Navigator Location [facility]  Navigator Encounter Type Pathology Review  Patient Visit Type MedOnc  Treatment Phase Active Tx  Barriers/Navigation Needs Coordination of Care;Education  Interventions None Required  Acuity Level 2-Minimal Needs (1-2 Barriers Identified)  Support Groups/Services Friends and Family  Time Spent with Patient 15

## 2023-12-01 ENCOUNTER — Other Ambulatory Visit: Payer: Self-pay | Admitting: *Deleted

## 2023-12-01 DIAGNOSIS — C4371 Malignant melanoma of right lower limb, including hip: Secondary | ICD-10-CM

## 2023-12-04 ENCOUNTER — Inpatient Hospital Stay (HOSPITAL_BASED_OUTPATIENT_CLINIC_OR_DEPARTMENT_OTHER): Admitting: Hematology & Oncology

## 2023-12-04 ENCOUNTER — Encounter: Payer: Self-pay | Admitting: Hematology & Oncology

## 2023-12-04 ENCOUNTER — Inpatient Hospital Stay

## 2023-12-04 ENCOUNTER — Inpatient Hospital Stay: Attending: Oncology

## 2023-12-04 VITALS — BP 157/80 | HR 82 | Temp 98.1°F | Resp 18 | Ht 65.0 in | Wt 295.0 lb

## 2023-12-04 DIAGNOSIS — C4371 Malignant melanoma of right lower limb, including hip: Secondary | ICD-10-CM | POA: Diagnosis not present

## 2023-12-04 DIAGNOSIS — L438 Other lichen planus: Secondary | ICD-10-CM | POA: Insufficient documentation

## 2023-12-04 LAB — CBC WITH DIFFERENTIAL (CANCER CENTER ONLY)
Abs Immature Granulocytes: 0.07 10*3/uL (ref 0.00–0.07)
Basophils Absolute: 0.1 10*3/uL (ref 0.0–0.1)
Basophils Relative: 1 %
Eosinophils Absolute: 0.2 10*3/uL (ref 0.0–0.5)
Eosinophils Relative: 1 %
HCT: 36.5 % (ref 36.0–46.0)
Hemoglobin: 11.7 g/dL — ABNORMAL LOW (ref 12.0–15.0)
Immature Granulocytes: 1 %
Lymphocytes Relative: 23 %
Lymphs Abs: 2.7 10*3/uL (ref 0.7–4.0)
MCH: 26.8 pg (ref 26.0–34.0)
MCHC: 32.1 g/dL (ref 30.0–36.0)
MCV: 83.5 fL (ref 80.0–100.0)
Monocytes Absolute: 0.8 10*3/uL (ref 0.1–1.0)
Monocytes Relative: 7 %
Neutro Abs: 8.2 10*3/uL — ABNORMAL HIGH (ref 1.7–7.7)
Neutrophils Relative %: 67 %
Platelet Count: 363 10*3/uL (ref 150–400)
RBC: 4.37 MIL/uL (ref 3.87–5.11)
RDW: 13.7 % (ref 11.5–15.5)
WBC Count: 12.1 10*3/uL — ABNORMAL HIGH (ref 4.0–10.5)
nRBC: 0 % (ref 0.0–0.2)

## 2023-12-04 LAB — CMP (CANCER CENTER ONLY)
ALT: 12 U/L (ref 0–44)
AST: 6 U/L — ABNORMAL LOW (ref 15–41)
Albumin: 3.6 g/dL (ref 3.5–5.0)
Alkaline Phosphatase: 89 U/L (ref 38–126)
Anion gap: 8 (ref 5–15)
BUN: 22 mg/dL — ABNORMAL HIGH (ref 6–20)
CO2: 28 mmol/L (ref 22–32)
Calcium: 8.6 mg/dL — ABNORMAL LOW (ref 8.9–10.3)
Chloride: 101 mmol/L (ref 98–111)
Creatinine: 0.87 mg/dL (ref 0.44–1.00)
GFR, Estimated: >60 mL/min (ref >60)
Glucose, Bld: 218 mg/dL — ABNORMAL HIGH (ref 70–99)
Potassium: 3.4 mmol/L — ABNORMAL LOW (ref 3.5–5.1)
Sodium: 137 mmol/L (ref 135–145)
Total Bilirubin: 0.4 mg/dL (ref 0.0–1.2)
Total Protein: 6 g/dL — ABNORMAL LOW (ref 6.5–8.1)

## 2023-12-04 LAB — TSH: TSH: 2.116 u[IU]/mL (ref 0.350–4.500)

## 2023-12-04 MED ORDER — SODIUM CHLORIDE 0.9% FLUSH
10.0000 mL | INTRAVENOUS | Status: DC | PRN
  Administered 2023-12-04: 10 mL via INTRAVENOUS

## 2023-12-04 MED ORDER — HEPARIN SOD (PORK) LOCK FLUSH 100 UNIT/ML IV SOLN
500.0000 [IU] | Freq: Once | INTRAVENOUS | Status: AC
  Administered 2023-12-04: 500 [IU] via INTRAVENOUS

## 2023-12-04 MED ORDER — OXYCODONE-ACETAMINOPHEN 7.5-325 MG PO TABS: 1.0000 | ORAL_TABLET | Freq: Three times a day (TID) | ORAL | 0 refills | Status: DC | PRN

## 2023-12-04 MED ORDER — DABRAFENIB MESYLATE 75 MG PO CAPS: 150.0000 mg | ORAL_CAPSULE | Freq: Two times a day (BID) | ORAL | 6 refills | Status: DC

## 2023-12-04 MED ORDER — TRAMETINIB DIMETHYL SULFOXIDE 2 MG PO TABS: 2.0000 mg | ORAL_TABLET | Freq: Every day | ORAL | 6 refills | Status: DC

## 2023-12-04 NOTE — Patient Instructions (Signed)

## 2023-12-04 NOTE — Progress Notes (Addendum)
 Hematology and Oncology Follow Up Visit  Diana Odom [id number] 1976-07-23 47 y.o. 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  We first saw her back on 11/10/2023.  At that time, she had incredibly unique and severe tight skin toxicity from immunotherapy.  We got her set up to [facility]  As always, they do a fantastic job.  I saw her.  They did biopsies.  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  She was put on some methotrexate.  I think she is also put on some tacrolimus mouth rinse which unfortunately her insurance would not cover.  She does feel better.  She still does have a lot of difficulty with her feet.  She is going to see the wound care clinic.  She has terrible peeling of her feet.  She is unable to get around at home.  She is not able to really put pressure on her feet because of the skin breakdown and because of the blistering.  Clearly, the use of a wheelchair will definitely aid and improve her quality of life so she can get around and have some more independence at home.  We did send off the BRAF analysis of her biopsy.  Thankfully, this came back positive for the BRAF mutation.  As such, I think we certainly have the opportunity to use targeted therapy.  She does feel a little bit better.  She does not feel as tired.  She does have a lot of bit of pain in the feet.  We will have to get her on some little bit stronger than tramadol.  I will send in some Percocet for her.  At least, hopefully this will allow her to work.  We really need to get a PET scan on her recurrent melanoma.  I really need to see exactly how extensive this is.  She has had no fever.  There  has been no bleeding.  She has had no obvious change in bowel or bladder habits.  I think she will need to have a wheelchair.  Again is hard for her to go walk and get around any significant distance.  I would have to say that her performance status right now is probably ECOG 1-2.   Medications:  Current Outpatient Medications:    ARIPiprazole (ABILIFY) 2 MG tablet, Take 2 mg by mouth daily., Disp: , Rfl:    atorvastatin (LIPITOR) 10 MG tablet, Take 10 mg by mouth at bedtime., Disp: , Rfl:    BD PEN NEEDLE MICRO U/F 32G X 6 MM MISC, SMARTSIG:1 pen needle SUB-Q Every Other Day, Disp: , Rfl:    ciprofloxacin (CIPRO) 750 MG tablet, Take 750 mg by mouth 2 (two) times daily., Disp: , Rfl:    Continuous Glucose Sensor (DEXCOM G7 SENSOR) MISC, 1 (ONE) EACH EVERY 10 DAYS, Disp: , Rfl:    FIASP FLEXTOUCH 100 UNIT/ML FlexTouch Pen, , Disp: , Rfl:    FLUoxetine (PROZAC) 40 MG capsule, Take 40 mg by mouth daily., Disp: , Rfl:    folic acid (FOLVITE) 1 MG tablet, Take 1 tablet by mouth daily., Disp: , Rfl:    hydrochlorothiazide (HYDRODIURIL) 25 MG tablet, Take by mouth daily., Disp: , Rfl:    lisinopril (  ZESTRIL) 20 MG tablet, Take 1 tablet by mouth daily., Disp: , Rfl:    metFORMIN (GLUCOPHAGE) 1000 MG tablet, Take 1 tablet (1,000 mg total) by mouth 2 (two) times daily with a meal., Disp: 60 tablet, Rfl: 1   metoprolol succinate (TOPROL-XL) 25 MG 24 hr tablet, Take by mouth daily., Disp: , Rfl:    montelukast (SINGULAIR) 10 MG tablet, Take 10 mg by mouth daily., Disp: , Rfl:    NOVOLOG FLEXPEN 100 UNIT/ML FlexPen, Inject into the skin as directed. Sliding scale, Disp: , Rfl:    oxyCODONE-acetaminophen (PERCOCET) 7.5-325 MG tablet, Take 1 tablet by mouth every 8 (eight) hours as needed for severe pain (pain score 7-10)., Disp: 60 tablet, Rfl: 0   OZEMPIC, 2 MG/DOSE, 8 MG/3ML SOPN, Inject 2 mg into the skin once a week., Disp: , Rfl:    potassium chloride (MICRO-K) 10 MEQ CR capsule, Take 10 mEq by mouth  2 (two) times daily., Disp: , Rfl:    predniSONE (DELTASONE) 10 MG tablet, Take 40 mg by mouth daily with breakfast., Disp: , Rfl:    TRESIBA FLEXTOUCH 100 UNIT/ML FlexTouch Pen, Inject 10 Units into the skin at bedtime., Disp: , Rfl:    albuterol (VENTOLIN HFA) 108 (90 Base) MCG/ACT inhaler, Inhale 2 puffs into the lungs every 6 (six) hours as needed for wheezing or shortness of breath. (Patient not taking: Reported on 12/04/2023), Disp: 8 g, Rfl: 2   chlorhexidine (PERIDEX) 0.12 % solution, Use as directed 15 mLs in the mouth or throat 4 (four) times daily. (Patient not taking: Reported on 12/04/2023), Disp: 500 mL, Rfl: 0   clobetasol (TEMOVATE) 0.05 % external solution, Apply 1 Application topically 2 (two) times daily. Apply to scalp 2 x per day as needed (Patient not taking: Reported on 12/04/2023), Disp: 50 mL, Rfl: 3   hydrOXYzine (ATARAX) 25 MG tablet, Take 1 tablet (25 mg total) by mouth every 4 (four) hours as needed. (Patient not taking: Reported on 12/04/2023), Disp: 60 tablet, Rfl: 5   lidocaine-prilocaine (EMLA) cream, Apply to affected area once (Patient not taking: Reported on 12/04/2023), Disp: 30 g, Rfl: 3   methotrexate (RHEUMATREX) 2.5 MG tablet, Take 2.5 mg by mouth once a week. 12/04/2023 Has not started yet. (Patient not taking: Reported on 12/04/2023), Disp: , Rfl:    mupirocin cream (BACTROBAN) 2 %, Apply 1 Application topically 2 (two) times daily. (Patient not taking: Reported on 12/04/2023), Disp: , Rfl:    nystatin cream (MYCOSTATIN), Apply 1 Application topically 2 (two) times daily. (Patient not taking: Reported on 12/04/2023), Disp: , Rfl:    ondansetron (ZOFRAN) 8 MG tablet, Take 1 tablet (8 mg total) by mouth every 8 (eight) hours as needed for nausea or vomiting. (Patient not taking: Reported on 12/04/2023), Disp: 30 tablet, Rfl: 1   prochlorperazine (COMPAZINE) 10 MG tablet, Take 1 tablet (10 mg total) by mouth every 6 (six) hours as needed for nausea or vomiting. (Patient not  taking: Reported on 12/04/2023), Disp: 30 tablet, Rfl: 1   tacrolimus (PROGRAF) 1 MG capsule, 12/04/2023 States is not taken d/t pharmacy not filling. (Patient not taking: Reported on 12/04/2023), Disp: , Rfl:   Allergies: No Known Allergies  Past Medical History, Surgical history, Social history, and Family History were reviewed and updated.  Review of Systems: Review of Systems  Constitutional:  Positive for fatigue.  HENT:  Negative.    Eyes: Negative.   Respiratory: Negative.    Cardiovascular:  Positive for leg swelling.  Gastrointestinal: Negative.   Endocrine: Negative.   Musculoskeletal:  Positive for flank pain, gait problem and myalgias.  Skin:  Positive for rash and wound.  Neurological:  Positive for gait problem.  Hematological: Negative.   Psychiatric/Behavioral: Negative.      Physical Exam:  height is 5\' 5"  (1.651 m) and weight is 295 lb (133.8 kg). Her oral temperature is 98.1 F (36.7 C). Her blood pressure is 157/80 (abnormal) and her pulse is 82. Her respiration is 18 and oxygen saturation is 100%.   Wt Readings from Last 3 Encounters:  12/04/23 295 lb (133.8 kg)  11/10/23 (!) 310 lb (140.6 kg)  10/03/23 (!) 305 lb 1.6 oz (138.4 kg)    Physical Exam Vitals reviewed.  HENT:     Head: Normocephalic and atraumatic.     Mouth/Throat:     Comments: She has ulcerations on her tongue. Eyes:     Pupils: Pupils are equal, round, and reactive to light.  Cardiovascular:     Rate and Rhythm: Normal rate and regular rhythm.     Heart sounds: Normal heart sounds.  Pulmonary:     Effort: Pulmonary effort is normal.     Breath sounds: Normal breath sounds.  Abdominal:     General: Bowel sounds are normal.     Palpations: Abdomen is soft.  Musculoskeletal:        General: No tenderness or deformity. Normal range of motion.     Cervical back: Normal range of motion.  Lymphadenopathy:     Cervical: No cervical adenopathy.  Skin:    General: Skin is warm and dry.      Findings: No erythema or rash.     Comments: Skin exam does show these papular lesions that are healing up.  They are on her extremities.  Neurological:     Mental Status: She is alert and oriented to person, place, and time.  Psychiatric:        Behavior: Behavior normal.        Thought Content: Thought content normal.        Judgment: Judgment normal.      Lab Results  Component Value Date   WBC 12.1 (H) 12/04/2023   HGB 11.7 (L) 12/04/2023   HCT 36.5 12/04/2023   MCV 83.5 12/04/2023   PLT 363 12/04/2023     Chemistry      Component Value Date/Time   NA 137 12/04/2023 1320   K 3.4 (L) 12/04/2023 1320   CL 101 12/04/2023 1320   CO2 28 12/04/2023 1320   BUN 22 (H) 12/04/2023 1320   CREATININE 0.87 12/04/2023 1320      Component Value Date/Time   CALCIUM 8.6 (L) 12/04/2023 1320   ALKPHOS 89 12/04/2023 1320   AST 6 (L) 12/04/2023 1320   ALT 12 12/04/2023 1320   BILITOT 0.4 12/04/2023 1320      Impression and Plan: Diana Odom is a very nice 48 year old white female.  She has a history of recurrent melanoma.  Again, I am still not sure exactly how much recurrence she has.  The biopsy that we did have was of the lesion on her right leg.  We really have to get a PET scan on her.  The last PET scan that was done was back in January.  Again I think she would be a candidate for Mekinist/Tafinlar.  I do not see a problem with using this in addition to her being on the methotrexate.  Again, this is all  about quality of life.  I does want her to have a decent quality of life.  For her, this means that she is able to work.  Hopefully, the skin issues will improve.  I know she had been on immunotherapy for quite a while.  As such, it may take a while for the immunotherapy to get out of her system.  I told her that it may take 3 or 4 months.  It will be interesting to see what the Wound Care Clinic can do for her feet.  I would like to try to get her back in a month.  We  are going to have to follow her closely.  We will try to get the PET scan in a couple weeks.     Josph Macho, MD 3/17/20255:07 PM

## 2023-12-05 ENCOUNTER — Telehealth: Payer: Self-pay | Admitting: Pharmacy Technician

## 2023-12-05 ENCOUNTER — Telehealth: Payer: Self-pay | Admitting: Pharmacist

## 2023-12-05 ENCOUNTER — Other Ambulatory Visit (HOSPITAL_COMMUNITY): Payer: Self-pay

## 2023-12-05 ENCOUNTER — Encounter: Payer: Self-pay | Admitting: Oncology

## 2023-12-05 DIAGNOSIS — C4371 Malignant melanoma of right lower limb, including hip: Secondary | ICD-10-CM

## 2023-12-05 LAB — T4: T4, Total: 7.9 ug/dL (ref 4.5–12.0)

## 2023-12-05 MED ORDER — TRAMETINIB DIMETHYL SULFOXIDE 2 MG PO TABS: 2.0000 mg | ORAL_TABLET | Freq: Every day | ORAL | 6 refills | Status: DC

## 2023-12-05 MED ORDER — DABRAFENIB MESYLATE 75 MG PO CAPS: 150.0000 mg | ORAL_CAPSULE | Freq: Two times a day (BID) | ORAL | 6 refills | Status: DC

## 2023-12-05 NOTE — Telephone Encounter (Signed)
 Oral Oncology Patient Advocate Encounter  Prior Authorization for Diana Odom has been approved.    PA#  [id number] Effective dates: 12/05/2023 through 12/04/2024  [facility].  Patty Almedia Balls, CPhT Oncology Pharmacy Patient Advocate [facility] Direct Number: [phone] Fax: [phone]

## 2023-12-05 NOTE — Telephone Encounter (Signed)
 Oral Oncology Patient Advocate Encounter   Received notification that prior authorization for Tafinlar is required.   PA submitted on 12/05/2023 Key [id number] Status is pending     Patty Almedia Balls, CPhT Oncology Pharmacy Patient Advocate [facility] Direct Number: [phone] Fax: [phone]

## 2023-12-05 NOTE — Telephone Encounter (Signed)
 Oral Oncology Patient Advocate Encounter  Prior Authorization for Mekinist has been approved.    PA# [id number] Effective dates: 12/05/2023 through 12/04/2024  [facility]  Patty Almedia Balls, CPhT Oncology Pharmacy Patient Advocate [facility] Direct Number: [phone] Fax: [phone]

## 2023-12-05 NOTE — Telephone Encounter (Signed)
 Oral Oncology Patient Advocate Encounter   Received notification that prior authorization for Mekinist is required.   PA submitted on 12/05/2023 Key [id number] Status is pending     Patty Almedia Balls, CPhT Oncology Pharmacy Patient Advocate [facility] Direct Number: [phone] Fax: [phone]

## 2023-12-05 NOTE — Telephone Encounter (Signed)
 Oral Oncology Pharmacist Encounter  Received new prescriptions for Tafinlar (dabrafenib) and Mekinist (trametinib) for the treatment of melanoma, BRAF V600E mutated, planned duration until disease progression or unacceptable drug toxicity.  CBC w/ Diff and CMP from 12/04/23 assessed, no relevant lab abnormalities requiring baseline dose adjustment required at this time. Noted patient had glucose of 218 mg/dL while in clinic - recommend monitoring while on therapy as Tafinlar can cause increase in glucose and adjusting patient's insulin, metformin, etc as needed in the future. Will see if MD wants patient to have baseline ECHO prior to starting Tafinlar and Mekinist. Prescription dose and frequency assessed for appropriateness.  Current medication list in Epic reviewed, DDIs with Tafinlar identified: Category C drug-drug interaction between Tafinlar and Abilify - Tafinlar, a moderate CYP3A4 inducer may decrease serum concentrations of Abilify. No baseline dose adjustments required at this time, but would recommend monitoring for decreased efficacy of Abilify and dose adjust in the future if needed.  Category C drug-drug interaction between Tafinlar and Atorvastatin - Tafinlar, a moderate CYP3A4 inducer may decrease serum concentrations of Atorvastatin. No baseline dose adjustments required at this time.  Category C drug-drug interaction between Tafinlar and Oxycodone - Tafinlar, a moderate CYP3A4 inducer may decrease serum concentrations of Oxycodone - no changes in therapy required at this time, but recommend monitoring for decreased pain control and adjust oxycodone dose if necessary in the future.  Category C drug-drug interaction between Tafinlar and Prednisone - Tafinlar, a moderate CYP3A4 inducer may decrease serum concentrations of prednisone. Monitor patient for decreased efficacy of prednisone, but no changes in therapy warranted at this time.   Evaluated chart and no patient barriers to  medication adherence noted.   Patient's insurance requires that Tafinlar and [facility]ek[facility]ist [facility] - prescription has been redirected for dispensing.   Oral Oncology Clinic will continue to follow for insurance authorization, copayment issues, initial counseling and start date.  Sherry Ruffing, PharmD, BCPS, BCOP Hematology/Oncology Clinical Pharmacist [facility] Oral Chemotherapy Navigation Clinics [phone] 12/05/2023 1:14 PM

## 2023-12-06 ENCOUNTER — Encounter: Payer: Self-pay | Admitting: *Deleted

## 2023-12-06 ENCOUNTER — Other Ambulatory Visit: Payer: Self-pay

## 2023-12-06 ENCOUNTER — Telehealth: Payer: Self-pay | Admitting: Pharmacy Technician

## 2023-12-06 DIAGNOSIS — C4371 Malignant melanoma of right lower limb, including hip: Secondary | ICD-10-CM

## 2023-12-06 NOTE — Progress Notes (Unsigned)
 Patient will start on targeted therapy. She will need a PET for restaging and baseline Echo. Order entered.   Patient will need PET completed at [facility]. Today it is pending authorization has another physician had already obtained authorization for a PET. Patient was instructed by insurance specialist to call that provider and request that they remove the authorization request. Once authorization is obtained, will send orders to [facility] for scheduling.   Oncology Nurse Navigator Documentation     12/06/2023   10:00 AM  Oncology Nurse Navigator Flowsheets  Navigator Follow Up Date: 12/08/2023  Navigator Follow Up Reason: Appointment Review  Navigator Location [facility]  Navigator Encounter Type Appt/Treatment Plan Review;Telephone  Patient Visit Type MedOnc  Treatment Phase Active Tx  Barriers/Navigation Needs Coordination of Care;Education  Interventions Coordination of Care  Acuity Level 2-Minimal Needs (1-2 Barriers Identified)  Support Groups/Services Friends and Family  Time Spent with Patient 30

## 2023-12-06 NOTE — Telephone Encounter (Signed)
 Oral Oncology Patient Advocate Encounter  [facility] to check on status of medications. Representative stated that they have reached out to patient but she has not answered their calls.   I attempted to conference pt into the call but was forwarded to voicemail; I lvm stating that [facility] has been trying to get in contact with her regarding her medications and to please reach back out to them to set up or contact me with any relevant questions.   I will continue to follow up until pt receives her medications.  Patty Almedia Balls, CPhT Oncology Pharmacy Patient Advocate [facility] Direct Number: [phone] Fax: [phone]

## 2023-12-08 ENCOUNTER — Encounter: Payer: Self-pay | Admitting: *Deleted

## 2023-12-08 ENCOUNTER — Other Ambulatory Visit: Payer: Self-pay

## 2023-12-08 ENCOUNTER — Encounter: Payer: Self-pay | Admitting: Oncology

## 2023-12-08 NOTE — Progress Notes (Signed)
 Called and spoke to patient. She states she's had a hard week and not really been able to accomplish any of her additional tasks.   Spoke to her about her speciality drugs. She doesn't remember getting any phone call to set up delivery. I gave her [facility] number to call to arrange shipment.   PET and ECHO still need to be scheduled however there are delays with the PET and prior auth. Will continue to follow for scheduling.   Oncology Nurse Navigator Documentation     12/08/2023    2:00 PM  Oncology Nurse Navigator Flowsheets  Navigator Follow Up Date: 12/11/2023  Navigator Follow Up Reason: Appointment Review  Navigator Location [facility]  Navigator Encounter Type Telephone  Telephone Outgoing Call  Patient Visit Type MedOnc  Treatment Phase Active Tx  Barriers/Navigation Needs Coordination of Care;Education  Education Other  Interventions Education;Psycho-Social Support  Acuity Level 2-Minimal Needs (1-2 Barriers Identified)  Education Method Verbal  Support Groups/Services Friends and Family  Time Spent with Patient 15

## 2023-12-11 ENCOUNTER — Encounter: Payer: Self-pay | Admitting: *Deleted

## 2023-12-11 NOTE — Progress Notes (Signed)
 Auth for PET scan never obtained. Dr Myna Hidalgo had P2P today with a result of PET denial. He will place order for CT based on this denial.   Patient still needs to be scheduled for her ECHO. She has multiple appointments across multiple health systems. We spoke this afternoon and she will call [phone] to schedule this.   Called and spoke to patient. She has spoken to specialty pharmacy and is in the process of completing their enrollment forms. She will also call and schedule her ECHO. She is aware of PET denial.  Oncology Nurse Navigator Documentation     12/11/2023    2:30 PM  Oncology Nurse Navigator Flowsheets  Navigator Follow Up Date: 01/08/2024  Navigator Follow Up Reason: Follow-up Appointment  Navigator Location [facility]  Navigator Encounter Type Telephone  Telephone Outgoing Call  Patient Visit Type MedOnc  Treatment Phase Active Tx  Barriers/Navigation Needs Coordination of Care;Education  Education Other  Interventions Education;Psycho-Social Support  Acuity Level 2-Minimal Needs (1-2 Barriers Identified)  Education Method Verbal  Support Groups/Services Friends and Family  Time Spent with Patient 30

## 2023-12-12 ENCOUNTER — Ambulatory Visit (HOSPITAL_BASED_OUTPATIENT_CLINIC_OR_DEPARTMENT_OTHER)
Admission: RE | Admit: 2023-12-12 | Discharge: 2023-12-12 | Disposition: A | Discharge status: Home / Self Care | Source: Ambulatory Visit | Attending: Hematology & Oncology | Admitting: Hematology & Oncology

## 2023-12-12 DIAGNOSIS — I10 Essential (primary) hypertension: Secondary | ICD-10-CM | POA: Diagnosis not present

## 2023-12-12 DIAGNOSIS — C4371 Malignant melanoma of right lower limb, including hip: Secondary | ICD-10-CM | POA: Insufficient documentation

## 2023-12-12 MED ORDER — PERFLUTREN LIPID MICROSPHERE
1.0000 mL | INTRAVENOUS | Status: AC | PRN
  Administered 2023-12-12: 2 mL via INTRAVENOUS

## 2023-12-13 DIAGNOSIS — X58XXXD Exposure to other specified factors, subsequent encounter: Secondary | ICD-10-CM | POA: Diagnosis not present

## 2023-12-13 DIAGNOSIS — S81802D Unspecified open wound, left lower leg, subsequent encounter: Secondary | ICD-10-CM | POA: Diagnosis not present

## 2023-12-13 DIAGNOSIS — L438 Other lichen planus: Secondary | ICD-10-CM | POA: Diagnosis not present

## 2023-12-13 DIAGNOSIS — S91102D Unspecified open wound of left great toe without damage to nail, subsequent encounter: Secondary | ICD-10-CM | POA: Diagnosis not present

## 2023-12-13 DIAGNOSIS — S91104D Unspecified open wound of right lesser toe(s) without damage to nail, subsequent encounter: Secondary | ICD-10-CM | POA: Diagnosis not present

## 2023-12-13 DIAGNOSIS — S81801D Unspecified open wound, right lower leg, subsequent encounter: Secondary | ICD-10-CM | POA: Diagnosis not present

## 2023-12-13 DIAGNOSIS — R21 Rash and other nonspecific skin eruption: Secondary | ICD-10-CM | POA: Diagnosis not present

## 2023-12-13 DIAGNOSIS — S91302D Unspecified open wound, left foot, subsequent encounter: Secondary | ICD-10-CM | POA: Diagnosis not present

## 2023-12-13 DIAGNOSIS — S91105D Unspecified open wound of left lesser toe(s) without damage to nail, subsequent encounter: Secondary | ICD-10-CM | POA: Diagnosis not present

## 2023-12-13 DIAGNOSIS — S91101D Unspecified open wound of right great toe without damage to nail, subsequent encounter: Secondary | ICD-10-CM | POA: Diagnosis not present

## 2023-12-13 LAB — ECHOCARDIOGRAM COMPLETE
AR max vel: 3.23 cm2
AV Area VTI: 3.07 cm2
AV Area mean vel: 3.26 cm2
AV Mean grad: 5 mmHg
AV Peak grad: 9.5 mmHg
Ao pk vel: 1.54 m/s
Area-P 1/2: 3.02 cm2
Calc EF: 73.7 %
S' Lateral: 3.7 cm
Single Plane A2C EF: 72.7 %
Single Plane A4C EF: 76.6 %

## 2023-12-14 ENCOUNTER — Telehealth: Payer: Self-pay | Admitting: Dietician

## 2023-12-14 ENCOUNTER — Encounter: Payer: Self-pay | Admitting: *Deleted

## 2023-12-14 NOTE — Telephone Encounter (Signed)
 Patient screened on MST also seeing wound specialist. First attempt to reach. Provided my cell# on voice mail and in text to return call to set up a nutrition consult.  Gennaro Africa, RDN, LDN Registered Dietitian, [facility] Part Time Remote (Usual office hours: Tuesday-Thursday) Cell: [phone]

## 2023-12-14 NOTE — Telephone Encounter (Signed)
 Oral Oncology Patient Advocate Encounter  [facility] to receive an update on the status of pt's medication.   They have not been able to get in touch with her and have tried calling and sending text messages.  Patty Almedia Balls, CPhT Oncology Pharmacy Patient Advocate [facility] Direct Number: [phone] Fax: [phone]

## 2023-12-18 NOTE — Telephone Encounter (Signed)
 Oral Oncology Patient Advocate Encounter  [facility] to check on the status of pt's medication. Representative stated that patient did schedule delivery but they needed clarification on drug interaction between Tafinlar and Tacrolimus.  I have contacted the patient and LVM to call me back so I can verify delivery was successful.   Patty Almedia Balls, CPhT Oncology Pharmacy Patient Advocate [facility] Direct Number: [phone] Fax: [phone]

## 2023-12-20 ENCOUNTER — Other Ambulatory Visit: Payer: Self-pay | Admitting: *Deleted

## 2023-12-20 DIAGNOSIS — Z7952 Long term (current) use of systemic steroids: Secondary | ICD-10-CM | POA: Diagnosis not present

## 2023-12-20 DIAGNOSIS — K121 Other forms of stomatitis: Secondary | ICD-10-CM | POA: Diagnosis not present

## 2023-12-20 DIAGNOSIS — Z79631 Long term (current) use of antimetabolite agent: Secondary | ICD-10-CM | POA: Diagnosis not present

## 2023-12-20 DIAGNOSIS — Z7984 Long term (current) use of oral hypoglycemic drugs: Secondary | ICD-10-CM | POA: Diagnosis not present

## 2023-12-20 DIAGNOSIS — Z794 Long term (current) use of insulin: Secondary | ICD-10-CM | POA: Diagnosis not present

## 2023-12-20 DIAGNOSIS — I10 Essential (primary) hypertension: Secondary | ICD-10-CM | POA: Diagnosis not present

## 2023-12-20 DIAGNOSIS — S91101A Unspecified open wound of right great toe without damage to nail, initial encounter: Secondary | ICD-10-CM | POA: Diagnosis not present

## 2023-12-20 DIAGNOSIS — R21 Rash and other nonspecific skin eruption: Secondary | ICD-10-CM | POA: Diagnosis not present

## 2023-12-20 DIAGNOSIS — S91301D Unspecified open wound, right foot, subsequent encounter: Secondary | ICD-10-CM | POA: Diagnosis not present

## 2023-12-20 DIAGNOSIS — S91309A Unspecified open wound, unspecified foot, initial encounter: Secondary | ICD-10-CM | POA: Diagnosis not present

## 2023-12-20 DIAGNOSIS — S91302D Unspecified open wound, left foot, subsequent encounter: Secondary | ICD-10-CM | POA: Diagnosis not present

## 2023-12-20 DIAGNOSIS — E785 Hyperlipidemia, unspecified: Secondary | ICD-10-CM | POA: Diagnosis not present

## 2023-12-20 DIAGNOSIS — L97412 Non-pressure chronic ulcer of right heel and midfoot with fat layer exposed: Secondary | ICD-10-CM | POA: Diagnosis not present

## 2023-12-20 DIAGNOSIS — Z87891 Personal history of nicotine dependence: Secondary | ICD-10-CM | POA: Diagnosis not present

## 2023-12-20 DIAGNOSIS — Z79899 Other long term (current) drug therapy: Secondary | ICD-10-CM | POA: Diagnosis not present

## 2023-12-20 DIAGNOSIS — E11621 Type 2 diabetes mellitus with foot ulcer: Secondary | ICD-10-CM | POA: Diagnosis not present

## 2023-12-20 DIAGNOSIS — R600 Localized edema: Secondary | ICD-10-CM | POA: Diagnosis not present

## 2023-12-20 DIAGNOSIS — L97422 Non-pressure chronic ulcer of left heel and midfoot with fat layer exposed: Secondary | ICD-10-CM | POA: Diagnosis not present

## 2023-12-20 DIAGNOSIS — L438 Other lichen planus: Secondary | ICD-10-CM | POA: Diagnosis not present

## 2023-12-20 MED ORDER — OXYCODONE-ACETAMINOPHEN 7.5-325 MG PO TABS: 1.0000 | ORAL_TABLET | Freq: Three times a day (TID) | ORAL | 0 refills | Status: DC | PRN

## 2023-12-20 NOTE — Telephone Encounter (Signed)
 Oral Chemotherapy Pharmacist Encounter   Attempted to reach patient to provide update and offer for initial counseling on oral medication: Tafinlar (dabrafenib) and Mekinist (trametinib).   No answer. Left voicemail for patient to call [facility] at [phone] to set up shipment for new medications if she has not done so already. Also left my call back number for patient as well [phone]).  Sherry Ruffing, PharmD, BCPS, BCOP Hematology/Oncology Clinical Pharmacist [facility] Oral Chemotherapy Navigation Clinics [phone] 12/20/2023 9:57 AM

## 2023-12-22 NOTE — Telephone Encounter (Signed)
 Oral Chemotherapy Pharmacist Encounter   Spoke with patient today to follow up regarding patient's oral chemotherapy medication: Tafinlar (dabrafenib) and Mekinist (tafinlar)  Patient declined counseling as she stated she was counseled [facility]. Per patient medication is scheduled to deliver to her home on 12/29/23.  Oral chemotherapy clinic will sign off at this time.   Sherry Ruffing, PharmD, BCPS, BCOP Hematology/Oncology Clinical Pharmacist [facility] Oral Chemotherapy Navigation Clinics [phone] 12/22/2023 2:34 PM

## 2023-12-23 DIAGNOSIS — G4733 Obstructive sleep apnea (adult) (pediatric): Secondary | ICD-10-CM | POA: Diagnosis not present

## 2023-12-27 ENCOUNTER — Telehealth: Payer: Self-pay | Admitting: *Deleted

## 2023-12-27 NOTE — Telephone Encounter (Signed)
 Per fax from Perlie Mayo, Dr Myna Hidalgo amended his last office note with documentation of medical necessity for wheelchair.  Faxed to Perlie Mayo at [phone]

## 2023-12-29 DIAGNOSIS — C4371 Malignant melanoma of right lower limb, including hip: Secondary | ICD-10-CM | POA: Diagnosis not present

## 2023-12-29 DIAGNOSIS — Z6841 Body Mass Index (BMI) 40.0 and over, adult: Secondary | ICD-10-CM | POA: Diagnosis not present

## 2023-12-29 DIAGNOSIS — Z794 Long term (current) use of insulin: Secondary | ICD-10-CM | POA: Diagnosis not present

## 2023-12-29 DIAGNOSIS — I10 Essential (primary) hypertension: Secondary | ICD-10-CM | POA: Diagnosis not present

## 2023-12-29 DIAGNOSIS — E1165 Type 2 diabetes mellitus with hyperglycemia: Secondary | ICD-10-CM | POA: Diagnosis not present

## 2024-01-01 ENCOUNTER — Telehealth: Payer: Self-pay | Admitting: Dietician

## 2024-01-01 NOTE — Telephone Encounter (Signed)
 Called to schedule Nutrition appt. Unable to LVM for pt to return call for scheduling.

## 2024-01-03 ENCOUNTER — Ambulatory Visit: Payer: 59 | Admitting: Dermatology

## 2024-01-03 DIAGNOSIS — Z87891 Personal history of nicotine dependence: Secondary | ICD-10-CM | POA: Diagnosis not present

## 2024-01-03 DIAGNOSIS — L438 Other lichen planus: Secondary | ICD-10-CM | POA: Diagnosis not present

## 2024-01-03 DIAGNOSIS — E11621 Type 2 diabetes mellitus with foot ulcer: Secondary | ICD-10-CM | POA: Diagnosis not present

## 2024-01-03 DIAGNOSIS — S91201D Unspecified open wound of right great toe with damage to nail, subsequent encounter: Secondary | ICD-10-CM | POA: Diagnosis not present

## 2024-01-03 DIAGNOSIS — Z7952 Long term (current) use of systemic steroids: Secondary | ICD-10-CM | POA: Diagnosis not present

## 2024-01-03 DIAGNOSIS — S91202D Unspecified open wound of left great toe with damage to nail, subsequent encounter: Secondary | ICD-10-CM | POA: Diagnosis not present

## 2024-01-03 DIAGNOSIS — L97412 Non-pressure chronic ulcer of right heel and midfoot with fat layer exposed: Secondary | ICD-10-CM | POA: Diagnosis not present

## 2024-01-03 DIAGNOSIS — S91301D Unspecified open wound, right foot, subsequent encounter: Secondary | ICD-10-CM | POA: Diagnosis not present

## 2024-01-03 DIAGNOSIS — Z7984 Long term (current) use of oral hypoglycemic drugs: Secondary | ICD-10-CM | POA: Diagnosis not present

## 2024-01-03 DIAGNOSIS — L431 Bullous lichen planus: Secondary | ICD-10-CM | POA: Diagnosis not present

## 2024-01-03 DIAGNOSIS — S91101A Unspecified open wound of right great toe without damage to nail, initial encounter: Secondary | ICD-10-CM | POA: Diagnosis not present

## 2024-01-03 DIAGNOSIS — S91204D Unspecified open wound of right lesser toe(s) with damage to nail, subsequent encounter: Secondary | ICD-10-CM | POA: Diagnosis not present

## 2024-01-03 DIAGNOSIS — Z79631 Long term (current) use of antimetabolite agent: Secondary | ICD-10-CM | POA: Diagnosis not present

## 2024-01-03 DIAGNOSIS — R600 Localized edema: Secondary | ICD-10-CM | POA: Diagnosis not present

## 2024-01-03 DIAGNOSIS — S91205D Unspecified open wound of left lesser toe(s) with damage to nail, subsequent encounter: Secondary | ICD-10-CM | POA: Diagnosis not present

## 2024-01-03 DIAGNOSIS — Z794 Long term (current) use of insulin: Secondary | ICD-10-CM | POA: Diagnosis not present

## 2024-01-03 DIAGNOSIS — L97422 Non-pressure chronic ulcer of left heel and midfoot with fat layer exposed: Secondary | ICD-10-CM | POA: Diagnosis not present

## 2024-01-03 DIAGNOSIS — S91302D Unspecified open wound, left foot, subsequent encounter: Secondary | ICD-10-CM | POA: Diagnosis not present

## 2024-01-03 DIAGNOSIS — Z79899 Other long term (current) drug therapy: Secondary | ICD-10-CM | POA: Diagnosis not present

## 2024-01-05 ENCOUNTER — Telehealth: Payer: Self-pay | Admitting: Dietician

## 2024-01-05 NOTE — Telephone Encounter (Signed)
 Called to schedule nutrition appt per inbasket. LVM to return call for scheduling.

## 2024-01-08 ENCOUNTER — Encounter: Payer: Self-pay | Admitting: Hematology & Oncology

## 2024-01-08 ENCOUNTER — Encounter: Payer: Self-pay | Admitting: *Deleted

## 2024-01-08 ENCOUNTER — Inpatient Hospital Stay (HOSPITAL_BASED_OUTPATIENT_CLINIC_OR_DEPARTMENT_OTHER): Admitting: Hematology & Oncology

## 2024-01-08 ENCOUNTER — Inpatient Hospital Stay: Attending: Oncology

## 2024-01-08 ENCOUNTER — Other Ambulatory Visit: Payer: Self-pay

## 2024-01-08 ENCOUNTER — Inpatient Hospital Stay

## 2024-01-08 VITALS — BP 169/87 | Temp 98.8°F | Resp 18

## 2024-01-08 DIAGNOSIS — C4371 Malignant melanoma of right lower limb, including hip: Secondary | ICD-10-CM

## 2024-01-08 DIAGNOSIS — Z79899 Other long term (current) drug therapy: Secondary | ICD-10-CM | POA: Diagnosis not present

## 2024-01-08 DIAGNOSIS — Z95828 Presence of other vascular implants and grafts: Secondary | ICD-10-CM

## 2024-01-08 LAB — CMP (CANCER CENTER ONLY)
ALT: 17 U/L (ref 0–44)
AST: 9 U/L — ABNORMAL LOW (ref 15–41)
Albumin: 3.9 g/dL (ref 3.5–5.0)
Alkaline Phosphatase: 68 U/L (ref 38–126)
Anion gap: 12 (ref 5–15)
BUN: 30 mg/dL — ABNORMAL HIGH (ref 6–20)
CO2: 29 mmol/L (ref 22–32)
Calcium: 9.4 mg/dL (ref 8.9–10.3)
Chloride: 100 mmol/L (ref 98–111)
Creatinine: 0.73 mg/dL (ref 0.44–1.00)
GFR, Estimated: >60 mL/min (ref >60)
Glucose, Bld: 212 mg/dL — ABNORMAL HIGH (ref 70–99)
Potassium: 3.6 mmol/L (ref 3.5–5.1)
Sodium: 141 mmol/L (ref 135–145)
Total Bilirubin: 0.4 mg/dL (ref 0.0–1.2)
Total Protein: 6.1 g/dL — ABNORMAL LOW (ref 6.5–8.1)

## 2024-01-08 LAB — CBC WITH DIFFERENTIAL (CANCER CENTER ONLY)
Abs Immature Granulocytes: 0.17 10*3/uL — ABNORMAL HIGH (ref 0.00–0.07)
Basophils Absolute: 0.1 10*3/uL (ref 0.0–0.1)
Basophils Relative: 1 %
Eosinophils Absolute: 0.1 10*3/uL (ref 0.0–0.5)
Eosinophils Relative: 0 %
HCT: 38.6 % (ref 36.0–46.0)
Hemoglobin: 12.7 g/dL (ref 12.0–15.0)
Immature Granulocytes: 1 %
Lymphocytes Relative: 28 %
Lymphs Abs: 3.6 10*3/uL (ref 0.7–4.0)
MCH: 26.2 pg (ref 26.0–34.0)
MCHC: 32.9 g/dL (ref 30.0–36.0)
MCV: 79.8 fL — ABNORMAL LOW (ref 80.0–100.0)
Monocytes Absolute: 0.5 10*3/uL (ref 0.1–1.0)
Monocytes Relative: 4 %
Neutro Abs: 8.6 10*3/uL — ABNORMAL HIGH (ref 1.7–7.7)
Neutrophils Relative %: 66 %
Platelet Count: 308 10*3/uL (ref 150–400)
RBC: 4.84 MIL/uL (ref 3.87–5.11)
RDW: 14.4 % (ref 11.5–15.5)
WBC Count: 13.1 10*3/uL — ABNORMAL HIGH (ref 4.0–10.5)
nRBC: 0 % (ref 0.0–0.2)

## 2024-01-08 LAB — LACTATE DEHYDROGENASE: LDH: 150 U/L (ref 98–192)

## 2024-01-08 LAB — IRON AND IRON BINDING CAPACITY (CC-WL,HP ONLY)
Iron: 27 ug/dL — ABNORMAL LOW (ref 28–170)
Saturation Ratios: 7 % — ABNORMAL LOW (ref 10.4–31.8)
TIBC: 416 ug/dL (ref 250–450)
UIBC: 389 ug/dL (ref 148–442)

## 2024-01-08 LAB — RETICULOCYTES
Immature Retic Fract: 12.5 % (ref 2.3–15.9)
RBC.: 4.82 MIL/uL (ref 3.87–5.11)
Retic Count, Absolute: 76.6 10*3/uL (ref 19.0–186.0)
Retic Ct Pct: 1.6 % (ref 0.4–3.1)

## 2024-01-08 LAB — FERRITIN: Ferritin: 11 ng/mL (ref 11–307)

## 2024-01-08 MED ORDER — HEPARIN SOD (PORK) LOCK FLUSH 100 UNIT/ML IV SOLN
500.0000 [IU] | Freq: Once | INTRAVENOUS | Status: AC
  Administered 2024-01-08: 500 [IU] via INTRAVENOUS

## 2024-01-08 MED ORDER — OXYCODONE-ACETAMINOPHEN 7.5-325 MG PO TABS: 1.0000 | ORAL_TABLET | Freq: Three times a day (TID) | ORAL | 0 refills | Status: DC | PRN

## 2024-01-08 MED ORDER — SODIUM CHLORIDE 0.9% FLUSH
10.0000 mL | Freq: Once | INTRAVENOUS | Status: AC
  Administered 2024-01-08: 10 mL via INTRAVENOUS

## 2024-01-08 NOTE — Progress Notes (Signed)
 Patient comes in for routine follow up. She states her skin is slowly getting better. Her feet are wrapped, and she's in velcro boots, but she doesn't feel like they're improving. She still sees the wound clinic. She had attempted to return to work, but was unsuccessful, so she had to stop. Her sister joins her today.   Office note sent to her medical supply for medical necessity.   Oncology Nurse Navigator Documentation     01/08/2024   10:45 AM  Oncology Nurse Navigator Flowsheets  Navigator Follow Up Date: 02/07/2024  Navigator Follow Up Reason: Follow-up Appointment  Navigator Location [facility]  Navigator Encounter Type Follow-up Appt  Patient Visit Type MedOnc  Treatment Phase Active Tx  Barriers/Navigation Needs Coordination of Care;Education  Interventions Coordination of Care;Psycho-Social Support  Acuity Level 2-Minimal Needs (1-2 Barriers Identified)  Support Groups/Services Friends and Family  Time Spent with Patient 15

## 2024-01-08 NOTE — Progress Notes (Signed)
 Hematology and Oncology Follow Up Visit  ALANE HANSSEN [id number] 08-07-1976 48 y.o. 01/08/2024   Principle Diagnosis:  Recurrent melanoma-BRAF (+) Severe immune mediated dermatologic toxicity  Current Therapy:   Mekinist /Tafinlar -start on 12/11/2023     Interim History:  Ms. Vidovich is back for follow-up.  I am a say that she does look a whole lot better.  I am happy that her feet are improving although she still has quite a bit of pain.  It is still incredibly difficult for her to get around.  Unfortunate, her mobility limitation cannot be sufficiently resolved by the use of a fitted cane or walker.  It is obvious that the use of a manual wheelchair will significantly improve her ability to participate in activities of daily living and to be able to use a wheelchair on a regular basis at home.  Again, we are dealing with quality of life issues.  Her feet are wrapped.  This is being done by herself under the direction of Dermatology..  She is still on prednisone  I think it 40 mg a day.  She is tolerated the Mekinist /Tafinlar  quite nicely.  The lesion on her right leg is certainly a little bit better and seems to be healing up and drying.    I had ordered a PET scan on her a month ago.  I am not sure if this has even been done yet.  Again I was happy that overall her quality of life seems to be doing better.  Currently, I would have to say that her performance status is probably ECOG 2.   Medications:  Current Outpatient Medications:    albuterol  (VENTOLIN  HFA) 108 (90 Base) MCG/ACT inhaler, Inhale 2 puffs into the lungs every 6 (six) hours as needed for wheezing or shortness of breath. (Patient not taking: Reported on 12/04/2023), Disp: 8 g, Rfl: 2   ARIPiprazole (ABILIFY) 2 MG tablet, Take 2 mg by mouth daily., Disp: , Rfl:    atorvastatin (LIPITOR) 10 MG tablet, Take 10 mg by mouth at bedtime., Disp: , Rfl:    BD PEN NEEDLE MICRO U/F 32G X 6 MM MISC, SMARTSIG:1 pen needle  SUB-Q Every Other Day, Disp: , Rfl:    chlorhexidine  (PERIDEX ) 0.12 % solution, Use as directed 15 mLs in the mouth or throat 4 (four) times daily. (Patient not taking: Reported on 12/04/2023), Disp: 500 mL, Rfl: 0   clobetasol  (TEMOVATE ) 0.05 % external solution, Apply 1 Application topically 2 (two) times daily. Apply to scalp 2 x per day as needed (Patient not taking: Reported on 12/04/2023), Disp: 50 mL, Rfl: 3   Continuous Glucose Sensor (DEXCOM G7 SENSOR) MISC, 1 (ONE) EACH EVERY 10 DAYS, Disp: , Rfl:    dabrafenib mesylate  (TAFINLAR ) 75 MG capsule, Take 2 capsules (150 mg total) by mouth 2 (two) times daily. Take on an empty stomach 1 hour before or 2 hours after meals., Disp: 120 capsule, Rfl: 6   FIASP FLEXTOUCH 100 UNIT/ML FlexTouch Pen, , Disp: , Rfl:    FLUoxetine (PROZAC) 40 MG capsule, Take 40 mg by mouth daily., Disp: , Rfl:    folic acid  (FOLVITE ) 1 MG tablet, Take 1 tablet by mouth daily., Disp: , Rfl:    hydrochlorothiazide (HYDRODIURIL) 25 MG tablet, Take by mouth daily., Disp: , Rfl:    hydrOXYzine  (ATARAX ) 25 MG tablet, Take 1 tablet (25 mg total) by mouth every 4 (four) hours as needed. (Patient not taking: Reported on 12/04/2023), Disp: 60 tablet, Rfl: 5  lisinopril (ZESTRIL) 20 MG tablet, Take 1 tablet by mouth daily., Disp: , Rfl:    metFORMIN  (GLUCOPHAGE ) 1000 MG tablet, Take 1 tablet (1,000 mg total) by mouth 2 (two) times daily with a meal., Disp: 60 tablet, Rfl: 1   methotrexate (RHEUMATREX) 2.5 MG tablet, Take 2.5 mg by mouth once a week. 12/04/2023 Has not started yet. (Patient not taking: Reported on 12/04/2023), Disp: , Rfl:    metoprolol succinate (TOPROL-XL) 25 MG 24 hr tablet, Take by mouth daily., Disp: , Rfl:    montelukast (SINGULAIR) 10 MG tablet, Take 10 mg by mouth daily., Disp: , Rfl:    mupirocin cream (BACTROBAN) 2 %, Apply 1 Application topically 2 (two) times daily. (Patient not taking: Reported on 12/04/2023), Disp: , Rfl:    NOVOLOG FLEXPEN 100 UNIT/ML  FlexPen, Inject into the skin as directed. Sliding scale, Disp: , Rfl:    nystatin cream (MYCOSTATIN), Apply 1 Application topically 2 (two) times daily. (Patient not taking: Reported on 12/04/2023), Disp: , Rfl:    oxyCODONE -acetaminophen  (PERCOCET) 7.5-325 MG tablet, Take 1 tablet by mouth every 8 (eight) hours as needed for severe pain (pain score 7-10)., Disp: 60 tablet, Rfl: 0   OZEMPIC, 2 MG/DOSE, 8 MG/3ML SOPN, Inject 2 mg into the skin once a week., Disp: , Rfl:    potassium chloride  (MICRO-K ) 10 MEQ CR capsule, Take 10 mEq by mouth 2 (two) times daily., Disp: , Rfl:    predniSONE  (DELTASONE ) 10 MG tablet, Take 40 mg by mouth daily with breakfast., Disp: , Rfl:    tacrolimus (PROGRAF) 1 MG capsule, 12/04/2023 States is not taken d/t pharmacy not filling. (Patient not taking: Reported on 12/04/2023), Disp: , Rfl:    trametinib  dimethyl sulfoxide  (MEKINIST ) 2 MG tablet, Take 1 tablet (2 mg total) by mouth daily. Take 1 hour before or 2 hours after a meal., Disp: 30 tablet, Rfl: 6   TRESIBA FLEXTOUCH 100 UNIT/ML FlexTouch Pen, Inject 10 Units into the skin at bedtime., Disp: , Rfl:  No current facility-administered medications for this visit.  Facility-Administered Medications Ordered in Other Visits:    heparin  lock flush 100 unit/mL, 500 Units, Intravenous, Once, Lerone Onder, Sherryll Donald, MD   sodium chloride  flush (NS) 0.9 % injection 10 mL, 10 mL, Intravenous, Once, Brinae Woods, Sherryll Donald, MD  Allergies:  Allergies  Allergen Reactions   Pembrolizumab  Other (See Comments)    Blisters which became open wounds    Past Medical History, Surgical history, Social history, and Family History were reviewed and updated.  Review of Systems: Review of Systems  Constitutional:  Positive for fatigue.  HENT:  Negative.    Eyes: Negative.   Respiratory: Negative.    Cardiovascular:  Positive for leg swelling.  Gastrointestinal: Negative.   Endocrine: Negative.   Musculoskeletal:  Positive for flank pain,  gait problem and myalgias.  Skin:  Positive for rash and wound.  Neurological:  Positive for gait problem.  Hematological: Negative.   Psychiatric/Behavioral: Negative.      Physical Exam:  oral temperature is 98.8 F (37.1 C). Her blood pressure is 150/91 (abnormal, pended). Her respiration is 18 and oxygen saturation is 96%.   Wt Readings from Last 3 Encounters:  12/04/23 295 lb (133.8 kg)  11/10/23 (!) 310 lb (140.6 kg)  10/03/23 (!) 305 lb 1.6 oz (138.4 kg)    Physical Exam Vitals reviewed.  HENT:     Head: Normocephalic and atraumatic.     Mouth/Throat:     Comments: She has ulcerations  on her tongue. Eyes:     Pupils: Pupils are equal, round, and reactive to light.  Cardiovascular:     Rate and Rhythm: Normal rate and regular rhythm.     Heart sounds: Normal heart sounds.  Pulmonary:     Effort: Pulmonary effort is normal.     Breath sounds: Normal breath sounds.  Abdominal:     General: Bowel sounds are normal.     Palpations: Abdomen is soft.  Musculoskeletal:        General: No tenderness or deformity. Normal range of motion.     Cervical back: Normal range of motion.  Lymphadenopathy:     Cervical: No cervical adenopathy.  Skin:    General: Skin is warm and dry.     Findings: No erythema or rash.     Comments: Skin exam does show these papular lesions that are healing up.  They are on her extremities.  Neurological:     Mental Status: She is alert and oriented to person, place, and time.  Psychiatric:        Behavior: Behavior normal.        Thought Content: Thought content normal.        Judgment: Judgment normal.     Lab Results  Component Value Date   WBC 13.1 (H) 01/08/2024   HGB 12.7 01/08/2024   HCT 38.6 01/08/2024   MCV 79.8 (L) 01/08/2024   PLT 308 01/08/2024     Chemistry      Component Value Date/Time   NA 141 01/08/2024 1018   K 3.6 01/08/2024 1018   CL 100 01/08/2024 1018   CO2 29 01/08/2024 1018   BUN 30 (H) 01/08/2024 1018    CREATININE 0.73 01/08/2024 1018      Component Value Date/Time   CALCIUM 9.4 01/08/2024 1018   ALKPHOS 68 01/08/2024 1018   AST 9 (L) 01/08/2024 1018   ALT 17 01/08/2024 1018   BILITOT 0.4 01/08/2024 1018      Impression and Plan: Ms. Appleman is a very nice 48 year old white female.  She has a history of recurrent melanoma.  Again, I am still not sure exactly how much recurrence she has.  The biopsy that we did have was of the lesion on her right leg.  Again, she is on the Mekinist /Tafinlar .  She we will continue her on this.  Again she never has had the PET scan done.  I am not sure as to why it has not been done.  I would just plan to wait now until June so that she will be on the Mekinist /Tafinlar  for good 2 months or so.  Hopefully, Dermatology at [facility] we will continue to help with her feet.  They do seem to be getting a little bit better..  I know that the wheelchair will definitely help her quality of life.  I will help her maintain a good activity of daily living schedule and be able to make her more functional.  We will plan to get her back to see us  in another month or so.  [name], MD 4/21/202511:54 AM

## 2024-01-08 NOTE — Patient Instructions (Signed)

## 2024-01-09 ENCOUNTER — Telehealth: Payer: Self-pay | Admitting: Dietician

## 2024-01-09 NOTE — Telephone Encounter (Signed)
 Patient screened on MST also seeing wound specialist. Second attempt to reach. Provided my cell# on voice mail and in text to return call to set up a nutrition consult.  Carleen Chary, RDN, LDN Registered Dietitian, [facility] Part Time Remote (Usual office hours: Tuesday-Thursday) Cell: [phone]

## 2024-01-11 DIAGNOSIS — K121 Other forms of stomatitis: Secondary | ICD-10-CM | POA: Diagnosis not present

## 2024-01-11 DIAGNOSIS — L438 Other lichen planus: Secondary | ICD-10-CM | POA: Diagnosis not present

## 2024-01-11 DIAGNOSIS — Z5181 Encounter for therapeutic drug level monitoring: Secondary | ICD-10-CM | POA: Diagnosis not present

## 2024-01-12 DIAGNOSIS — Z6841 Body Mass Index (BMI) 40.0 and over, adult: Secondary | ICD-10-CM | POA: Diagnosis not present

## 2024-01-12 DIAGNOSIS — E1165 Type 2 diabetes mellitus with hyperglycemia: Secondary | ICD-10-CM | POA: Diagnosis not present

## 2024-01-12 DIAGNOSIS — I10 Essential (primary) hypertension: Secondary | ICD-10-CM | POA: Diagnosis not present

## 2024-01-12 DIAGNOSIS — Z1331 Encounter for screening for depression: Secondary | ICD-10-CM | POA: Diagnosis not present

## 2024-01-17 DIAGNOSIS — L97422 Non-pressure chronic ulcer of left heel and midfoot with fat layer exposed: Secondary | ICD-10-CM | POA: Diagnosis not present

## 2024-01-17 DIAGNOSIS — E11621 Type 2 diabetes mellitus with foot ulcer: Secondary | ICD-10-CM | POA: Diagnosis not present

## 2024-01-17 DIAGNOSIS — R600 Localized edema: Secondary | ICD-10-CM | POA: Diagnosis not present

## 2024-01-17 DIAGNOSIS — Z7984 Long term (current) use of oral hypoglycemic drugs: Secondary | ICD-10-CM | POA: Diagnosis not present

## 2024-01-17 DIAGNOSIS — Z87891 Personal history of nicotine dependence: Secondary | ICD-10-CM | POA: Diagnosis not present

## 2024-01-17 DIAGNOSIS — Z79631 Long term (current) use of antimetabolite agent: Secondary | ICD-10-CM | POA: Diagnosis not present

## 2024-01-17 DIAGNOSIS — L97412 Non-pressure chronic ulcer of right heel and midfoot with fat layer exposed: Secondary | ICD-10-CM | POA: Diagnosis not present

## 2024-01-17 DIAGNOSIS — L438 Other lichen planus: Secondary | ICD-10-CM | POA: Diagnosis not present

## 2024-01-17 DIAGNOSIS — Z794 Long term (current) use of insulin: Secondary | ICD-10-CM | POA: Diagnosis not present

## 2024-01-17 DIAGNOSIS — Z7952 Long term (current) use of systemic steroids: Secondary | ICD-10-CM | POA: Diagnosis not present

## 2024-01-17 DIAGNOSIS — Z79899 Other long term (current) drug therapy: Secondary | ICD-10-CM | POA: Diagnosis not present

## 2024-01-18 ENCOUNTER — Telehealth: Payer: Self-pay | Admitting: Dietician

## 2024-01-18 ENCOUNTER — Inpatient Hospital Stay: Attending: Oncology | Admitting: Dietician

## 2024-01-18 DIAGNOSIS — R197 Diarrhea, unspecified: Secondary | ICD-10-CM | POA: Insufficient documentation

## 2024-01-18 DIAGNOSIS — Z7952 Long term (current) use of systemic steroids: Secondary | ICD-10-CM | POA: Insufficient documentation

## 2024-01-18 DIAGNOSIS — C4371 Malignant melanoma of right lower limb, including hip: Secondary | ICD-10-CM | POA: Insufficient documentation

## 2024-01-18 NOTE — Telephone Encounter (Signed)
 Attempted to reach patient for a scheduled remote nutrition consult. Provided my cell# on voice mail to return call for her follow up nutrition consult.  She screened for weight loss, in EMR meds reflect Ozempic, so weight loss could be intentional. Third and final attempt to reach patient by telephone. Have left messages with return number. Please consult RD for future needs.    Carleen Chary, RDN, LDN Registered Dietitian, [facility] Part Time Remote (Usual office hours: Tuesday-Thursday) Cell: [phone]

## 2024-01-22 DIAGNOSIS — G4733 Obstructive sleep apnea (adult) (pediatric): Secondary | ICD-10-CM | POA: Diagnosis not present

## 2024-01-28 DIAGNOSIS — G4733 Obstructive sleep apnea (adult) (pediatric): Secondary | ICD-10-CM | POA: Diagnosis not present

## 2024-01-30 ENCOUNTER — Other Ambulatory Visit: Payer: Self-pay | Admitting: Hematology & Oncology

## 2024-01-30 DIAGNOSIS — C4371 Malignant melanoma of right lower limb, including hip: Secondary | ICD-10-CM

## 2024-01-31 DIAGNOSIS — Z87891 Personal history of nicotine dependence: Secondary | ICD-10-CM | POA: Diagnosis not present

## 2024-01-31 DIAGNOSIS — E119 Type 2 diabetes mellitus without complications: Secondary | ICD-10-CM | POA: Diagnosis not present

## 2024-01-31 DIAGNOSIS — R21 Rash and other nonspecific skin eruption: Secondary | ICD-10-CM | POA: Diagnosis not present

## 2024-01-31 DIAGNOSIS — L438 Other lichen planus: Secondary | ICD-10-CM | POA: Diagnosis not present

## 2024-01-31 DIAGNOSIS — Z85820 Personal history of malignant melanoma of skin: Secondary | ICD-10-CM | POA: Diagnosis not present

## 2024-01-31 MED ORDER — OXYCODONE-ACETAMINOPHEN 7.5-325 MG PO TABS
1.0000 | ORAL_TABLET | Freq: Three times a day (TID) | ORAL | 0 refills | Status: DC | PRN
Start: 2024-01-31 — End: 2024-02-26

## 2024-02-07 ENCOUNTER — Encounter: Payer: Self-pay | Admitting: *Deleted

## 2024-02-07 ENCOUNTER — Inpatient Hospital Stay

## 2024-02-07 ENCOUNTER — Inpatient Hospital Stay (HOSPITAL_BASED_OUTPATIENT_CLINIC_OR_DEPARTMENT_OTHER): Admitting: Hematology & Oncology

## 2024-02-07 ENCOUNTER — Encounter: Payer: Self-pay | Admitting: Hematology & Oncology

## 2024-02-07 VITALS — BP 107/67 | HR 101 | Temp 99.2°F | Resp 20 | Ht 65.0 in | Wt 274.4 lb

## 2024-02-07 DIAGNOSIS — C4371 Malignant melanoma of right lower limb, including hip: Secondary | ICD-10-CM

## 2024-02-07 DIAGNOSIS — R197 Diarrhea, unspecified: Secondary | ICD-10-CM | POA: Diagnosis not present

## 2024-02-07 DIAGNOSIS — Z7952 Long term (current) use of systemic steroids: Secondary | ICD-10-CM | POA: Diagnosis not present

## 2024-02-07 DIAGNOSIS — Z95828 Presence of other vascular implants and grafts: Secondary | ICD-10-CM

## 2024-02-07 LAB — CMP (CANCER CENTER ONLY)
ALT: 16 U/L (ref 0–44)
AST: 12 U/L — ABNORMAL LOW (ref 15–41)
Albumin: 4.2 g/dL (ref 3.5–5.0)
Alkaline Phosphatase: 91 U/L (ref 38–126)
Anion gap: 15 (ref 5–15)
BUN: 19 mg/dL (ref 6–20)
CO2: 23 mmol/L (ref 22–32)
Calcium: 9.4 mg/dL (ref 8.9–10.3)
Chloride: 97 mmol/L — ABNORMAL LOW (ref 98–111)
Creatinine: 0.97 mg/dL (ref 0.44–1.00)
GFR, Estimated: >60 mL/min (ref >60)
Glucose, Bld: 321 mg/dL — ABNORMAL HIGH (ref 70–99)
Potassium: 3.3 mmol/L — ABNORMAL LOW (ref 3.5–5.1)
Sodium: 135 mmol/L (ref 135–145)
Total Bilirubin: 0.5 mg/dL (ref 0.0–1.2)
Total Protein: 6.3 g/dL — ABNORMAL LOW (ref 6.5–8.1)

## 2024-02-07 LAB — CBC WITH DIFFERENTIAL (CANCER CENTER ONLY)
Abs Immature Granulocytes: 0.07 10*3/uL (ref 0.00–0.07)
Basophils Absolute: 0.1 10*3/uL (ref 0.0–0.1)
Basophils Relative: 0 %
Eosinophils Absolute: 0 10*3/uL (ref 0.0–0.5)
Eosinophils Relative: 0 %
HCT: 41.2 % (ref 36.0–46.0)
Hemoglobin: 13.7 g/dL (ref 12.0–15.0)
Immature Granulocytes: 0 %
Lymphocytes Relative: 8 %
Lymphs Abs: 1.4 10*3/uL (ref 0.7–4.0)
MCH: 27.1 pg (ref 26.0–34.0)
MCHC: 33.3 g/dL (ref 30.0–36.0)
MCV: 81.6 fL (ref 80.0–100.0)
Monocytes Absolute: 0.3 10*3/uL (ref 0.1–1.0)
Monocytes Relative: 2 %
Neutro Abs: 15.5 10*3/uL — ABNORMAL HIGH (ref 1.7–7.7)
Neutrophils Relative %: 90 %
Platelet Count: 294 10*3/uL (ref 150–400)
RBC: 5.05 MIL/uL (ref 3.87–5.11)
RDW: 16.5 % — ABNORMAL HIGH (ref 11.5–15.5)
WBC Count: 17.3 10*3/uL — ABNORMAL HIGH (ref 4.0–10.5)
nRBC: 0 % (ref 0.0–0.2)

## 2024-02-07 LAB — LACTATE DEHYDROGENASE: LDH: 169 U/L (ref 98–192)

## 2024-02-07 LAB — FERRITIN: Ferritin: 49 ng/mL (ref 11–307)

## 2024-02-07 MED ORDER — SODIUM CHLORIDE 0.9% FLUSH
10.0000 mL | Freq: Once | INTRAVENOUS | Status: AC
  Administered 2024-02-07: 10 mL via INTRAVENOUS

## 2024-02-07 MED ORDER — HEPARIN SOD (PORK) LOCK FLUSH 100 UNIT/ML IV SOLN
500.0000 [IU] | Freq: Once | INTRAVENOUS | Status: AC
Start: 2024-02-07 — End: 2024-02-07
  Administered 2024-02-07: 500 [IU] via INTRAVENOUS

## 2024-02-07 NOTE — Progress Notes (Signed)
 Hematology and Oncology Follow Up Visit  Diana Odom [id number] 03-Mar-1976 48 y.o. 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  She is on prednisone  as a slow taper.  She has been followed at a [facility].  They are really doing a fantastic job with her.  She still has a lot of tongue issues.  Hopefully, this will improve.  She has not been able to get some tacrolimus mouth rinse that could really help this out.  She has lost some weight.  She really does not have a lot of taste for food.  She is also having diarrhea.  She is not taking anything for the diarrhea.  I am sure that the diarrhea is probably from the Mekinist /Tafinlar .  I told her that she can certainly take Imodium.  If Imodium does not help, then we will certainly think about Lomotil.  She has had no cough.  She has had no nausea or vomiting.  This has gotten a lot better.  There is no headache.  She still gets around with a wheelchair.  This is helped her quite a bit.  Overall, I would have said that her performance status is probably ECOG 1.   Medications:  Current Outpatient Medications:    ARIPiprazole (ABILIFY) 2 MG tablet, Take 2 mg by mouth daily., Disp: , Rfl:    atorvastatin (LIPITOR) 10 MG tablet, Take 10 mg by mouth at bedtime., Disp: , Rfl:    BD PEN NEEDLE MICRO U/F 32G X 6 MM MISC, SMARTSIG:1 pen needle SUB-Q Every Other Day, Disp: , Rfl:    Continuous Glucose Sensor (DEXCOM G7 SENSOR) MISC, 1 (ONE) EACH EVERY 10 DAYS, Disp: , Rfl:    dabrafenib mesylate  (TAFINLAR ) 75 MG capsule, Take 2 capsules (150 mg total) by mouth 2 (two) times daily. Take on an empty stomach 1 hour before or 2 hours after meals., Disp:  120 capsule, Rfl: 6   FIASP FLEXTOUCH 100 UNIT/ML FlexTouch Pen, , Disp: , Rfl:    FLUoxetine (PROZAC) 40 MG capsule, Take 40 mg by mouth daily., Disp: , Rfl:    folic acid  (FOLVITE ) 1 MG tablet, Take 1 tablet by mouth daily., Disp: , Rfl:    hydrochlorothiazide (HYDRODIURIL) 25 MG tablet, Take by mouth daily., Disp: , Rfl:    hydrOXYzine  (ATARAX ) 25 MG tablet, Take 1 tablet (25 mg total) by mouth every 4 (four) hours as needed., Disp: 60 tablet, Rfl: 5   lisinopril (ZESTRIL) 20 MG tablet, Take 1 tablet by mouth daily., Disp: , Rfl:    metFORMIN  (GLUCOPHAGE ) 1000 MG tablet, Take 1 tablet (1,000 mg total) by mouth 2 (two) times daily with a meal., Disp: 60 tablet, Rfl: 1   methotrexate (RHEUMATREX) 2.5 MG tablet, Take 2.5 mg by mouth once a week. 12/04/2023 Has not started yet., Disp: , Rfl:    metoprolol succinate (TOPROL-XL) 25 MG 24 hr tablet, Take by mouth daily., Disp: , Rfl:    montelukast (SINGULAIR) 10 MG tablet, Take 10 mg by mouth daily., Disp: , Rfl:    NOVOLOG FLEXPEN 100 UNIT/ML FlexPen, Inject into the skin as directed. Sliding scale, Disp: , Rfl:    oxyCODONE -acetaminophen  (PERCOCET) 7.5-325 MG tablet, Take 1 tablet by mouth every 8 (  eight) hours as needed for severe pain (pain score 7-10)., Disp: 60 tablet, Rfl: 0   OZEMPIC, 2 MG/DOSE, 8 MG/3ML SOPN, Inject 2 mg into the skin once a week., Disp: , Rfl:    predniSONE  (DELTASONE ) 10 MG tablet, Take 40 mg by mouth daily with breakfast., Disp: , Rfl:    trametinib  dimethyl sulfoxide  (MEKINIST ) 2 MG tablet, Take 1 tablet (2 mg total) by mouth daily. Take 1 hour before or 2 hours after a meal., Disp: 30 tablet, Rfl: 6   TRESIBA FLEXTOUCH 100 UNIT/ML FlexTouch Pen, Inject 10 Units into the skin at bedtime., Disp: , Rfl:    albuterol  (VENTOLIN  HFA) 108 (90 Base) MCG/ACT inhaler, Inhale 2 puffs into the lungs every 6 (six) hours as needed for wheezing or shortness of breath. (Patient not taking: Reported on 02/07/2024), Disp: 8 g, Rfl: 2    clobetasol  (TEMOVATE ) 0.05 % external solution, Apply 1 Application topically 2 (two) times daily. Apply to scalp 2 x per day as needed (Patient not taking: Reported on 02/07/2024), Disp: 50 mL, Rfl: 3   nystatin cream (MYCOSTATIN), Apply 1 Application topically 2 (two) times daily. (Patient not taking: Reported on 02/07/2024), Disp: , Rfl:    potassium chloride  (MICRO-K ) 10 MEQ CR capsule, Take 10 mEq by mouth 2 (two) times daily. (Patient not taking: Reported on 02/07/2024), Disp: , Rfl:    tacrolimus (PROGRAF) 1 MG capsule, 12/04/2023 States is not taken d/t pharmacy not filling. (Patient not taking: Reported on 02/07/2024), Disp: , Rfl:   Allergies:  Allergies  Allergen Reactions   Pembrolizumab  Other (See Comments)    Blisters which became open wounds    Past Medical History, Surgical history, Social history, and Family History were reviewed and updated.  Review of Systems: Review of Systems  Constitutional:  Positive for fatigue.  HENT:  Negative.    Eyes: Negative.   Respiratory: Negative.    Cardiovascular:  Positive for leg swelling.  Gastrointestinal: Negative.   Endocrine: Negative.   Musculoskeletal:  Positive for flank pain, gait problem and myalgias.  Skin:  Positive for rash and wound.  Neurological:  Positive for gait problem.  Hematological: Negative.   Psychiatric/Behavioral: Negative.      Physical Exam:  Vital signs show temperature of 99.2.  Pulse 101.  Blood pressure 107/67.  Weight is 274 pounds.  Wt Readings from Last 3 Encounters:  12/04/23 295 lb (133.8 kg)  11/10/23 (!) 310 lb (140.6 kg)  10/03/23 (!) 305 lb 1.6 oz (138.4 kg)    Physical Exam Vitals reviewed.  HENT:     Head: Normocephalic and atraumatic.     Mouth/Throat:     Comments: She has ulcerations on her tongue. Eyes:     Pupils: Pupils are equal, round, and reactive to light.  Cardiovascular:     Rate and Rhythm: Normal rate and regular rhythm.     Heart sounds: Normal heart sounds.   Pulmonary:     Effort: Pulmonary effort is normal.     Breath sounds: Normal breath sounds.  Abdominal:     General: Bowel sounds are normal.     Palpations: Abdomen is soft.  Musculoskeletal:        General: No tenderness or deformity. Normal range of motion.     Cervical back: Normal range of motion.  Lymphadenopathy:     Cervical: No cervical adenopathy.  Skin:    General: Skin is warm and dry.     Findings: No erythema or rash.  Comments: Skin exam does show these papular lesions that are healing up.  They are on her extremities.  Neurological:     Mental Status: She is alert and oriented to person, place, and time.  Psychiatric:        Behavior: Behavior normal.        Thought Content: Thought content normal.        Judgment: Judgment normal.      Lab Results  Component Value Date   WBC 17.3 (H) 02/07/2024   HGB 13.7 02/07/2024   HCT 41.2 02/07/2024   MCV 81.6 02/07/2024   PLT 294 02/07/2024     Chemistry      Component Value Date/Time   NA 135 02/07/2024 1507   K 3.3 (L) 02/07/2024 1507   CL 97 (L) 02/07/2024 1507   CO2 23 02/07/2024 1507   BUN 19 02/07/2024 1507   CREATININE 0.97 02/07/2024 1507      Component Value Date/Time   CALCIUM 9.4 02/07/2024 1507   ALKPHOS 91 02/07/2024 1507   AST 12 (L) 02/07/2024 1507   ALT 16 02/07/2024 1507   BILITOT 0.5 02/07/2024 1507      Impression and Plan: Ms. Enslow is a very nice 48 year old white female.  She has a history of recurrent melanoma.  Again, I am still not sure exactly how much recurrence she has.  The biopsy that we did have was of the lesion on her right leg.  Again, she is on the Mekinist /Tafinlar .  She we will continue her on this.  We really need to get the PET scan.  Hopefully, we will be able to get this in about a month.  She would have been on the Mekinist /Tafinlar  for about 3 months.  I am just happy that this skin toxicity is getting better.  I know this was incredibly difficult on  her.  I would like to see her back in about 6 weeks.  By then, we should hopefully know about the PET scan and we could make adjustments with the Mekinist /Tafinlar .   [name], MD 5/21/20254:15 PM

## 2024-02-07 NOTE — Patient Instructions (Signed)

## 2024-02-08 ENCOUNTER — Ambulatory Visit: Payer: Self-pay | Admitting: Hematology & Oncology

## 2024-02-08 LAB — IRON AND IRON BINDING CAPACITY (CC-WL,HP ONLY)
Iron: 139 ug/dL (ref 28–170)
Saturation Ratios: 37 % — ABNORMAL HIGH (ref 10.4–31.8)
TIBC: 372 ug/dL (ref 250–450)
UIBC: 233 ug/dL (ref 148–442)

## 2024-02-08 NOTE — Telephone Encounter (Signed)
 Advised via MyChart.

## 2024-02-08 NOTE — Telephone Encounter (Signed)
-----   Message from [name] sent at 02/08/2024  1:49 PM EDT ----- Please call her and let her know that the iron level is much better.  Diana Odom

## 2024-02-09 DIAGNOSIS — C4371 Malignant melanoma of right lower limb, including hip: Secondary | ICD-10-CM | POA: Diagnosis not present

## 2024-02-09 NOTE — Progress Notes (Signed)
 Patient will need PET to assess for treatment response. PET was order in March, but denied. Insurance requests that CTs be ordered instead. Will follow this request for auth prior to scheduling.   Oncology Nurse Navigator Documentation     02/07/2024    3:45 PM  Oncology Nurse Navigator Flowsheets  Navigator Follow Up Date: 02/09/2024  Navigator Follow Up Reason: Appointment Review  Navigator Location [facility]  Navigator Encounter Type Follow-up Appt  Patient Visit Type MedOnc  Treatment Phase Active Tx  Barriers/Navigation Needs Coordination of Care;Education  Interventions None Required  Acuity Level 2-Minimal Needs (1-2 Barriers Identified)  Support Groups/Services Friends and Family  Time Spent with Patient 15

## 2024-02-13 ENCOUNTER — Encounter: Payer: Self-pay | Admitting: *Deleted

## 2024-02-13 NOTE — Progress Notes (Signed)
 PET Scan denied by insurance. They are requiring CT scans. Spoke to Dr Maria Shiner and he would like me to order CT C/A/P for patient.   Insurance specialist called for one last appeal attempt and this was authorized. PET scan order and auth information sent to [facility] at [phone].  Oncology Nurse Navigator Documentation     02/13/2024   12:45 PM  Oncology Nurse Navigator Flowsheets  Navigator Follow Up Date: 02/22/2024  Navigator Follow Up Reason: Scan Review  Navigator Location [facility]  Navigator Encounter Type Telephone  Telephone Outgoing Call  Patient Visit Type MedOnc  Treatment Phase Active Tx  Barriers/Navigation Needs Coordination of Care;Education  Interventions Coordination of Care  Acuity Level 2-Minimal Needs (1-2 Barriers Identified)  Education Method Verbal  Support Groups/Services Friends and Family  Time Spent with Patient 30

## 2024-02-14 DIAGNOSIS — Z85820 Personal history of malignant melanoma of skin: Secondary | ICD-10-CM | POA: Diagnosis not present

## 2024-02-14 DIAGNOSIS — R21 Rash and other nonspecific skin eruption: Secondary | ICD-10-CM | POA: Diagnosis not present

## 2024-02-14 DIAGNOSIS — E119 Type 2 diabetes mellitus without complications: Secondary | ICD-10-CM | POA: Diagnosis not present

## 2024-02-14 DIAGNOSIS — Z87891 Personal history of nicotine dependence: Secondary | ICD-10-CM | POA: Diagnosis not present

## 2024-02-14 DIAGNOSIS — L438 Other lichen planus: Secondary | ICD-10-CM | POA: Diagnosis not present

## 2024-02-15 DIAGNOSIS — K121 Other forms of stomatitis: Secondary | ICD-10-CM | POA: Diagnosis not present

## 2024-02-15 DIAGNOSIS — Z5181 Encounter for therapeutic drug level monitoring: Secondary | ICD-10-CM | POA: Diagnosis not present

## 2024-02-15 DIAGNOSIS — L438 Other lichen planus: Secondary | ICD-10-CM | POA: Diagnosis not present

## 2024-02-16 DIAGNOSIS — E1142 Type 2 diabetes mellitus with diabetic polyneuropathy: Secondary | ICD-10-CM | POA: Diagnosis not present

## 2024-02-16 DIAGNOSIS — Z872 Personal history of diseases of the skin and subcutaneous tissue: Secondary | ICD-10-CM | POA: Diagnosis not present

## 2024-02-22 DIAGNOSIS — C4371 Malignant melanoma of right lower limb, including hip: Secondary | ICD-10-CM | POA: Diagnosis not present

## 2024-02-22 DIAGNOSIS — R918 Other nonspecific abnormal finding of lung field: Secondary | ICD-10-CM | POA: Diagnosis not present

## 2024-02-22 DIAGNOSIS — C439 Malignant melanoma of skin, unspecified: Secondary | ICD-10-CM | POA: Diagnosis not present

## 2024-02-22 DIAGNOSIS — G4733 Obstructive sleep apnea (adult) (pediatric): Secondary | ICD-10-CM | POA: Diagnosis not present

## 2024-02-26 ENCOUNTER — Encounter: Payer: Self-pay | Admitting: *Deleted

## 2024-02-26 ENCOUNTER — Other Ambulatory Visit: Payer: Self-pay | Admitting: *Deleted

## 2024-02-26 DIAGNOSIS — C4371 Malignant melanoma of right lower limb, including hip: Secondary | ICD-10-CM

## 2024-02-26 MED ORDER — OXYCODONE-ACETAMINOPHEN 7.5-325 MG PO TABS: 1.0000 | ORAL_TABLET | Freq: Three times a day (TID) | ORAL | 0 refills | Status: DC | PRN

## 2024-03-11 DIAGNOSIS — C4371 Malignant melanoma of right lower limb, including hip: Secondary | ICD-10-CM | POA: Diagnosis not present

## 2024-03-12 ENCOUNTER — Encounter: Payer: Self-pay | Admitting: *Deleted

## 2024-03-12 DIAGNOSIS — L438 Other lichen planus: Secondary | ICD-10-CM | POA: Diagnosis not present

## 2024-03-12 DIAGNOSIS — L129 Pemphigoid, unspecified: Secondary | ICD-10-CM | POA: Diagnosis not present

## 2024-03-12 NOTE — Progress Notes (Signed)
 Reviewed PET which was negative for malignancy.  Oncology Nurse Navigator Documentation     03/12/2024    8:15 AM  Oncology Nurse Navigator Flowsheets  Navigator Follow Up Date: 03/20/2024  Navigator Follow Up Reason: Follow-up Appointment  Navigator Location [facility]  Navigator Encounter Type Scan Review  Patient Visit Type MedOnc  Treatment Phase Active Tx  Barriers/Navigation Needs Coordination of Care;Education  Interventions None Required  Acuity Level 2-Minimal Needs (1-2 Barriers Identified)  Support Groups/Services Friends and Family  Time Spent with Patient 15

## 2024-03-20 ENCOUNTER — Inpatient Hospital Stay: Admitting: Hematology & Oncology

## 2024-03-20 ENCOUNTER — Inpatient Hospital Stay: Attending: Oncology

## 2024-03-20 ENCOUNTER — Inpatient Hospital Stay

## 2024-03-20 DIAGNOSIS — L989 Disorder of the skin and subcutaneous tissue, unspecified: Secondary | ICD-10-CM | POA: Insufficient documentation

## 2024-03-20 DIAGNOSIS — C4371 Malignant melanoma of right lower limb, including hip: Secondary | ICD-10-CM | POA: Insufficient documentation

## 2024-03-20 DIAGNOSIS — T451X1A Poisoning by antineoplastic and immunosuppressive drugs, accidental (unintentional), initial encounter: Secondary | ICD-10-CM | POA: Insufficient documentation

## 2024-03-20 DIAGNOSIS — Z79899 Other long term (current) drug therapy: Secondary | ICD-10-CM | POA: Insufficient documentation

## 2024-03-21 ENCOUNTER — Telehealth: Payer: Self-pay | Admitting: Hematology & Oncology

## 2024-03-21 ENCOUNTER — Encounter: Payer: Self-pay | Admitting: *Deleted

## 2024-03-21 NOTE — Progress Notes (Signed)
 Called patient to follow up after no-show to appointment yesterday. Left message requesting call back.  Patient left message stating she was ill and cancelled appointment. She is needing to reschedule. Message sent to scheduling.   Oncology Nurse Navigator Documentation     03/21/2024   12:30 PM  Oncology Nurse Navigator Flowsheets  Navigator Follow Up Date: 04/02/2024  Navigator Follow Up Reason: Follow-up Appointment  Navigator Location [facility]  Navigator Encounter Type Telephone  Telephone Outgoing Call  Patient Visit Type MedOnc  Treatment Phase Active Tx  Barriers/Navigation Needs Coordination of Care;Education  Interventions Coordination of Care  Acuity Level 2-Minimal Needs (1-2 Barriers Identified)  Support Groups/Services Friends and Family  Time Spent with Patient 15

## 2024-03-21 NOTE — Telephone Encounter (Signed)
 lvm to return call for rescheduling missed appointments from 03/20/24.

## 2024-03-23 DIAGNOSIS — G4733 Obstructive sleep apnea (adult) (pediatric): Secondary | ICD-10-CM | POA: Diagnosis not present

## 2024-03-26 DIAGNOSIS — L438 Other lichen planus: Secondary | ICD-10-CM | POA: Diagnosis not present

## 2024-03-26 DIAGNOSIS — L129 Pemphigoid, unspecified: Secondary | ICD-10-CM | POA: Diagnosis not present

## 2024-04-02 ENCOUNTER — Inpatient Hospital Stay (HOSPITAL_BASED_OUTPATIENT_CLINIC_OR_DEPARTMENT_OTHER): Admitting: Hematology & Oncology

## 2024-04-02 ENCOUNTER — Inpatient Hospital Stay

## 2024-04-02 ENCOUNTER — Encounter: Payer: Self-pay | Admitting: *Deleted

## 2024-04-02 ENCOUNTER — Encounter: Payer: Self-pay | Admitting: Hematology & Oncology

## 2024-04-02 DIAGNOSIS — L989 Disorder of the skin and subcutaneous tissue, unspecified: Secondary | ICD-10-CM | POA: Diagnosis not present

## 2024-04-02 DIAGNOSIS — C4371 Malignant melanoma of right lower limb, including hip: Secondary | ICD-10-CM

## 2024-04-02 DIAGNOSIS — T451X1A Poisoning by antineoplastic and immunosuppressive drugs, accidental (unintentional), initial encounter: Secondary | ICD-10-CM | POA: Diagnosis not present

## 2024-04-02 DIAGNOSIS — Z79899 Other long term (current) drug therapy: Secondary | ICD-10-CM | POA: Diagnosis not present

## 2024-04-02 DIAGNOSIS — Z95828 Presence of other vascular implants and grafts: Secondary | ICD-10-CM

## 2024-04-02 LAB — CBC WITH DIFFERENTIAL (CANCER CENTER ONLY)
Abs Immature Granulocytes: 0.13 K/uL — ABNORMAL HIGH (ref 0.00–0.07)
Basophils Absolute: 0 K/uL (ref 0.0–0.1)
Basophils Relative: 0 %
Eosinophils Absolute: 0.1 K/uL (ref 0.0–0.5)
Eosinophils Relative: 1 %
HCT: 36.5 % (ref 36.0–46.0)
Hemoglobin: 11.9 g/dL — ABNORMAL LOW (ref 12.0–15.0)
Immature Granulocytes: 1 %
Lymphocytes Relative: 26 %
Lymphs Abs: 2.9 K/uL (ref 0.7–4.0)
MCH: 29 pg (ref 26.0–34.0)
MCHC: 32.6 g/dL (ref 30.0–36.0)
MCV: 88.8 fL (ref 80.0–100.0)
Monocytes Absolute: 0.6 K/uL (ref 0.1–1.0)
Monocytes Relative: 6 %
Neutro Abs: 7.2 K/uL (ref 1.7–7.7)
Neutrophils Relative %: 66 %
Platelet Count: 232 K/uL (ref 150–400)
RBC: 4.11 MIL/uL (ref 3.87–5.11)
RDW: 17.2 % — ABNORMAL HIGH (ref 11.5–15.5)
WBC Count: 10.9 K/uL — ABNORMAL HIGH (ref 4.0–10.5)
nRBC: 0 % (ref 0.0–0.2)

## 2024-04-02 LAB — CMP (CANCER CENTER ONLY)
ALT: 13 U/L (ref 0–44)
AST: 9 U/L — ABNORMAL LOW (ref 15–41)
Albumin: 3.8 g/dL (ref 3.5–5.0)
Alkaline Phosphatase: 78 U/L (ref 38–126)
Anion gap: 7 (ref 5–15)
BUN: 15 mg/dL (ref 6–20)
CO2: 29 mmol/L (ref 22–32)
Calcium: 9.1 mg/dL (ref 8.9–10.3)
Chloride: 104 mmol/L (ref 98–111)
Creatinine: 0.81 mg/dL (ref 0.44–1.00)
GFR, Estimated: >60 mL/min (ref >60)
Glucose, Bld: 92 mg/dL (ref 70–99)
Potassium: 3.5 mmol/L (ref 3.5–5.1)
Sodium: 140 mmol/L (ref 135–145)
Total Bilirubin: 0.4 mg/dL (ref 0.0–1.2)
Total Protein: 5.9 g/dL — ABNORMAL LOW (ref 6.5–8.1)

## 2024-04-02 LAB — LACTATE DEHYDROGENASE: LDH: 163 U/L (ref 98–192)

## 2024-04-02 MED ORDER — TRAMETINIB DIMETHYL SULFOXIDE 0.5 MG PO TABS: 1.0000 mg | ORAL_TABLET | Freq: Every day | ORAL | 6 refills | Status: AC

## 2024-04-02 MED ORDER — DABRAFENIB MESYLATE 75 MG PO CAPS: 75.0000 mg | ORAL_CAPSULE | Freq: Two times a day (BID) | ORAL | 6 refills | Status: AC

## 2024-04-02 MED ORDER — OXYCODONE-ACETAMINOPHEN 7.5-325 MG PO TABS: 1.0000 | ORAL_TABLET | Freq: Three times a day (TID) | ORAL | 0 refills | Status: DC | PRN

## 2024-04-02 MED ORDER — SODIUM CHLORIDE 0.9% FLUSH
10.0000 mL | Freq: Once | INTRAVENOUS | Status: AC
  Administered 2024-04-02: 10 mL via INTRAVENOUS

## 2024-04-02 MED ORDER — HEPARIN SOD (PORK) LOCK FLUSH 100 UNIT/ML IV SOLN
500.0000 [IU] | Freq: Once | INTRAVENOUS | Status: AC
  Administered 2024-04-02: 500 [IU] via INTRAVENOUS

## 2024-04-02 NOTE — Progress Notes (Signed)
 Hematology and Oncology Follow Up Visit  Diana Odom [id number] 09-20-1975 48 y.o. 04/02/2024   Principle Diagnosis:  Recurrent melanoma-BRAF (+) Severe immune mediated dermatologic toxicity  Current Therapy:   Mekinist /Tafinlar -start on 12/11/2023     Interim History:  Diana Odom is back for follow-up.  Unfortunately, her tongue is still a problem.  She is being treated at [facility] for this.  Hopefully, when she goes back on Thursday, the dermatologists will be able to help her out a bit more.  She still has a lot of of ulceration on the tongue.  It is hard for her to eat.  She really is on a soft type of diet that is incredibly bland.  Thankfully, she does not have any problems with melanoma.  She does feel quite tired.  I do not know if this is from the melanoma therapy.  I think we probably cut the doses back a little bit.  She has had no problems with bowels or bladder.  She has had no bleeding.  She has had no cough..  She has not lost any weight.  Thankfully, she comes in without a wheelchair now..  She still has not tingling in the toes..  She has a CGM for her diabetic monitoring now..  She has had no fever.  Overall, I would have said that her performance status is probably ECOG 1.     Medications:  Current Outpatient Medications:    albuterol  (VENTOLIN  HFA) 108 (90 Base) MCG/ACT inhaler, Inhale 2 puffs into the lungs every 6 (six) hours as needed for wheezing or shortness of breath., Disp: 8 g, Rfl: 2   ARIPiprazole (ABILIFY) 2 MG tablet, Take 2 mg by mouth daily., Disp: , Rfl:    atorvastatin (LIPITOR) 10 MG tablet, Take 10 mg by mouth at bedtime., Disp: , Rfl:    BD PEN NEEDLE MICRO U/F 32G X 6 MM MISC, SMARTSIG:1 pen needle SUB-Q Every Other Day, Disp: , Rfl:    Continuous Glucose Sensor (DEXCOM G7 SENSOR) MISC, 1 (ONE) EACH EVERY 10 DAYS, Disp: , Rfl:    dabrafenib mesylate  (TAFINLAR ) 75 MG capsule, Take 2 capsules (150 mg total) by mouth 2 (two) times daily.  Take on an empty stomach 1 hour before or 2 hours after meals., Disp: 120 capsule, Rfl: 6   FIASP FLEXTOUCH 100 UNIT/ML FlexTouch Pen, , Disp: , Rfl:    FLUoxetine (PROZAC) 40 MG capsule, Take 40 mg by mouth daily., Disp: , Rfl:    folic acid  (FOLVITE ) 1 MG tablet, Take 1 tablet by mouth daily., Disp: , Rfl:    hydrochlorothiazide (HYDRODIURIL) 25 MG tablet, Take by mouth daily., Disp: , Rfl:    hydrOXYzine  (ATARAX ) 25 MG tablet, Take 1 tablet (25 mg total) by mouth every 4 (four) hours as needed., Disp: 60 tablet, Rfl: 5   lisinopril (ZESTRIL) 20 MG tablet, Take 1 tablet by mouth daily., Disp: , Rfl:    metFORMIN  (GLUCOPHAGE ) 1000 MG tablet, Take 1 tablet (1,000 mg total) by mouth 2 (two) times daily with a meal., Disp: 60 tablet, Rfl: 1   methotrexate (RHEUMATREX) 2.5 MG tablet, Take 2.5 mg by mouth once a week. 12/04/2023 Has not started yet. (Patient taking differently: Take 15 mg by mouth once a week. 12/04/2023 Has not started yet.), Disp: , Rfl:    metoprolol succinate (TOPROL-XL) 25 MG 24 hr tablet, Take by mouth daily., Disp: , Rfl:    montelukast (SINGULAIR) 10 MG tablet, Take 10 mg by mouth daily.,  Disp: , Rfl:    NOVOLOG FLEXPEN 100 UNIT/ML FlexPen, Inject into the skin as directed. Sliding scale, Disp: , Rfl:    oxyCODONE -acetaminophen  (PERCOCET) 7.5-325 MG tablet, Take 1 tablet by mouth every 8 (eight) hours as needed for severe pain (pain score 7-10)., Disp: 60 tablet, Rfl: 0   OZEMPIC, 2 MG/DOSE, 8 MG/3ML SOPN, Inject 2 mg into the skin once a week., Disp: , Rfl:    potassium chloride  (MICRO-K ) 10 MEQ CR capsule, Take 10 mEq by mouth 2 (two) times daily., Disp: , Rfl:    predniSONE  (DELTASONE ) 10 MG tablet, Take 40 mg by mouth daily with breakfast., Disp: , Rfl:    riTUXimab (RITUXAN IV), Inject 1,000 mg into the vein every 14 (fourteen) days., Disp: , Rfl:    trametinib  dimethyl sulfoxide  (MEKINIST ) 2 MG tablet, Take 1 tablet (2 mg total) by mouth daily. Take 1 hour before or 2 hours  after a meal., Disp: 30 tablet, Rfl: 6   TRESIBA FLEXTOUCH 100 UNIT/ML FlexTouch Pen, Inject 10 Units into the skin at bedtime., Disp: , Rfl:    clobetasol  (TEMOVATE ) 0.05 % external solution, Apply 1 Application topically 2 (two) times daily. Apply to scalp 2 x per day as needed (Patient not taking: Reported on 04/02/2024), Disp: 50 mL, Rfl: 3   nystatin cream (MYCOSTATIN), Apply 1 Application topically 2 (two) times daily. (Patient not taking: Reported on 04/02/2024), Disp: , Rfl:    tacrolimus (PROGRAF) 1 MG capsule, 12/04/2023 States is not taken d/t pharmacy not filling. (Patient not taking: Reported on 04/02/2024), Disp: , Rfl:   Allergies:  Allergies  Allergen Reactions   Pembrolizumab  Other (See Comments)    Blisters which became open wounds    Past Medical History, Surgical history, Social history, and Family History were reviewed and updated.  Review of Systems: Review of Systems  Constitutional:  Positive for fatigue.  HENT:  Negative.    Eyes: Negative.   Respiratory: Negative.    Cardiovascular:  Positive for leg swelling.  Gastrointestinal: Negative.   Endocrine: Negative.   Musculoskeletal:  Positive for flank pain, gait problem and myalgias.  Skin:  Positive for rash and wound.  Neurological:  Positive for gait problem.  Hematological: Negative.   Psychiatric/Behavioral: Negative.      Physical Exam:  Vital signs show temperature of 98.7.  Pulse 92.  Blood pressure 119/78.  Weight is 287 pounds.   Wt Readings from Last 3 Encounters:  04/02/24 287 lb (130.2 kg)  02/07/24 274 lb 6.4 oz (124.5 kg)  12/04/23 295 lb (133.8 kg)    Physical Exam Vitals reviewed.  HENT:     Head: Normocephalic and atraumatic.     Mouth/Throat:     Comments: She has ulcerations on her tongue. Eyes:     Pupils: Pupils are equal, round, and reactive to light.  Cardiovascular:     Rate and Rhythm: Normal rate and regular rhythm.     Heart sounds: Normal heart sounds.  Pulmonary:      Effort: Pulmonary effort is normal.     Breath sounds: Normal breath sounds.  Abdominal:     General: Bowel sounds are normal.     Palpations: Abdomen is soft.  Musculoskeletal:        General: No tenderness or deformity. Normal range of motion.     Cervical back: Normal range of motion.  Lymphadenopathy:     Cervical: No cervical adenopathy.  Skin:    General: Skin is warm and dry.  Findings: No erythema or rash.     Comments: Skin exam does show these papular lesions that are healing up.  They are on her extremities.  Neurological:     Mental Status: She is alert and oriented to person, place, and time.  Psychiatric:        Behavior: Behavior normal.        Thought Content: Thought content normal.        Judgment: Judgment normal.      Lab Results  Component Value Date   WBC 10.9 (H) 04/02/2024   HGB 11.9 (L) 04/02/2024   HCT 36.5 04/02/2024   MCV 88.8 04/02/2024   PLT 232 04/02/2024     Chemistry      Component Value Date/Time   NA 140 04/02/2024 1515   K 3.5 04/02/2024 1515   CL 104 04/02/2024 1515   CO2 29 04/02/2024 1515   BUN 15 04/02/2024 1515   CREATININE 0.81 04/02/2024 1515      Component Value Date/Time   CALCIUM 9.1 04/02/2024 1515   ALKPHOS 78 04/02/2024 1515   AST 9 (L) 04/02/2024 1515   ALT 13 04/02/2024 1515   BILITOT 0.4 04/02/2024 1515      Impression and Plan: Ms. Teschner is a very nice 48 year old white female.  She has a history of recurrent melanoma.  We have her now on targeted therapy.  This is clearly working.  There was nothing on the PET scan that look like recurrent melanoma.  Her main problem continues to be the toxicity she had from the Keytruda .  She has had horrible skin toxicity, including the tongue which is her biggest problem.  She has been seen at [facility] for this.  I am going to cut the dose of Mekinist  and Tafinlar  down.  We will see if this may help her feel little bit better..  She is on a lot of medications.   It is possible that the medications could be making her just feel tired.  I would like to get her back to see us  in another 6 weeks or so.  I do not think we have to do another PET scan on her for quite a while now.   Maude JONELLE Crease, MD 7/15/20254:01 PM

## 2024-04-02 NOTE — Progress Notes (Unsigned)
 Patient comes in for PET review. This was negative for any melanoma recurrence. She is tolerating her treatment well, however she is still struggling with side effects related to her immunotherapy. She is managed at [facility] for these. Dr Ennever will dose reduce her melanoma treatment to see if this helps with recovery.   Oncology Nurse Navigator Documentation     04/02/2024    3:30 PM  Oncology Nurse Navigator Flowsheets  Navigator Follow Up Date: 05/16/2024  Navigator Follow Up Reason: Follow-up Appointment  Navigator Location [facility]  Navigator Encounter Type Follow-up Appt  Patient Visit Type MedOnc  Treatment Phase Active Tx  Barriers/Navigation Needs No Barriers At This Time  Interventions None Required  Acuity Level 1-No Barriers  Support Groups/Services Friends and Family  Time Spent with Patient 15

## 2024-04-02 NOTE — Patient Instructions (Signed)

## 2024-04-04 DIAGNOSIS — Z5181 Encounter for therapeutic drug level monitoring: Secondary | ICD-10-CM | POA: Diagnosis not present

## 2024-04-04 DIAGNOSIS — L438 Other lichen planus: Secondary | ICD-10-CM | POA: Diagnosis not present

## 2024-04-04 DIAGNOSIS — L129 Pemphigoid, unspecified: Secondary | ICD-10-CM | POA: Diagnosis not present

## 2024-04-10 DIAGNOSIS — C4371 Malignant melanoma of right lower limb, including hip: Secondary | ICD-10-CM | POA: Diagnosis not present

## 2024-04-18 DIAGNOSIS — E1169 Type 2 diabetes mellitus with other specified complication: Secondary | ICD-10-CM | POA: Diagnosis not present

## 2024-04-18 DIAGNOSIS — I10 Essential (primary) hypertension: Secondary | ICD-10-CM | POA: Diagnosis not present

## 2024-04-18 DIAGNOSIS — E66813 Obesity, class 3: Secondary | ICD-10-CM | POA: Diagnosis not present

## 2024-04-23 ENCOUNTER — Other Ambulatory Visit (HOSPITAL_COMMUNITY): Payer: Self-pay

## 2024-04-23 ENCOUNTER — Telehealth: Payer: Self-pay

## 2024-04-23 DIAGNOSIS — G4733 Obstructive sleep apnea (adult) (pediatric): Secondary | ICD-10-CM | POA: Diagnosis not present

## 2024-04-23 NOTE — Telephone Encounter (Signed)
 Oral Oncology Patient Advocate Encounter  Prior Authorization Renewal for Tafinlar  has been approved.    PA# [id number] Effective dates: 12/05/23 through 12/04/24  Patient must fill at [facility]    Charlott Hamilton,  CPhT-Adv  she/her/hers [place]  [facility] Pharmacy Technician Patient Advocate Specialist III [facility] Phone: [phone]  Fax: [phone] [email or web address]

## 2024-04-23 NOTE — Telephone Encounter (Signed)
 Oral Oncology Patient Advocate Encounter   Received notification that prior authorization for Tafinlar  is due for renewal.   PA submitted on 04/23/24 Key [id number] Status is pending      Charlott Hamilton,  CPhT-Adv  she/her/hers [facility]  [facility] Pharmacy Technician Patient Advocate Specialist III [facility] Phone: [phone]  Fax: [phone] [email or web address]

## 2024-04-25 ENCOUNTER — Other Ambulatory Visit: Payer: Self-pay | Admitting: *Deleted

## 2024-04-25 ENCOUNTER — Encounter: Payer: Self-pay | Admitting: *Deleted

## 2024-04-25 DIAGNOSIS — C4371 Malignant melanoma of right lower limb, including hip: Secondary | ICD-10-CM

## 2024-04-25 MED ORDER — OXYCODONE-ACETAMINOPHEN 7.5-325 MG PO TABS
1.0000 | ORAL_TABLET | Freq: Three times a day (TID) | ORAL | 0 refills | Status: DC | PRN
Start: 2024-04-25 — End: 2024-05-29

## 2024-05-11 DIAGNOSIS — C4371 Malignant melanoma of right lower limb, including hip: Secondary | ICD-10-CM | POA: Diagnosis not present

## 2024-05-16 ENCOUNTER — Inpatient Hospital Stay

## 2024-05-16 ENCOUNTER — Inpatient Hospital Stay: Admitting: Hematology & Oncology

## 2024-05-27 ENCOUNTER — Inpatient Hospital Stay: Attending: Oncology

## 2024-05-27 ENCOUNTER — Encounter: Payer: Self-pay | Admitting: Hematology & Oncology

## 2024-05-27 ENCOUNTER — Encounter: Payer: Self-pay | Admitting: *Deleted

## 2024-05-27 ENCOUNTER — Inpatient Hospital Stay (HOSPITAL_BASED_OUTPATIENT_CLINIC_OR_DEPARTMENT_OTHER): Admitting: Hematology & Oncology

## 2024-05-27 ENCOUNTER — Inpatient Hospital Stay

## 2024-05-27 VITALS — BP 104/58 | HR 85 | Temp 98.6°F | Resp 18 | Ht 65.0 in | Wt 278.3 lb

## 2024-05-27 DIAGNOSIS — Z08 Encounter for follow-up examination after completed treatment for malignant neoplasm: Secondary | ICD-10-CM | POA: Diagnosis not present

## 2024-05-27 DIAGNOSIS — B379 Candidiasis, unspecified: Secondary | ICD-10-CM | POA: Diagnosis not present

## 2024-05-27 DIAGNOSIS — R197 Diarrhea, unspecified: Secondary | ICD-10-CM | POA: Insufficient documentation

## 2024-05-27 DIAGNOSIS — C4371 Malignant melanoma of right lower limb, including hip: Secondary | ICD-10-CM | POA: Insufficient documentation

## 2024-05-27 DIAGNOSIS — Z7952 Long term (current) use of systemic steroids: Secondary | ICD-10-CM | POA: Diagnosis not present

## 2024-05-27 DIAGNOSIS — L270 Generalized skin eruption due to drugs and medicaments taken internally: Secondary | ICD-10-CM | POA: Diagnosis not present

## 2024-05-27 LAB — CMP (CANCER CENTER ONLY)
ALT: 30 U/L (ref 0–44)
AST: 16 U/L (ref 15–41)
Albumin: 4.1 g/dL (ref 3.5–5.0)
Alkaline Phosphatase: 72 U/L (ref 38–126)
Anion gap: 12 (ref 5–15)
BUN: 25 mg/dL — ABNORMAL HIGH (ref 6–20)
CO2: 27 mmol/L (ref 22–32)
Calcium: 10.1 mg/dL (ref 8.9–10.3)
Chloride: 100 mmol/L (ref 98–111)
Creatinine: 0.87 mg/dL (ref 0.44–1.00)
GFR, Estimated: >60 mL/min (ref >60)
Glucose, Bld: 93 mg/dL (ref 70–99)
Potassium: 3.6 mmol/L (ref 3.5–5.1)
Sodium: 139 mmol/L (ref 135–145)
Total Bilirubin: 0.4 mg/dL (ref 0.0–1.2)
Total Protein: 6.1 g/dL — ABNORMAL LOW (ref 6.5–8.1)

## 2024-05-27 LAB — CBC WITH DIFFERENTIAL (CANCER CENTER ONLY)
Abs Immature Granulocytes: 0.06 K/uL (ref 0.00–0.07)
Basophils Absolute: 0.1 K/uL (ref 0.0–0.1)
Basophils Relative: 1 %
Eosinophils Absolute: 0.1 K/uL (ref 0.0–0.5)
Eosinophils Relative: 1 %
HCT: 38.6 % (ref 36.0–46.0)
Hemoglobin: 13.1 g/dL (ref 12.0–15.0)
Immature Granulocytes: 1 %
Lymphocytes Relative: 25 %
Lymphs Abs: 2.6 K/uL (ref 0.7–4.0)
MCH: 31.4 pg (ref 26.0–34.0)
MCHC: 33.9 g/dL (ref 30.0–36.0)
MCV: 92.6 fL (ref 80.0–100.0)
Monocytes Absolute: 0.7 K/uL (ref 0.1–1.0)
Monocytes Relative: 7 %
Neutro Abs: 7.1 K/uL (ref 1.7–7.7)
Neutrophils Relative %: 65 %
Platelet Count: 256 K/uL (ref 150–400)
RBC: 4.17 MIL/uL (ref 3.87–5.11)
RDW: 15.4 % (ref 11.5–15.5)
WBC Count: 10.7 K/uL — ABNORMAL HIGH (ref 4.0–10.5)
nRBC: 0 % (ref 0.0–0.2)

## 2024-05-27 LAB — LACTATE DEHYDROGENASE: LDH: 206 U/L — ABNORMAL HIGH (ref 98–192)

## 2024-05-27 MED ORDER — DIPHENOXYLATE-ATROPINE 2.5-0.025 MG PO TABS: 2.0000 | ORAL_TABLET | Freq: Four times a day (QID) | ORAL | 0 refills | Status: AC | PRN

## 2024-05-27 MED ORDER — FLUCONAZOLE 100 MG PO TABS: 100.0000 mg | ORAL_TABLET | Freq: Every day | ORAL | 4 refills | Status: AC

## 2024-05-27 NOTE — Progress Notes (Unsigned)
 Patient comes in for follow up. She looks incredibly well. Her skin has had significant improvement. She is walking today, where in the past she came up in a wheelchair. She states she is still out of work, but that QOL is greatly improved.   Oncology Nurse Navigator Documentation     05/27/2024    9:45 AM  Oncology Nurse Navigator Flowsheets  Navigator Follow Up Date: 07/01/2024  Navigator Follow Up Reason: Follow-up Appointment  Navigator Location [facility]  Navigator Encounter Type Treatment  Patient Visit Type MedOnc  Treatment Phase Active Tx  Barriers/Navigation Needs No Barriers At This Time  Interventions Psycho-Social Support  Acuity Level 1-No Barriers  Support Groups/Services Friends and Family  Time Spent with Patient 15

## 2024-05-27 NOTE — Progress Notes (Signed)
 Hematology and Oncology Follow Up Visit  Diana Odom [id number] 13-Jun-1976 48 y.o. 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 is back for follow-up.  Unfortunately, her tongue is still a problem.  She is being treated at [facility] for this.  She goes to [facility] on Friday.  She is on a lot of steroids.  Her face really showing effect of the steroids..  She has little bit of thrush in the mouth.  I will put her on some Diflucan  to see if this may help.  She also has diarrhea.  Imodium is not helping.  I will try her on some Lomotil .  I told her to stop the Mekinist /Tafinlar  for a week.  This may help with the diarrhea.  At least, she was walking in here and.  This is a marked improvement from when I first saw her.  She has had no fever.  She did have COVID a couple weeks ago.  She did not require any therapy for this.  She has had no bleeding.  There is been no headache.  She has had no rashes.  Overall, I would have to say that her performance status is probably ECOG 1.  Medications:  Current Outpatient Medications:    albuterol  (VENTOLIN  HFA) 108 (90 Base) MCG/ACT inhaler, Inhale 2 puffs into the lungs every 6 (six) hours as needed for wheezing or shortness of breath., Disp: 8 g, Rfl: 2   ARIPiprazole (ABILIFY) 2 MG tablet, Take 2 mg by mouth daily., Disp: , Rfl:    atorvastatin (LIPITOR) 10 MG tablet, Take 10 mg by mouth at bedtime., Disp: , Rfl:    BD PEN NEEDLE MICRO U/F 32G X 6 MM MISC, SMARTSIG:1 pen needle SUB-Q Every Other Day, Disp: , Rfl:    Continuous Glucose Sensor (DEXCOM G7 SENSOR) MISC, 1 (ONE) EACH EVERY 10 DAYS, Disp: , Rfl:    dabrafenib mesylate  (TAFINLAR ) 75 MG capsule, Take 1 capsule (75 mg total) by mouth 2 (two) times daily. Take on an empty stomach 1 hour before or 2 hours after meals., Disp: 120  capsule, Rfl: 6   FIASP FLEXTOUCH 100 UNIT/ML FlexTouch Pen, , Disp: , Rfl:    FLUoxetine (PROZAC) 40 MG capsule, Take 40 mg by mouth daily., Disp: , Rfl:    folic acid  (FOLVITE ) 1 MG tablet, Take 1 tablet by mouth daily., Disp: , Rfl:    hydrochlorothiazide (HYDRODIURIL) 25 MG tablet, Take by mouth daily., Disp: , Rfl:    hydrOXYzine  (ATARAX ) 25 MG tablet, Take 1 tablet (25 mg total) by mouth every 4 (four) hours as needed., Disp: 60 tablet, Rfl: 5   lisinopril (ZESTRIL) 20 MG tablet, Take 1 tablet by mouth daily., Disp: , Rfl:    metFORMIN  (GLUCOPHAGE ) 1000 MG tablet, Take 1 tablet (1,000 mg total) by mouth 2 (two) times daily with a meal., Disp: 60 tablet, Rfl: 1   methotrexate (RHEUMATREX) 2.5 MG tablet, Take 2.5 mg by mouth once a week. 12/04/2023 Has not started yet. (Patient taking differently: Take 10 mg by mouth once a week. 12/04/2023 Has not started yet.), Disp: , Rfl:    metoprolol succinate (TOPROL-XL) 25 MG 24 hr tablet, Take by mouth daily., Disp: , Rfl:    montelukast (SINGULAIR) 10 MG tablet, Take 10 mg by mouth daily., Disp: , Rfl:    NOVOLOG FLEXPEN 100 UNIT/ML FlexPen, Inject  into the skin as directed. Sliding scale, Disp: , Rfl:    oxyCODONE -acetaminophen  (PERCOCET) 7.5-325 MG tablet, Take 1 tablet by mouth every 8 (eight) hours as needed for severe pain (pain score 7-10)., Disp: 60 tablet, Rfl: 0   OZEMPIC, 2 MG/DOSE, 8 MG/3ML SOPN, Inject 2 mg into the skin once a week., Disp: , Rfl:    potassium chloride  (MICRO-K ) 10 MEQ CR capsule, Take 10 mEq by mouth 2 (two) times daily. (Patient taking differently: Take 10 mEq by mouth 2 (two) times daily as needed.), Disp: , Rfl:    predniSONE  (DELTASONE ) 10 MG tablet, Take 40 mg by mouth daily with breakfast., Disp: , Rfl:    trametinib  dimethyl sulfoxide  (MEKINIST ) 0.5 MG tablet, Take 2 tablets (1 mg total) by mouth daily. Take 1 hour before or 2 hours after a meal., Disp: 60 tablet, Rfl: 6   TRESIBA FLEXTOUCH 100 UNIT/ML FlexTouch Pen,  Inject 10 Units into the skin at bedtime., Disp: , Rfl:    nystatin cream (MYCOSTATIN), Apply 1 Application topically 2 (two) times daily. (Patient not taking: Reported on 05/27/2024), Disp: , Rfl:    riTUXimab  (RITUXAN IV), Inject 1,000 mg into the vein every 14 (fourteen) days. (Patient not taking: Reported on 05/27/2024), Disp: , Rfl:    tacrolimus (PROGRAF) 1 MG capsule, 12/04/2023 States is not taken d/t pharmacy not filling. (Patient not taking: Reported on 05/27/2024), Disp: , Rfl:   Allergies:  Allergies  Allergen Reactions   Pembrolizumab  Other (See Comments)    Blisters which became open wounds    Past Medical History, Surgical history, Social history, and Family History were reviewed and updated.  Review of Systems: Review of Systems  Constitutional:  Positive for fatigue.  HENT:  Negative.    Eyes: Negative.   Respiratory: Negative.    Cardiovascular:  Positive for leg swelling.  Gastrointestinal: Negative.   Endocrine: Negative.   Musculoskeletal:  Positive for flank pain, gait problem and myalgias.  Skin:  Positive for rash and wound.  Neurological:  Positive for gait problem.  Hematological: Negative.   Psychiatric/Behavioral: Negative.      Physical Exam:  Vital signs show temperature of 98.6.  Pulse 85.  Blood pressure 104/58.  Weight is 278 pounds.     Wt Readings from Last 3 Encounters:  05/27/24 278 lb 4.8 oz (126.2 kg)  04/02/24 287 lb (130.2 kg)  02/07/24 274 lb 6.4 oz (124.5 kg)    Physical Exam Vitals reviewed.  HENT:     Head: Normocephalic and atraumatic.     Mouth/Throat:     Comments: She has ulcerations on her tongue. Eyes:     Pupils: Pupils are equal, round, and reactive to light.  Cardiovascular:     Rate and Rhythm: Normal rate and regular rhythm.     Heart sounds: Normal heart sounds.  Pulmonary:     Effort: Pulmonary effort is normal.     Breath sounds: Normal breath sounds.  Abdominal:     General: Bowel sounds are normal.      Palpations: Abdomen is soft.  Musculoskeletal:        General: No tenderness or deformity. Normal range of motion.     Cervical back: Normal range of motion.  Lymphadenopathy:     Cervical: No cervical adenopathy.  Skin:    General: Skin is warm and dry.     Findings: No erythema or rash.     Comments: Skin exam does show these papular lesions that are healing up.  They are on her extremities.  Neurological:     Mental Status: She is alert and oriented to person, place, and time.  Psychiatric:        Behavior: Behavior normal.        Thought Content: Thought content normal.        Judgment: Judgment normal.      Lab Results  Component Value Date   WBC 10.7 (H) 05/27/2024   HGB 13.1 05/27/2024   HCT 38.6 05/27/2024   MCV 92.6 05/27/2024   PLT 256 05/27/2024     Chemistry      Component Value Date/Time   NA 139 05/27/2024 0951   K 3.6 05/27/2024 0951   CL 100 05/27/2024 0951   CO2 27 05/27/2024 0951   BUN 25 (H) 05/27/2024 0951   CREATININE 0.87 05/27/2024 0951      Component Value Date/Time   CALCIUM 10.1 05/27/2024 0951   ALKPHOS 72 05/27/2024 0951   AST 16 05/27/2024 0951   ALT 30 05/27/2024 0951   BILITOT 0.4 05/27/2024 0951      Impression and Plan: Ms. Esters is a very nice 48 year old white female.  She has a history of recurrent melanoma.  We have her now on targeted therapy.  This is clearly working.  There was nothing on the PET scan that look like recurrent melanoma.  Her main problem continues to be the toxicity she had from the Keytruda .  She has had horrible skin toxicity, including the tongue which is her biggest problem.  She has been seen at [facility] for this.  Again, we are going to hold the Mekinist /Tafinlar  for a week.  I have already cut the dose back a little bit.  If the diarrhea does not improve, then we may have to just hold on her treatment.  I do not see that we have to do any scans on her right now.  Have all have her come back to  see me in another 6 weeks.  Maude JONELLE Crease, MD 9/8/202511:04 AM

## 2024-05-27 NOTE — Patient Instructions (Signed)

## 2024-05-28 ENCOUNTER — Encounter: Payer: Self-pay | Admitting: *Deleted

## 2024-05-29 ENCOUNTER — Other Ambulatory Visit: Payer: Self-pay | Admitting: *Deleted

## 2024-05-29 DIAGNOSIS — C4371 Malignant melanoma of right lower limb, including hip: Secondary | ICD-10-CM

## 2024-05-29 MED ORDER — OXYCODONE-ACETAMINOPHEN 7.5-325 MG PO TABS: 1.0000 | ORAL_TABLET | Freq: Three times a day (TID) | ORAL | 0 refills | Status: DC | PRN

## 2024-06-27 ENCOUNTER — Other Ambulatory Visit: Payer: Self-pay | Admitting: *Deleted

## 2024-06-27 ENCOUNTER — Encounter: Payer: Self-pay | Admitting: *Deleted

## 2024-06-27 DIAGNOSIS — C4371 Malignant melanoma of right lower limb, including hip: Secondary | ICD-10-CM

## 2024-06-27 MED ORDER — OXYCODONE-ACETAMINOPHEN 7.5-325 MG PO TABS: 1.0000 | ORAL_TABLET | Freq: Three times a day (TID) | ORAL | 0 refills | Status: DC | PRN

## 2024-07-01 ENCOUNTER — Inpatient Hospital Stay: Attending: Oncology

## 2024-07-01 ENCOUNTER — Ambulatory Visit (HOSPITAL_BASED_OUTPATIENT_CLINIC_OR_DEPARTMENT_OTHER)
Admission: RE | Admit: 2024-07-01 | Discharge: 2024-07-01 | Disposition: A | Discharge status: Home / Self Care | Source: Ambulatory Visit | Attending: Hematology & Oncology | Admitting: Hematology & Oncology

## 2024-07-01 ENCOUNTER — Inpatient Hospital Stay

## 2024-07-01 ENCOUNTER — Ambulatory Visit: Payer: Self-pay | Admitting: Hematology & Oncology

## 2024-07-01 ENCOUNTER — Inpatient Hospital Stay (HOSPITAL_BASED_OUTPATIENT_CLINIC_OR_DEPARTMENT_OTHER): Admitting: Hematology & Oncology

## 2024-07-01 ENCOUNTER — Other Ambulatory Visit: Payer: Self-pay

## 2024-07-01 ENCOUNTER — Encounter: Payer: Self-pay | Admitting: Hematology & Oncology

## 2024-07-01 VITALS — BP 118/77 | HR 71 | Temp 98.3°F | Resp 18 | Ht 65.0 in | Wt 279.0 lb

## 2024-07-01 DIAGNOSIS — L270 Generalized skin eruption due to drugs and medicaments taken internally: Secondary | ICD-10-CM

## 2024-07-01 DIAGNOSIS — C4371 Malignant melanoma of right lower limb, including hip: Secondary | ICD-10-CM | POA: Insufficient documentation

## 2024-07-01 DIAGNOSIS — Z08 Encounter for follow-up examination after completed treatment for malignant neoplasm: Secondary | ICD-10-CM

## 2024-07-01 DIAGNOSIS — R197 Diarrhea, unspecified: Secondary | ICD-10-CM | POA: Insufficient documentation

## 2024-07-01 DIAGNOSIS — J45909 Unspecified asthma, uncomplicated: Secondary | ICD-10-CM | POA: Insufficient documentation

## 2024-07-01 LAB — CMP (CANCER CENTER ONLY)
ALT: 19 U/L (ref 0–44)
AST: 14 U/L — ABNORMAL LOW (ref 15–41)
Albumin: 4 g/dL (ref 3.5–5.0)
Alkaline Phosphatase: 68 U/L (ref 38–126)
Anion gap: 13 (ref 5–15)
BUN: 19 mg/dL (ref 6–20)
CO2: 26 mmol/L (ref 22–32)
Calcium: 9.4 mg/dL (ref 8.9–10.3)
Chloride: 101 mmol/L (ref 98–111)
Creatinine: 0.83 mg/dL (ref 0.44–1.00)
GFR, Estimated: >60 mL/min (ref >60)
Glucose, Bld: 104 mg/dL — ABNORMAL HIGH (ref 70–99)
Potassium: 3.4 mmol/L — ABNORMAL LOW (ref 3.5–5.1)
Sodium: 140 mmol/L (ref 135–145)
Total Bilirubin: 0.5 mg/dL (ref 0.0–1.2)
Total Protein: 6.1 g/dL — ABNORMAL LOW (ref 6.5–8.1)

## 2024-07-01 LAB — CBC WITH DIFFERENTIAL (CANCER CENTER ONLY)
Abs Immature Granulocytes: 0.19 K/uL — ABNORMAL HIGH (ref 0.00–0.07)
Basophils Absolute: 0.1 K/uL (ref 0.0–0.1)
Basophils Relative: 1 %
Eosinophils Absolute: 0.1 K/uL (ref 0.0–0.5)
Eosinophils Relative: 1 %
HCT: 37.9 % (ref 36.0–46.0)
Hemoglobin: 13.1 g/dL (ref 12.0–15.0)
Immature Granulocytes: 1 %
Lymphocytes Relative: 28 %
Lymphs Abs: 3.8 K/uL (ref 0.7–4.0)
MCH: 32.8 pg (ref 26.0–34.0)
MCHC: 34.6 g/dL (ref 30.0–36.0)
MCV: 94.8 fL (ref 80.0–100.0)
Monocytes Absolute: 1 K/uL (ref 0.1–1.0)
Monocytes Relative: 7 %
Neutro Abs: 8.4 K/uL — ABNORMAL HIGH (ref 1.7–7.7)
Neutrophils Relative %: 62 %
Platelet Count: 266 K/uL (ref 150–400)
RBC: 4 MIL/uL (ref 3.87–5.11)
RDW: 14.1 % (ref 11.5–15.5)
WBC Count: 13.6 K/uL — ABNORMAL HIGH (ref 4.0–10.5)
nRBC: 0 % (ref 0.0–0.2)

## 2024-07-01 LAB — LACTATE DEHYDROGENASE: LDH: 208 U/L — ABNORMAL HIGH (ref 98–192)

## 2024-07-01 LAB — TSH: TSH: 2.31 u[IU]/mL (ref 0.350–4.500)

## 2024-07-01 NOTE — Progress Notes (Signed)
 Hematology and Oncology Follow Up Visit  Diana Odom [id number] [birth year] 48 y.o. 07/01/2024   Principle Diagnosis:  Recurrent melanoma-BRAF (+) Severe immune mediated dermatologic toxicity  Current Therapy:   Mekinist /Tafinlar -start on 12/11/2023 -on hold starting 07/18/2024     Interim History:  Diana Odom is back for follow-up.  She is not feeling all that well.  She is having a lot of diarrhea.  I suppose this may be from the Mekinist /Tafinlar .  She will stop this.  She also has a lot of wheezing.  I know she has asthma.  I know she has a pulmonologist.  She may need to see the pulmonologist.  I will set her up with a chest x-ray today.  She is still on steroids.  This is for the dermatologic toxicity that she incurred from treatment for her melanoma.  She has horrible tongue lesions still.  Again she goes back to see [facility] I think at the end of the month.  She has had no fever.  She has had no bleeding.  There has been no leg swelling.  She has had no swollen lymph nodes.  Overall, I would have to say that her performance status is probably ECOG 2.    Medications:  Current Outpatient Medications:    lisinopril (ZESTRIL) 40 MG tablet, Take 40 mg by mouth daily., Disp: , Rfl:    albuterol  (VENTOLIN  HFA) 108 (90 Base) MCG/ACT inhaler, Inhale 2 puffs into the lungs every 6 (six) hours as needed for wheezing or shortness of breath., Disp: 8 g, Rfl: 2   ARIPiprazole (ABILIFY) 2 MG tablet, Take 2 mg by mouth daily., Disp: , Rfl:    atorvastatin (LIPITOR) 10 MG tablet, Take 10 mg by mouth at bedtime., Disp: , Rfl:    BD PEN NEEDLE MICRO U/F 32G X 6 MM MISC, SMARTSIG:1 pen needle SUB-Q Every Other Day, Disp: , Rfl:    Continuous Glucose Sensor (DEXCOM G7 SENSOR) MISC, 1 (ONE) EACH EVERY 10 DAYS, Disp: , Rfl:    dabrafenib mesylate  (TAFINLAR ) 75 MG capsule, Take 1 capsule (75 mg total) by mouth 2 (two) times daily. Take on an empty stomach 1 hour before or 2 hours after  meals., Disp: 120 capsule, Rfl: 6   diphenoxylate -atropine  (LOMOTIL ) 2.5-0.025 MG tablet, Take 2 tablets by mouth 4 (four) times daily as needed for diarrhea or loose stools., Disp: 100 tablet, Rfl: 0   FIASP FLEXTOUCH 100 UNIT/ML FlexTouch Pen, , Disp: , Rfl:    fluconazole  (DIFLUCAN ) 100 MG tablet, Take 1 tablet (100 mg total) by mouth daily., Disp: 30 tablet, Rfl: 4   FLUoxetine (PROZAC) 40 MG capsule, Take 40 mg by mouth daily., Disp: , Rfl:    folic acid  (FOLVITE ) 1 MG tablet, Take 1 tablet by mouth daily., Disp: , Rfl:    hydrochlorothiazide (HYDRODIURIL) 25 MG tablet, Take by mouth daily., Disp: , Rfl:    hydrOXYzine  (ATARAX ) 25 MG tablet, Take 1 tablet (25 mg total) by mouth every 4 (four) hours as needed., Disp: 60 tablet, Rfl: 5   metFORMIN  (GLUCOPHAGE ) 1000 MG tablet, Take 1 tablet (1,000 mg total) by mouth 2 (two) times daily with a meal., Disp: 60 tablet, Rfl: 1   methotrexate (RHEUMATREX) 2.5 MG tablet, Take 2.5 mg by mouth once a week. 12/04/2023 Has not started yet. (Patient taking differently: Take 10 mg by mouth once a week. 12/04/2023 Has not started yet.), Disp: , Rfl:    metoprolol succinate (TOPROL-XL) 25 MG 24 hr tablet, Take  by mouth daily., Disp: , Rfl:    montelukast (SINGULAIR) 10 MG tablet, Take 10 mg by mouth daily., Disp: , Rfl:    NOVOLOG FLEXPEN 100 UNIT/ML FlexPen, Inject into the skin as directed. Sliding scale, Disp: , Rfl:    nystatin cream (MYCOSTATIN), Apply 1 Application topically 2 (two) times daily. (Patient not taking: Reported on 05/27/2024), Disp: , Rfl:    oxyCODONE -acetaminophen  (PERCOCET) 7.5-325 MG tablet, Take 1 tablet by mouth every 8 (eight) hours as needed for severe pain (pain score 7-10)., Disp: 60 tablet, Rfl: 0   OZEMPIC, 2 MG/DOSE, 8 MG/3ML SOPN, Inject 2 mg into the skin once a week., Disp: , Rfl:    potassium chloride  (MICRO-K ) 10 MEQ CR capsule, Take 10 mEq by mouth 2 (two) times daily. (Patient taking differently: Take 10 mEq by mouth 2 (two)  times daily as needed.), Disp: , Rfl:    predniSONE  (DELTASONE ) 10 MG tablet, Take 40 mg by mouth daily with breakfast., Disp: , Rfl:    riTUXimab  (RITUXAN IV), Inject 1,000 mg into the vein every 14 (fourteen) days. (Patient not taking: Reported on 05/27/2024), Disp: , Rfl:    tacrolimus (PROGRAF) 1 MG capsule, 12/04/2023 States is not taken d/t pharmacy not filling. (Patient not taking: Reported on 05/27/2024), Disp: , Rfl:    trametinib  dimethyl sulfoxide  (MEKINIST ) 0.5 MG tablet, Take 2 tablets (1 mg total) by mouth daily. Take 1 hour before or 2 hours after a meal., Disp: 60 tablet, Rfl: 6   TRESIBA FLEXTOUCH 100 UNIT/ML FlexTouch Pen, Inject 10 Units into the skin at bedtime., Disp: , Rfl:   Allergies:  Allergies  Allergen Reactions   Pembrolizumab  Other (See Comments)    Blisters which became open wounds    Past Medical History, Surgical history, Social history, and Family History were reviewed and updated.  Review of Systems: Review of Systems  Constitutional:  Positive for fatigue.  HENT:  Negative.    Eyes: Negative.   Respiratory: Negative.    Cardiovascular:  Positive for leg swelling.  Gastrointestinal: Negative.   Endocrine: Negative.   Musculoskeletal:  Positive for flank pain, gait problem and myalgias.  Skin:  Positive for rash and wound.  Neurological:  Positive for gait problem.  Hematological: Negative.   Psychiatric/Behavioral: Negative.      Physical Exam:  Vital signs show temperature of 98.3.  Pulse 71.  Blood pressure 118/77.  Weight is 279 pounds.     Wt Readings from Last 3 Encounters:  07/01/24 279 lb (126.6 kg)  05/27/24 278 lb 4.8 oz (126.2 kg)  04/02/24 287 lb (130.2 kg)    Physical Exam Vitals reviewed.  HENT:     Head: Normocephalic and atraumatic.     Mouth/Throat:     Comments: She has ulcerations on her tongue. Eyes:     Pupils: Pupils are equal, round, and reactive to light.  Cardiovascular:     Rate and Rhythm: Normal rate and  regular rhythm.     Heart sounds: Normal heart sounds.  Pulmonary:     Comments: Lungs show a lot of wheezes bilaterally.  She has decent air movement.  She has rhonchi.  She has some rales. Abdominal:     General: Bowel sounds are normal.     Palpations: Abdomen is soft.  Musculoskeletal:        General: No tenderness or deformity. Normal range of motion.     Cervical back: Normal range of motion.  Lymphadenopathy:     Cervical: No  cervical adenopathy.  Skin:    General: Skin is warm and dry.     Findings: No erythema or rash.     Comments: Skin exam does show these papular lesions that are healing up.  They are on her extremities.  Neurological:     Mental Status: She is alert and oriented to person, place, and time.  Psychiatric:        Behavior: Behavior normal.        Thought Content: Thought content normal.        Judgment: Judgment normal.      Lab Results  Component Value Date   WBC 13.6 (H) 07/01/2024   HGB 13.1 07/01/2024   HCT 37.9 07/01/2024   MCV 94.8 07/01/2024   PLT 266 07/01/2024     Chemistry      Component Value Date/Time   NA 140 07/01/2024 0933   K 3.4 (L) 07/01/2024 0933   CL 101 07/01/2024 0933   CO2 26 07/01/2024 0933   BUN 19 07/01/2024 0933   CREATININE 0.83 07/01/2024 0933      Component Value Date/Time   CALCIUM 9.4 07/01/2024 0933   ALKPHOS 68 07/01/2024 0933   AST 14 (L) 07/01/2024 0933   ALT 19 07/01/2024 0933   BILITOT 0.5 07/01/2024 0933      Impression and Plan: Ms. Decarli is a very nice 48 year old white female.  She has a history of recurrent melanoma.  We have her now on targeted therapy.  This is clearly working.  However, I think she is having toxicity.  Again we will stop the Mekinist /Tafinlar  for right now.  Is possible she may need to see Gastroenterology.  She never has seen a Solicitor.  She actually has seen one in the past but does not want to go back to see him.  We will see about a chest x-ray.   Again she has a lot of wheezing.   Hopefully, she will start feeling better.  I just feel bad that she is having all these problems.  She had COVID I think recently.  She really had a hard time with this.  I am sure that this probably is somehow causing the issues with her breathing.    Maude JONELLE Crease, MD 10/13/202510:49 AM

## 2024-07-01 NOTE — Patient Instructions (Signed)

## 2024-07-02 ENCOUNTER — Encounter: Payer: Self-pay | Admitting: *Deleted

## 2024-07-02 NOTE — Progress Notes (Unsigned)
 Patient is having increased symptoms which may be related to her treatment. Dr Timmy will hold for now.   Oncology Nurse Navigator Documentation     07/02/2024    8:45 AM  Oncology Nurse Navigator Flowsheets  Navigator Follow Up Date: 08/12/2024  Navigator Follow Up Reason: Follow-up Appointment  Navigator Location [facility]  Navigator Encounter Type Appt/Treatment Plan Review  Patient Visit Type MedOnc  Treatment Phase Active Tx  Barriers/Navigation Needs No Barriers At This Time  Interventions None Required  Acuity Level 1-No Barriers  Support Groups/Services Friends and Family  Time Spent with Patient 15

## 2024-07-30 ENCOUNTER — Encounter: Payer: Self-pay | Admitting: *Deleted

## 2024-07-30 ENCOUNTER — Other Ambulatory Visit: Payer: Self-pay | Admitting: *Deleted

## 2024-07-30 DIAGNOSIS — C4371 Malignant melanoma of right lower limb, including hip: Secondary | ICD-10-CM

## 2024-07-30 MED ORDER — OXYCODONE-ACETAMINOPHEN 7.5-325 MG PO TABS: 1.0000 | ORAL_TABLET | Freq: Three times a day (TID) | ORAL | 0 refills | Status: DC | PRN

## 2024-08-12 ENCOUNTER — Inpatient Hospital Stay: Admitting: Hematology & Oncology

## 2024-08-12 ENCOUNTER — Inpatient Hospital Stay: Attending: Oncology

## 2024-08-12 ENCOUNTER — Inpatient Hospital Stay

## 2024-08-14 ENCOUNTER — Telehealth: Payer: Self-pay | Admitting: Hematology & Oncology

## 2024-08-14 NOTE — Telephone Encounter (Signed)
 Called to reschedule missed appts. Unable to LVM to return call for scheduling.

## 2024-08-23 ENCOUNTER — Telehealth: Payer: Self-pay | Admitting: Hematology & Oncology

## 2024-08-23 NOTE — Telephone Encounter (Signed)
 Called to reschedule missed appts per inbasket. LVM to return call for scheduling.

## 2024-08-26 ENCOUNTER — Encounter: Payer: Self-pay | Admitting: *Deleted

## 2024-08-26 ENCOUNTER — Other Ambulatory Visit: Payer: Self-pay | Admitting: *Deleted

## 2024-08-26 ENCOUNTER — Telehealth: Payer: Self-pay | Admitting: Hematology & Oncology

## 2024-08-26 DIAGNOSIS — C4371 Malignant melanoma of right lower limb, including hip: Secondary | ICD-10-CM

## 2024-08-26 MED ORDER — OXYCODONE-ACETAMINOPHEN 7.5-325 MG PO TABS: 1.0000 | ORAL_TABLET | Freq: Three times a day (TID) | ORAL | 0 refills | Status: DC | PRN

## 2024-08-26 NOTE — Telephone Encounter (Signed)
 Lvm for patient to return call for rescheduling missed offive vist from 08/12/24.

## 2024-09-16 ENCOUNTER — Encounter: Payer: Self-pay | Admitting: *Deleted

## 2024-09-16 ENCOUNTER — Inpatient Hospital Stay: Attending: Oncology

## 2024-09-16 ENCOUNTER — Inpatient Hospital Stay

## 2024-09-16 ENCOUNTER — Inpatient Hospital Stay: Admitting: Hematology & Oncology

## 2024-09-16 VITALS — BP 139/77 | HR 80 | Temp 98.2°F | Resp 18 | Wt 283.5 lb

## 2024-09-16 DIAGNOSIS — K1239 Other oral mucositis (ulcerative): Secondary | ICD-10-CM | POA: Insufficient documentation

## 2024-09-16 DIAGNOSIS — C4371 Malignant melanoma of right lower limb, including hip: Secondary | ICD-10-CM | POA: Insufficient documentation

## 2024-09-16 LAB — CBC WITH DIFFERENTIAL (CANCER CENTER ONLY)
Abs Immature Granulocytes: 0.08 K/uL — ABNORMAL HIGH (ref 0.00–0.07)
Basophils Absolute: 0.1 K/uL (ref 0.0–0.1)
Basophils Relative: 1 %
Eosinophils Absolute: 0.1 K/uL (ref 0.0–0.5)
Eosinophils Relative: 1 %
HCT: 38.4 % (ref 36.0–46.0)
Hemoglobin: 13.2 g/dL (ref 12.0–15.0)
Immature Granulocytes: 1 %
Lymphocytes Relative: 26 %
Lymphs Abs: 2.8 K/uL (ref 0.7–4.0)
MCH: 31.4 pg (ref 26.0–34.0)
MCHC: 34.4 g/dL (ref 30.0–36.0)
MCV: 91.2 fL (ref 80.0–100.0)
Monocytes Absolute: 0.8 K/uL (ref 0.1–1.0)
Monocytes Relative: 8 %
Neutro Abs: 6.8 K/uL (ref 1.7–7.7)
Neutrophils Relative %: 63 %
Platelet Count: 251 K/uL (ref 150–400)
RBC: 4.21 MIL/uL (ref 3.87–5.11)
RDW: 13.5 % (ref 11.5–15.5)
WBC Count: 10.6 K/uL — ABNORMAL HIGH (ref 4.0–10.5)
nRBC: 0 % (ref 0.0–0.2)

## 2024-09-16 LAB — CMP (CANCER CENTER ONLY)
ALT: 17 U/L (ref 0–44)
AST: 14 U/L — ABNORMAL LOW (ref 15–41)
Albumin: 3.8 g/dL (ref 3.5–5.0)
Alkaline Phosphatase: 82 U/L (ref 38–126)
Anion gap: 14 (ref 5–15)
BUN: 21 mg/dL — ABNORMAL HIGH (ref 6–20)
CO2: 25 mmol/L (ref 22–32)
Calcium: 9.3 mg/dL (ref 8.9–10.3)
Chloride: 97 mmol/L — ABNORMAL LOW (ref 98–111)
Creatinine: 0.76 mg/dL (ref 0.44–1.00)
GFR, Estimated: >60 mL/min
Glucose, Bld: 257 mg/dL — ABNORMAL HIGH (ref 70–99)
Potassium: 3.5 mmol/L (ref 3.5–5.1)
Sodium: 136 mmol/L (ref 135–145)
Total Bilirubin: 0.7 mg/dL (ref 0.0–1.2)
Total Protein: 6.2 g/dL — ABNORMAL LOW (ref 6.5–8.1)

## 2024-09-16 LAB — LACTATE DEHYDROGENASE: LDH: 190 U/L (ref 105–235)

## 2024-09-16 NOTE — Patient Instructions (Signed)

## 2024-09-16 NOTE — Progress Notes (Signed)
 " Hematology and Oncology Follow Up Visit  Diana Odom [id number] April 25, 1976 48 y.o. 09/16/2024   Principle Diagnosis:  Recurrent melanoma-BRAF (+) Severe immune mediated dermatologic toxicity  Current Therapy:   Mekinist /Tafinlar -start on 12/11/2023 -on hold starting 07/18/2024     Interim History:  Diana Odom is back for follow-up.  We last saw her back in October.  She been doing a lot of appointments up in [place].  She has been seeing dermatology because of the horrible mucositis that she developed in her mouth.  This is all immune mediated.  She is on a topical cream right now.  She is on systemic steroids.  She has been tapered down incredibly slowly.  She thinks that the tongue ulcer is improving.  I saw from her know that the she might go on Rituxan.  I think this would be quite interesting.  She does feel somewhat tired.  She has been dealing with this for over a year.  I have to give her credit for doing as well as she has been doing.  As far as the melanoma is concerned, she has been off Mekinist /Tafinlar  for about 2 months.  We will set her up with another PET scan to see how her thing looks.  She has not noted any change in bowel or bladder habits.  She has had no unusual rashes.  She has had no cough.  She has had no fever.  She has had no bleeding.  There has been no change in bowel or bladder habits.  Overall, I will say that her performance status is probably ECOG 1.    Medications:  Current Outpatient Medications:    albuterol  (VENTOLIN  HFA) 108 (90 Base) MCG/ACT inhaler, Inhale 2 puffs into the lungs every 6 (six) hours as needed for wheezing or shortness of breath., Disp: 8 g, Rfl: 2   ARIPiprazole (ABILIFY) 2 MG tablet, Take 2 mg by mouth daily., Disp: , Rfl:    atorvastatin (LIPITOR) 10 MG tablet, Take 10 mg by mouth at bedtime., Disp: , Rfl:    BD PEN NEEDLE MICRO U/F 32G X 6 MM MISC, SMARTSIG:1 pen needle SUB-Q Every Other Day, Disp: , Rfl:     Continuous Glucose Sensor (DEXCOM G7 SENSOR) MISC, 1 (ONE) EACH EVERY 10 DAYS, Disp: , Rfl:    dabrafenib mesylate  (TAFINLAR ) 75 MG capsule, Take 1 capsule (75 mg total) by mouth 2 (two) times daily. Take on an empty stomach 1 hour before or 2 hours after meals., Disp: 120 capsule, Rfl: 6   diphenoxylate -atropine  (LOMOTIL ) 2.5-0.025 MG tablet, Take 2 tablets by mouth 4 (four) times daily as needed for diarrhea or loose stools., Disp: 100 tablet, Rfl: 0   FIASP FLEXTOUCH 100 UNIT/ML FlexTouch Pen, , Disp: , Rfl:    fluconazole  (DIFLUCAN ) 100 MG tablet, Take 1 tablet (100 mg total) by mouth daily., Disp: 30 tablet, Rfl: 4   FLUoxetine (PROZAC) 40 MG capsule, Take 40 mg by mouth daily., Disp: , Rfl:    folic acid  (FOLVITE ) 1 MG tablet, Take 1 tablet by mouth daily., Disp: , Rfl:    hydrochlorothiazide (HYDRODIURIL) 25 MG tablet, Take by mouth daily., Disp: , Rfl:    hydrOXYzine  (ATARAX ) 25 MG tablet, Take 1 tablet (25 mg total) by mouth every 4 (four) hours as needed., Disp: 60 tablet, Rfl: 5   lisinopril (ZESTRIL) 40 MG tablet, Take 40 mg by mouth daily., Disp: , Rfl:    metFORMIN  (GLUCOPHAGE ) 1000 MG tablet, Take 1 tablet (1,000  mg total) by mouth 2 (two) times daily with a meal., Disp: 60 tablet, Rfl: 1   methotrexate (RHEUMATREX) 2.5 MG tablet, Take 2.5 mg by mouth once a week. 12/04/2023 Has not started yet. (Patient taking differently: Take 10 mg by mouth once a week. 12/04/2023 Has not started yet.), Disp: , Rfl:    metoprolol succinate (TOPROL-XL) 25 MG 24 hr tablet, Take by mouth daily., Disp: , Rfl:    montelukast (SINGULAIR) 10 MG tablet, Take 10 mg by mouth daily., Disp: , Rfl:    NOVOLOG FLEXPEN 100 UNIT/ML FlexPen, Inject into the skin as directed. Sliding scale, Disp: , Rfl:    oxyCODONE -acetaminophen  (PERCOCET) 7.5-325 MG tablet, Take 1 tablet by mouth every 8 (eight) hours as needed for severe pain (pain score 7-10)., Disp: 60 tablet, Rfl: 0   potassium chloride  (MICRO-K ) 10 MEQ CR  capsule, Take 10 mEq by mouth 2 (two) times daily. (Patient taking differently: Take 10 mEq by mouth 2 (two) times daily as needed.), Disp: , Rfl:    predniSONE  (DELTASONE ) 10 MG tablet, Take 40 mg by mouth daily with breakfast., Disp: , Rfl:    tacrolimus (PROGRAF) 1 MG capsule, 12/04/2023 States is not taken d/t pharmacy not filling., Disp: , Rfl:    trametinib  dimethyl sulfoxide  (MEKINIST ) 0.5 MG tablet, Take 2 tablets (1 mg total) by mouth daily. Take 1 hour before or 2 hours after a meal., Disp: 60 tablet, Rfl: 6   TRESIBA FLEXTOUCH 100 UNIT/ML FlexTouch Pen, Inject 10 Units into the skin at bedtime., Disp: , Rfl:    OZEMPIC, 2 MG/DOSE, 8 MG/3ML SOPN, Inject 2 mg into the skin once a week. (Patient not taking: Reported on 09/16/2024), Disp: , Rfl:   Allergies:  Allergies  Allergen Reactions   Pembrolizumab  Other (See Comments)    Blisters which became open wounds    Past Medical History, Surgical history, Social history, and Family History were reviewed and updated.  Review of Systems: Review of Systems  Constitutional:  Positive for fatigue.  HENT:  Negative.    Eyes: Negative.   Respiratory: Negative.    Cardiovascular:  Positive for leg swelling.  Gastrointestinal: Negative.   Endocrine: Negative.   Musculoskeletal:  Positive for flank pain, gait problem and myalgias.  Skin:  Positive for rash and wound.  Neurological:  Positive for gait problem.  Hematological: Negative.   Psychiatric/Behavioral: Negative.      Physical Exam:  Vital signs show temperature of 98.2.  Pulse 80.  Blood pressure 139/77.  Weight is 283 pounds.      Wt Readings from Last 3 Encounters:  09/16/24 283 lb 8 oz (128.6 kg)  07/01/24 279 lb (126.6 kg)  05/27/24 278 lb 4.8 oz (126.2 kg)    Physical Exam Vitals reviewed.  HENT:     Head: Normocephalic and atraumatic.     Mouth/Throat:     Comments: She has ulcerations on her tongue.  There may be a little bit improved.  She has a large ulcer  in the center of her tongue which again looks like it might be a little bit smaller in size.  I do not see anything on the buccal mucosa. Eyes:     Pupils: Pupils are equal, round, and reactive to light.  Cardiovascular:     Rate and Rhythm: Normal rate and regular rhythm.     Heart sounds: Normal heart sounds.  Pulmonary:     Comments: Lungs show a lot of wheezes bilaterally.  She has decent  air movement.  She has rhonchi.  She has some rales. Abdominal:     General: Bowel sounds are normal.     Palpations: Abdomen is soft.  Musculoskeletal:        General: No tenderness or deformity. Normal range of motion.     Cervical back: Normal range of motion.  Lymphadenopathy:     Cervical: No cervical adenopathy.  Skin:    General: Skin is warm and dry.     Findings: No erythema or rash.     Comments: Skin exam does show these papular lesions that are healing up.  They are on her extremities.  Neurological:     Mental Status: She is alert and oriented to person, place, and time.  Psychiatric:        Behavior: Behavior normal.        Thought Content: Thought content normal.        Judgment: Judgment normal.      Lab Results  Component Value Date   WBC 10.6 (H) 09/16/2024   HGB 13.2 09/16/2024   HCT 38.4 09/16/2024   MCV 91.2 09/16/2024   PLT 251 09/16/2024     Chemistry      Component Value Date/Time   NA 140 07/01/2024 0933   K 3.4 (L) 07/01/2024 0933   CL 101 07/01/2024 0933   CO2 26 07/01/2024 0933   BUN 19 07/01/2024 0933   CREATININE 0.83 07/01/2024 0933      Component Value Date/Time   CALCIUM 9.4 07/01/2024 0933   ALKPHOS 68 07/01/2024 0933   AST 14 (L) 07/01/2024 0933   ALT 19 07/01/2024 0933   BILITOT 0.5 07/01/2024 0933      Impression and Plan: Ms. Angell is a very nice 48 year old white female.  She has a history of recurrent melanoma.  We have had her on targeted therapy.  We stopped this back in October because of diarrhea.  She still have a little  bit of diarrhea so much or if the Mekinist /Tafinlar  has been causing her problems..  I will set her up with a PET scan.  We will see if this will show us  anything with respect to the melanoma.  Hopefully, we do not see any evidence of recurrent melanoma.  Hopefully she can just deal with the immune mediated mucositis.  Thankfully, her labs look pretty good.  Will see about the PET scan in about 3 to 4 weeks.  I will then plan to get her back in 6 weeks.   Maude JONELLE Crease, MD 12/29/20253:36 PM "

## 2024-09-17 NOTE — Progress Notes (Signed)
 Patient has had her melanoma treatment held for 2 months now. 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 She will continue to hold her treatment. She needs a PET for restaging. Scheduled for 10/17/2024.  Oncology Nurse Navigator Documentation     09/16/2024    3:00 PM  Oncology Nurse Navigator Flowsheets  Navigator Follow Up Date: 10/17/2024  Navigator Follow Up Reason: Scan Review  Navigator Location [facility]  Navigator Encounter Type Follow-up Appt  Patient Visit Type MedOnc  Treatment Phase Active Tx  Barriers/Navigation Needs No Barriers At This Time  Interventions None Required  Acuity Level 1-No Barriers  Support Groups/Services Friends and Family  Time Spent with Patient 15

## 2024-10-02 ENCOUNTER — Encounter: Payer: Self-pay | Admitting: *Deleted

## 2024-10-02 ENCOUNTER — Other Ambulatory Visit: Payer: Self-pay

## 2024-10-02 ENCOUNTER — Other Ambulatory Visit (HOSPITAL_BASED_OUTPATIENT_CLINIC_OR_DEPARTMENT_OTHER): Payer: Self-pay | Admitting: Family Medicine

## 2024-10-02 DIAGNOSIS — Z1231 Encounter for screening mammogram for malignant neoplasm of breast: Secondary | ICD-10-CM

## 2024-10-02 DIAGNOSIS — C4371 Malignant melanoma of right lower limb, including hip: Secondary | ICD-10-CM

## 2024-10-02 MED ORDER — OXYCODONE-ACETAMINOPHEN 7.5-325 MG PO TABS: 1.0000 | ORAL_TABLET | Freq: Three times a day (TID) | ORAL | 0 refills | Status: DC | PRN

## 2024-10-17 ENCOUNTER — Encounter (HOSPITAL_COMMUNITY): Payer: Self-pay | Admitting: Radiology

## 2024-10-17 ENCOUNTER — Ambulatory Visit (HOSPITAL_COMMUNITY)
Admission: RE | Admit: 2024-10-17 | Discharge: 2024-10-17 | Disposition: A | Discharge status: Home / Self Care | Source: Ambulatory Visit | Attending: Hematology & Oncology | Admitting: Hematology & Oncology

## 2024-10-17 DIAGNOSIS — C4371 Malignant melanoma of right lower limb, including hip: Secondary | ICD-10-CM | POA: Insufficient documentation

## 2024-10-17 LAB — GLUCOSE, CAPILLARY: Glucose-Capillary: 117 mg/dL — ABNORMAL HIGH (ref 70–99)

## 2024-10-17 MED ORDER — FLUDEOXYGLUCOSE F - 18 (FDG) INJECTION
14.0000 | Freq: Once | INTRAVENOUS | Status: AC | PRN
  Administered 2024-10-17: 14 via INTRAVENOUS

## 2024-10-21 ENCOUNTER — Ambulatory Visit: Payer: Self-pay | Admitting: Hematology & Oncology

## 2024-10-22 ENCOUNTER — Encounter: Payer: Self-pay | Admitting: *Deleted

## 2024-10-22 ENCOUNTER — Other Ambulatory Visit: Payer: Self-pay | Admitting: *Deleted

## 2024-10-22 DIAGNOSIS — C4371 Malignant melanoma of right lower limb, including hip: Secondary | ICD-10-CM

## 2024-10-22 MED ORDER — OXYCODONE-ACETAMINOPHEN 7.5-325 MG PO TABS: 1.0000 | ORAL_TABLET | Freq: Three times a day (TID) | ORAL | 0 refills | Status: AC | PRN

## 2024-10-30 ENCOUNTER — Inpatient Hospital Stay

## 2024-10-30 ENCOUNTER — Inpatient Hospital Stay: Admitting: Hematology & Oncology
